# Patient Record
Sex: Female | Born: 1960
Health system: Southern US, Community
[De-identification: ages and names within clinical notes are randomized; demographics above are authoritative.]

## PROBLEM LIST (undated history)

## (undated) DIAGNOSIS — D649 Anemia, unspecified: Secondary | ICD-10-CM

## (undated) DIAGNOSIS — K829 Disease of gallbladder, unspecified: Secondary | ICD-10-CM

## (undated) DIAGNOSIS — E1165 Type 2 diabetes mellitus with hyperglycemia: Principal | ICD-10-CM

## (undated) DIAGNOSIS — T7840XA Allergy, unspecified, initial encounter: Secondary | ICD-10-CM

## (undated) DIAGNOSIS — I10 Essential (primary) hypertension: Secondary | ICD-10-CM

## (undated) DIAGNOSIS — E8881 Metabolic syndrome: Secondary | ICD-10-CM

## (undated) DIAGNOSIS — Q393 Congenital stenosis and stricture of esophagus: Secondary | ICD-10-CM

## (undated) DIAGNOSIS — D869 Sarcoidosis, unspecified: Secondary | ICD-10-CM

## (undated) DIAGNOSIS — G4730 Sleep apnea, unspecified: Secondary | ICD-10-CM

## (undated) DIAGNOSIS — M1990 Unspecified osteoarthritis, unspecified site: Secondary | ICD-10-CM

## (undated) DIAGNOSIS — F419 Anxiety disorder, unspecified: Secondary | ICD-10-CM

## (undated) DIAGNOSIS — R001 Bradycardia, unspecified: Secondary | ICD-10-CM

## (undated) DIAGNOSIS — E079 Disorder of thyroid, unspecified: Secondary | ICD-10-CM

## (undated) DIAGNOSIS — E119 Type 2 diabetes mellitus without complications: Secondary | ICD-10-CM

## (undated) DIAGNOSIS — R011 Cardiac murmur, unspecified: Secondary | ICD-10-CM

## (undated) DIAGNOSIS — F32A Depression, unspecified: Secondary | ICD-10-CM

## (undated) DIAGNOSIS — R51 Headache: Secondary | ICD-10-CM

## (undated) DIAGNOSIS — F329 Major depressive disorder, single episode, unspecified: Secondary | ICD-10-CM

## (undated) DIAGNOSIS — K649 Unspecified hemorrhoids: Secondary | ICD-10-CM

## (undated) DIAGNOSIS — K602 Anal fissure, unspecified: Secondary | ICD-10-CM

## (undated) DIAGNOSIS — K219 Gastro-esophageal reflux disease without esophagitis: Secondary | ICD-10-CM

## (undated) DIAGNOSIS — G709 Myoneural disorder, unspecified: Secondary | ICD-10-CM

## (undated) HISTORY — PX: ABDOMINAL HYSTERECTOMY: SHX81

## (undated) HISTORY — DX: Anxiety disorder, unspecified: F41.9

## (undated) HISTORY — DX: Cardiac murmur, unspecified: R01.1

## (undated) HISTORY — DX: Congenital stenosis and stricture of esophagus: Q39.3

## (undated) HISTORY — DX: Disease of gallbladder, unspecified: K82.9

## (undated) HISTORY — DX: Disorder of thyroid, unspecified: E07.9

## (undated) HISTORY — PX: UPPER GASTROINTESTINAL ENDOSCOPY: SHX188

## (undated) HISTORY — PX: CHOLECYSTECTOMY: SHX55

## (undated) HISTORY — DX: Bradycardia, unspecified: R00.1

## (undated) HISTORY — DX: Depression, unspecified: F32.A

## (undated) HISTORY — DX: Metabolic syndrome: E88.810

## (undated) HISTORY — DX: Anal fissure, unspecified: K60.2

## (undated) HISTORY — DX: Gastro-esophageal reflux disease without esophagitis: K21.9

## (undated) HISTORY — DX: Unspecified osteoarthritis, unspecified site: M19.90

## (undated) HISTORY — PX: OOPHORECTOMY: SHX86

## (undated) HISTORY — DX: Myoneural disorder, unspecified: G70.9

## (undated) HISTORY — DX: Sleep apnea, unspecified: G47.30

## (undated) HISTORY — DX: Allergy, unspecified, initial encounter: T78.40XA

## (undated) HISTORY — DX: Type 2 diabetes mellitus with hyperglycemia: E11.65

## (undated) HISTORY — DX: Type 2 diabetes mellitus without complications: E11.9

## (undated) HISTORY — DX: Headache: R51

## (undated) HISTORY — DX: Metabolic syndrome: E88.81

## (undated) HISTORY — DX: Sarcoidosis, unspecified: D86.9

## (undated) HISTORY — DX: Anemia, unspecified: D64.9

## (undated) HISTORY — DX: Major depressive disorder, single episode, unspecified: F32.9

## (undated) HISTORY — DX: Unspecified hemorrhoids: K64.9

## (undated) HISTORY — DX: Essential (primary) hypertension: I10

## (undated) HISTORY — PX: ESOPHAGEAL DILATION: SHX303

---

## 1998-04-17 ENCOUNTER — Emergency Department (HOSPITAL_COMMUNITY): Admission: EM | Admit: 1998-04-17 | Discharge: 1998-04-17 | Payer: Self-pay

## 1998-10-31 ENCOUNTER — Ambulatory Visit (HOSPITAL_COMMUNITY): Admission: RE | Admit: 1998-10-31 | Discharge: 1998-10-31 | Payer: Self-pay | Admitting: *Deleted

## 1998-11-07 ENCOUNTER — Ambulatory Visit (HOSPITAL_COMMUNITY): Admission: RE | Admit: 1998-11-07 | Discharge: 1998-11-07 | Payer: Self-pay | Admitting: *Deleted

## 1998-12-10 ENCOUNTER — Encounter: Payer: Self-pay | Admitting: *Deleted

## 1998-12-10 ENCOUNTER — Ambulatory Visit (HOSPITAL_COMMUNITY): Admission: RE | Admit: 1998-12-10 | Discharge: 1998-12-10 | Payer: Self-pay | Admitting: *Deleted

## 1998-12-24 ENCOUNTER — Other Ambulatory Visit: Admission: RE | Admit: 1998-12-24 | Discharge: 1998-12-24 | Payer: Self-pay | Admitting: Gynecology

## 1999-12-28 ENCOUNTER — Encounter (INDEPENDENT_AMBULATORY_CARE_PROVIDER_SITE_OTHER): Payer: Self-pay | Admitting: Specialist

## 1999-12-28 ENCOUNTER — Other Ambulatory Visit: Admission: RE | Admit: 1999-12-28 | Discharge: 1999-12-28 | Payer: Self-pay | Admitting: Gynecology

## 2000-06-21 ENCOUNTER — Emergency Department (HOSPITAL_COMMUNITY): Admission: EM | Admit: 2000-06-21 | Discharge: 2000-06-21 | Payer: Self-pay

## 2001-11-09 ENCOUNTER — Encounter: Payer: Self-pay | Admitting: Gynecology

## 2001-11-13 ENCOUNTER — Other Ambulatory Visit: Admission: RE | Admit: 2001-11-13 | Discharge: 2001-11-13 | Payer: Self-pay | Admitting: Gynecology

## 2001-11-16 ENCOUNTER — Inpatient Hospital Stay (HOSPITAL_COMMUNITY): Admission: RE | Admit: 2001-11-16 | Discharge: 2001-11-17 | Payer: Self-pay | Admitting: Gynecology

## 2001-11-16 ENCOUNTER — Encounter (INDEPENDENT_AMBULATORY_CARE_PROVIDER_SITE_OTHER): Payer: Self-pay | Admitting: Specialist

## 2003-04-24 ENCOUNTER — Emergency Department (HOSPITAL_COMMUNITY): Admission: EM | Admit: 2003-04-24 | Discharge: 2003-04-25 | Payer: Self-pay | Admitting: Emergency Medicine

## 2003-04-24 ENCOUNTER — Encounter: Payer: Self-pay | Admitting: Emergency Medicine

## 2003-04-24 ENCOUNTER — Emergency Department (HOSPITAL_COMMUNITY): Admission: EM | Admit: 2003-04-24 | Discharge: 2003-04-24 | Payer: Self-pay | Admitting: Emergency Medicine

## 2003-04-26 ENCOUNTER — Ambulatory Visit (HOSPITAL_COMMUNITY): Admission: RE | Admit: 2003-04-26 | Discharge: 2003-04-26 | Payer: Self-pay | Admitting: Urology

## 2004-08-31 DIAGNOSIS — E11319 Type 2 diabetes mellitus with unspecified diabetic retinopathy without macular edema: Secondary | ICD-10-CM | POA: Insufficient documentation

## 2005-04-20 ENCOUNTER — Ambulatory Visit: Payer: Self-pay | Admitting: Internal Medicine

## 2005-05-31 ENCOUNTER — Encounter: Admission: RE | Admit: 2005-05-31 | Discharge: 2005-08-29 | Payer: Self-pay | Admitting: Internal Medicine

## 2005-07-07 ENCOUNTER — Ambulatory Visit: Payer: Self-pay | Admitting: Internal Medicine

## 2005-07-19 ENCOUNTER — Ambulatory Visit: Payer: Self-pay | Admitting: Internal Medicine

## 2005-08-30 ENCOUNTER — Encounter: Admission: RE | Admit: 2005-08-30 | Discharge: 2005-11-28 | Payer: Self-pay | Admitting: Internal Medicine

## 2005-09-16 ENCOUNTER — Ambulatory Visit: Payer: Self-pay | Admitting: Internal Medicine

## 2005-12-27 ENCOUNTER — Encounter: Admission: RE | Admit: 2005-12-27 | Discharge: 2005-12-27 | Payer: Self-pay | Admitting: Internal Medicine

## 2006-06-26 ENCOUNTER — Emergency Department (HOSPITAL_COMMUNITY): Admission: EM | Admit: 2006-06-26 | Discharge: 2006-06-26 | Payer: Self-pay | Admitting: Emergency Medicine

## 2006-07-01 ENCOUNTER — Ambulatory Visit: Payer: Self-pay | Admitting: Internal Medicine

## 2006-08-15 ENCOUNTER — Ambulatory Visit (HOSPITAL_BASED_OUTPATIENT_CLINIC_OR_DEPARTMENT_OTHER): Admission: RE | Admit: 2006-08-15 | Discharge: 2006-08-15 | Payer: Self-pay | Admitting: Psychiatry

## 2006-08-21 ENCOUNTER — Ambulatory Visit: Payer: Self-pay | Admitting: Internal Medicine

## 2006-09-13 HISTORY — PX: COLONOSCOPY: SHX174

## 2006-10-18 ENCOUNTER — Ambulatory Visit: Payer: Self-pay | Admitting: Internal Medicine

## 2006-10-21 ENCOUNTER — Ambulatory Visit: Payer: Self-pay | Admitting: Gastroenterology

## 2006-10-26 ENCOUNTER — Ambulatory Visit: Payer: Self-pay | Admitting: Gastroenterology

## 2006-11-28 ENCOUNTER — Ambulatory Visit: Payer: Self-pay | Admitting: Gastroenterology

## 2007-02-09 DIAGNOSIS — I10 Essential (primary) hypertension: Secondary | ICD-10-CM | POA: Insufficient documentation

## 2007-02-09 DIAGNOSIS — Z9079 Acquired absence of other genital organ(s): Secondary | ICD-10-CM | POA: Insufficient documentation

## 2007-02-21 ENCOUNTER — Observation Stay (HOSPITAL_COMMUNITY): Admission: EM | Admit: 2007-02-21 | Discharge: 2007-02-22 | Payer: Self-pay | Admitting: Emergency Medicine

## 2007-02-21 ENCOUNTER — Encounter: Payer: Self-pay | Admitting: Internal Medicine

## 2007-02-22 ENCOUNTER — Ambulatory Visit: Payer: Self-pay | Admitting: Internal Medicine

## 2007-02-22 ENCOUNTER — Other Ambulatory Visit: Payer: Self-pay | Admitting: Internal Medicine

## 2007-02-22 ENCOUNTER — Encounter: Payer: Self-pay | Admitting: Internal Medicine

## 2007-03-22 ENCOUNTER — Ambulatory Visit: Payer: Self-pay | Admitting: Gastroenterology

## 2007-03-23 ENCOUNTER — Telehealth (INDEPENDENT_AMBULATORY_CARE_PROVIDER_SITE_OTHER): Payer: Self-pay | Admitting: *Deleted

## 2007-04-11 ENCOUNTER — Ambulatory Visit: Payer: Self-pay | Admitting: Internal Medicine

## 2007-05-25 ENCOUNTER — Ambulatory Visit: Payer: Self-pay | Admitting: Internal Medicine

## 2007-05-28 LAB — CONVERTED CEMR LAB
AST: 17 units/L (ref 0–37)
Basophils Absolute: 0 10*3/uL (ref 0.0–0.1)
Bilirubin, Direct: 0.1 mg/dL (ref 0.0–0.3)
CO2: 25 meq/L (ref 19–32)
Chloride: 105 meq/L (ref 96–112)
Cholesterol: 155 mg/dL (ref 0–200)
Creatinine, Ser: 1 mg/dL (ref 0.4–1.2)
Creatinine,U: 239.8 mg/dL
Eosinophils Relative: 2.7 % (ref 0.0–5.0)
Glucose, Bld: 105 mg/dL — ABNORMAL HIGH (ref 70–99)
HCT: 36.1 % (ref 36.0–46.0)
Hemoglobin: 12.5 g/dL (ref 12.0–15.0)
LDL Cholesterol: 97 mg/dL (ref 0–99)
MCHC: 34.7 g/dL (ref 30.0–36.0)
Microalb, Ur: 0.7 mg/dL (ref 0.0–1.9)
Monocytes Absolute: 0.7 10*3/uL (ref 0.2–0.7)
Neutrophils Relative %: 62.6 % (ref 43.0–77.0)
Potassium: 3 meq/L — ABNORMAL LOW (ref 3.5–5.1)
RBC: 4.18 M/uL (ref 3.87–5.11)
RDW: 12.6 % (ref 11.5–14.6)
Sodium: 138 meq/L (ref 135–145)
TSH: 1.42 microintl units/mL (ref 0.35–5.50)
Total Bilirubin: 0.6 mg/dL (ref 0.3–1.2)
Total CHOL/HDL Ratio: 4.2
Total Protein: 7.7 g/dL (ref 6.0–8.3)
WBC: 10.2 10*3/uL (ref 4.5–10.5)

## 2007-05-30 ENCOUNTER — Ambulatory Visit: Payer: Self-pay | Admitting: Internal Medicine

## 2007-05-30 DIAGNOSIS — M2550 Pain in unspecified joint: Secondary | ICD-10-CM | POA: Insufficient documentation

## 2007-06-30 ENCOUNTER — Telehealth (INDEPENDENT_AMBULATORY_CARE_PROVIDER_SITE_OTHER): Payer: Self-pay | Admitting: *Deleted

## 2007-07-03 ENCOUNTER — Telehealth (INDEPENDENT_AMBULATORY_CARE_PROVIDER_SITE_OTHER): Payer: Self-pay | Admitting: *Deleted

## 2007-07-12 ENCOUNTER — Telehealth (INDEPENDENT_AMBULATORY_CARE_PROVIDER_SITE_OTHER): Payer: Self-pay | Admitting: *Deleted

## 2007-09-01 ENCOUNTER — Ambulatory Visit: Payer: Self-pay | Admitting: Internal Medicine

## 2007-09-01 DIAGNOSIS — IMO0001 Reserved for inherently not codable concepts without codable children: Secondary | ICD-10-CM

## 2007-09-01 DIAGNOSIS — R5381 Other malaise: Secondary | ICD-10-CM | POA: Insufficient documentation

## 2007-09-01 DIAGNOSIS — R5383 Other fatigue: Secondary | ICD-10-CM

## 2007-09-01 HISTORY — DX: Reserved for inherently not codable concepts without codable children: IMO0001

## 2007-09-09 LAB — CONVERTED CEMR LAB
Basophils Relative: 0.9 % (ref 0.0–1.0)
Eosinophils Absolute: 0.3 10*3/uL (ref 0.0–0.6)
Hemoglobin: 13 g/dL (ref 12.0–15.0)
Hgb A1c MFr Bld: 5.8 % (ref 4.6–6.0)
Lymphocytes Relative: 31.9 % (ref 12.0–46.0)
MCHC: 34.6 g/dL (ref 30.0–36.0)
MCV: 87.6 fL (ref 78.0–100.0)
Monocytes Absolute: 0.5 10*3/uL (ref 0.2–0.7)
Monocytes Relative: 5.3 % (ref 3.0–11.0)
Neutro Abs: 5.4 10*3/uL (ref 1.4–7.7)
Platelets: 392 10*3/uL (ref 150–400)

## 2007-09-11 ENCOUNTER — Encounter (INDEPENDENT_AMBULATORY_CARE_PROVIDER_SITE_OTHER): Payer: Self-pay | Admitting: *Deleted

## 2007-09-18 ENCOUNTER — Ambulatory Visit: Payer: Self-pay | Admitting: Vascular Surgery

## 2007-09-18 ENCOUNTER — Ambulatory Visit: Admission: RE | Admit: 2007-09-18 | Discharge: 2007-09-18 | Payer: Self-pay | Admitting: Family Medicine

## 2007-10-09 ENCOUNTER — Ambulatory Visit: Payer: Self-pay | Admitting: Internal Medicine

## 2008-01-13 DIAGNOSIS — Z8679 Personal history of other diseases of the circulatory system: Secondary | ICD-10-CM | POA: Insufficient documentation

## 2008-01-13 DIAGNOSIS — Z87442 Personal history of urinary calculi: Secondary | ICD-10-CM | POA: Insufficient documentation

## 2008-01-13 DIAGNOSIS — K602 Anal fissure, unspecified: Secondary | ICD-10-CM | POA: Insufficient documentation

## 2008-01-13 DIAGNOSIS — K219 Gastro-esophageal reflux disease without esophagitis: Secondary | ICD-10-CM | POA: Insufficient documentation

## 2008-01-13 DIAGNOSIS — G43909 Migraine, unspecified, not intractable, without status migrainosus: Secondary | ICD-10-CM | POA: Insufficient documentation

## 2008-04-09 ENCOUNTER — Emergency Department (HOSPITAL_COMMUNITY): Admission: EM | Admit: 2008-04-09 | Discharge: 2008-04-09 | Payer: Self-pay | Admitting: Emergency Medicine

## 2008-04-09 ENCOUNTER — Encounter: Payer: Self-pay | Admitting: Internal Medicine

## 2008-04-10 ENCOUNTER — Ambulatory Visit: Payer: Self-pay | Admitting: Internal Medicine

## 2008-04-10 DIAGNOSIS — H543 Unqualified visual loss, both eyes: Secondary | ICD-10-CM | POA: Insufficient documentation

## 2008-04-10 DIAGNOSIS — R519 Headache, unspecified: Secondary | ICD-10-CM | POA: Insufficient documentation

## 2008-04-10 DIAGNOSIS — IMO0001 Reserved for inherently not codable concepts without codable children: Secondary | ICD-10-CM | POA: Insufficient documentation

## 2008-04-10 DIAGNOSIS — R51 Headache: Secondary | ICD-10-CM | POA: Insufficient documentation

## 2008-04-10 DIAGNOSIS — R748 Abnormal levels of other serum enzymes: Secondary | ICD-10-CM | POA: Insufficient documentation

## 2008-04-10 DIAGNOSIS — R42 Dizziness and giddiness: Secondary | ICD-10-CM | POA: Insufficient documentation

## 2008-04-10 DIAGNOSIS — R4789 Other speech disturbances: Secondary | ICD-10-CM | POA: Insufficient documentation

## 2008-04-11 ENCOUNTER — Encounter (INDEPENDENT_AMBULATORY_CARE_PROVIDER_SITE_OTHER): Payer: Self-pay | Admitting: *Deleted

## 2008-04-12 ENCOUNTER — Encounter (INDEPENDENT_AMBULATORY_CARE_PROVIDER_SITE_OTHER): Payer: Self-pay | Admitting: *Deleted

## 2008-04-12 ENCOUNTER — Telehealth (INDEPENDENT_AMBULATORY_CARE_PROVIDER_SITE_OTHER): Payer: Self-pay | Admitting: *Deleted

## 2008-04-12 ENCOUNTER — Encounter: Payer: Self-pay | Admitting: Internal Medicine

## 2008-04-12 LAB — CONVERTED CEMR LAB
Creatinine, Ser: 1 mg/dL (ref 0.4–1.2)
Rhuematoid fact SerPl-aCnc: <20 intl units/mL — ABNORMAL LOW (ref 0.0–20.0)
Sed Rate: 35 mm/hr — ABNORMAL HIGH (ref 0–22)

## 2008-04-14 ENCOUNTER — Encounter: Admission: RE | Admit: 2008-04-14 | Discharge: 2008-04-14 | Payer: Self-pay | Admitting: Internal Medicine

## 2008-04-14 ENCOUNTER — Encounter: Payer: Self-pay | Admitting: Internal Medicine

## 2008-04-16 ENCOUNTER — Encounter: Payer: Self-pay | Admitting: Internal Medicine

## 2008-04-19 ENCOUNTER — Telehealth (INDEPENDENT_AMBULATORY_CARE_PROVIDER_SITE_OTHER): Payer: Self-pay | Admitting: *Deleted

## 2008-04-29 ENCOUNTER — Telehealth (INDEPENDENT_AMBULATORY_CARE_PROVIDER_SITE_OTHER): Payer: Self-pay | Admitting: *Deleted

## 2008-05-16 ENCOUNTER — Encounter: Payer: Self-pay | Admitting: Internal Medicine

## 2008-06-04 ENCOUNTER — Encounter: Payer: Self-pay | Admitting: Internal Medicine

## 2008-06-14 ENCOUNTER — Encounter: Payer: Self-pay | Admitting: Internal Medicine

## 2008-07-08 ENCOUNTER — Encounter: Payer: Self-pay | Admitting: Internal Medicine

## 2008-09-17 ENCOUNTER — Encounter: Payer: Self-pay | Admitting: Internal Medicine

## 2009-02-06 ENCOUNTER — Ambulatory Visit: Payer: Self-pay | Admitting: Internal Medicine

## 2009-02-11 ENCOUNTER — Ambulatory Visit: Payer: Self-pay | Admitting: Internal Medicine

## 2009-02-12 LAB — CONVERTED CEMR LAB
Basophils Absolute: 0.2 10*3/uL — ABNORMAL HIGH (ref 0.0–0.1)
Eosinophils Relative: 4.6 % (ref 0.0–5.0)
Hemoglobin: 13.2 g/dL (ref 12.0–15.0)
Hgb A1c MFr Bld: 6.7 % — ABNORMAL HIGH (ref 4.6–6.5)
Lymphocytes Relative: 35.9 % (ref 12.0–46.0)
Microalb, Ur: 0.7 mg/dL (ref 0.0–1.9)
Monocytes Relative: 5.6 % (ref 3.0–12.0)
Platelets: 342 10*3/uL (ref 150.0–400.0)
RDW: 13 % (ref 11.5–14.6)
Total CK: 161 units/L (ref 7–177)
WBC: 9.6 10*3/uL (ref 4.5–10.5)

## 2009-02-13 ENCOUNTER — Encounter (INDEPENDENT_AMBULATORY_CARE_PROVIDER_SITE_OTHER): Payer: Self-pay | Admitting: *Deleted

## 2009-03-27 ENCOUNTER — Ambulatory Visit: Payer: Self-pay | Admitting: Internal Medicine

## 2009-04-04 ENCOUNTER — Encounter (INDEPENDENT_AMBULATORY_CARE_PROVIDER_SITE_OTHER): Payer: Self-pay | Admitting: *Deleted

## 2009-04-17 ENCOUNTER — Telehealth (INDEPENDENT_AMBULATORY_CARE_PROVIDER_SITE_OTHER): Payer: Self-pay | Admitting: *Deleted

## 2009-04-23 ENCOUNTER — Encounter: Payer: Self-pay | Admitting: Internal Medicine

## 2009-05-16 ENCOUNTER — Ambulatory Visit: Payer: Self-pay | Admitting: Internal Medicine

## 2009-06-23 ENCOUNTER — Ambulatory Visit: Payer: Self-pay | Admitting: Internal Medicine

## 2009-07-02 ENCOUNTER — Encounter: Payer: Self-pay | Admitting: Internal Medicine

## 2009-09-26 ENCOUNTER — Telehealth (INDEPENDENT_AMBULATORY_CARE_PROVIDER_SITE_OTHER): Payer: Self-pay | Admitting: *Deleted

## 2009-11-27 ENCOUNTER — Telehealth: Payer: Self-pay | Admitting: Family Medicine

## 2010-03-10 ENCOUNTER — Telehealth (INDEPENDENT_AMBULATORY_CARE_PROVIDER_SITE_OTHER): Payer: Self-pay | Admitting: *Deleted

## 2010-03-11 ENCOUNTER — Encounter (INDEPENDENT_AMBULATORY_CARE_PROVIDER_SITE_OTHER): Payer: Self-pay | Admitting: *Deleted

## 2010-03-20 ENCOUNTER — Ambulatory Visit: Payer: Self-pay | Admitting: Internal Medicine

## 2010-03-20 ENCOUNTER — Encounter: Payer: Self-pay | Admitting: Family Medicine

## 2010-03-20 DIAGNOSIS — R1319 Other dysphagia: Secondary | ICD-10-CM | POA: Insufficient documentation

## 2010-03-27 ENCOUNTER — Ambulatory Visit: Payer: Self-pay | Admitting: Internal Medicine

## 2010-03-30 LAB — CONVERTED CEMR LAB
ALT: 33 units/L (ref 0–35)
BUN: 17 mg/dL (ref 6–23)
Bilirubin, Direct: 0.1 mg/dL (ref 0.0–0.3)
Calcium: 8.6 mg/dL (ref 8.4–10.5)
Cholesterol: 151 mg/dL (ref 0–200)
Creatinine, Ser: 0.9 mg/dL (ref 0.4–1.2)
Eosinophils Relative: 2.9 % (ref 0.0–5.0)
GFR calc non Af Amer: 83.54 mL/min (ref >60)
HDL: 56.4 mg/dL (ref >39.00)
Hgb A1c MFr Bld: 7.7 % — ABNORMAL HIGH (ref 4.6–6.5)
LDL Cholesterol: 80 mg/dL (ref 0–99)
MCV: 87.6 fL (ref 78.0–100.0)
Microalb Creat Ratio: 2.3 mg/g (ref 0.0–30.0)
Monocytes Absolute: 0.5 10*3/uL (ref 0.1–1.0)
Monocytes Relative: 5.2 % (ref 3.0–12.0)
Neutrophils Relative %: 59.8 % (ref 43.0–77.0)
Platelets: 341 10*3/uL (ref 150.0–400.0)
Total Bilirubin: 0.4 mg/dL (ref 0.3–1.2)
Triglycerides: 74 mg/dL (ref 0.0–149.0)
VLDL: 14.8 mg/dL (ref 0.0–40.0)
WBC: 9.9 10*3/uL (ref 4.5–10.5)

## 2010-04-08 ENCOUNTER — Telehealth (INDEPENDENT_AMBULATORY_CARE_PROVIDER_SITE_OTHER): Payer: Self-pay | Admitting: *Deleted

## 2010-04-13 ENCOUNTER — Telehealth (INDEPENDENT_AMBULATORY_CARE_PROVIDER_SITE_OTHER): Payer: Self-pay | Admitting: *Deleted

## 2010-04-15 ENCOUNTER — Telehealth: Payer: Self-pay | Admitting: Internal Medicine

## 2010-04-16 ENCOUNTER — Encounter: Payer: Self-pay | Admitting: Internal Medicine

## 2010-05-07 ENCOUNTER — Encounter: Payer: Self-pay | Admitting: Internal Medicine

## 2010-08-05 ENCOUNTER — Telehealth (INDEPENDENT_AMBULATORY_CARE_PROVIDER_SITE_OTHER): Payer: Self-pay | Admitting: *Deleted

## 2010-10-05 ENCOUNTER — Telehealth (INDEPENDENT_AMBULATORY_CARE_PROVIDER_SITE_OTHER): Payer: Self-pay | Admitting: *Deleted

## 2010-10-13 NOTE — Progress Notes (Signed)
Summary: Refill Request  Phone Note Refill Request Message from:  Pharmacy on [facility] Fax #: [phone]  Refills Requested: Medication #1:  GABAPENTIN 100 MG CAPS 1 q 8 hrs as needed for pain   Dosage confirmed as above?Dosage Confirmed   Supply Requested: 1 month   Last Refilled: 02/06/2009  Medication #2:  CARTIA XT 240 MG CP24 1 capsule daily   Dosage confirmed as above?Dosage Confirmed   Brand Name Necessary? No   Supply Requested: 1 month   Last Refilled: 09/25/2009 Next Appointment Scheduled: none Initial call taken by: Harold Barban,  November 27, 2009 9:09 AM  Follow-up for Phone Call        Saw [facility] on 10/11 for knee pian. Army Fossa CMA  November 27, 2009 9:13 AM   Additional Follow-up for Phone Call Additional follow up Details #1::        ok to refill both x1  2 refills Additional Follow-up by: Loreen Freud DO,  November 27, 2009 9:33 AM    Prescriptions: GABAPENTIN 100 MG CAPS (GABAPENTIN) 1 q 8 hrs as needed for pain  #30 x 0   Entered by:   Army Fossa CMA   Authorized by:   Loreen Freud DO   Signed by:   Army Fossa CMA on 11/27/2009   Method used:   Electronically to        [facility]* (retail)       [address]       [address] Shipping/mailing       [place], [place]  [address]       Ph: [phone]       Fax: [phone]   RxID:   [id number] CARTIA XT 240 MG CP24 (DILTIAZEM HCL COATED BEADS) 1 capsule daily  #30 x 0   Entered by:   Army Fossa CMA   Authorized by:   Loreen Freud DO   Signed by:   Army Fossa CMA on 11/27/2009   Method used:   Electronically to        [facility]* (retail)       [address]       [address] Shipping/mailing       [place], [place]  [address]       Ph: [phone]       Fax: [phone]   RxID:   [id number]

## 2010-10-13 NOTE — Progress Notes (Signed)
Summary: refill   Phone Note Refill Request   Refills Requested: Medication #1:  METFORMIN HCL 500 MG  TB24 2 by mouth qhs   Last Refilled: 06/2007 [facility]  Initial call taken by: Army Fossa CMA,  September 26, 2009 9:42 AM    Prescriptions: METFORMIN HCL 500 MG  TB24 (METFORMIN HCL) 2 by mouth qhs  #180 x 1   Entered by:   Shonna Chock   Authorized by:   Marga Melnick MD   Signed by:   Shonna Chock on 09/26/2009   Method used:   Electronically to        [facility]* (retail)       [address]       [address] [place] Shipping/mailing       [place], [place]  [address]       Ph: [phone]       Fax: [phone]   RxID:   [id number]

## 2010-10-13 NOTE — Progress Notes (Signed)
Summary: refill  Phone Note Refill Request Message from:  Fax from Pharmacy on March 10, 2010 4:11 PM  Refills Requested: Medication #1:  DILTIAZEM 24hr er 240 mg ca take 1 capsule by mouth once daily  Medication #2:  LISINOPRIL 20 MG  TABS 1qd [place] [facility] - fax [phone]  Initial call taken by: Okey Regal [place],  March 10, 2010 4:13 PM    Prescriptions: LISINOPRIL 20 MG  TABS (LISINOPRIL) 1qd  #90 Tablet x 0   Entered by:   Jeremy Johann CMA   Authorized by:   Loreen Freud DO   Signed by:   Jeremy Johann CMA on 03/11/2010   Method used:   Faxed to ...       [facility]* (retail)       [address]       [address] Shipping/mailing       [place], [place]  [address]       Ph: [phone]       Fax: [phone]   RxID:   [id number] CARTIA XT 240 MG CP24 (DILTIAZEM HCL COATED BEADS) 1 capsule daily  #30 x 2   Entered by:   Jeremy Johann CMA   Authorized by:   Loreen Freud DO   Signed by:   Jeremy Johann CMA on 03/11/2010   Method used:   Faxed to ...       [facility]* (retail)       [address]       [address] Shipping/mailing       [place], [place]  [address]       Ph: [phone]       Fax: [phone]   RxID:   [id number]

## 2010-10-13 NOTE — Progress Notes (Signed)
Summary: Refill Request  Phone Note Refill Request Call back at [phone] Message from:  Pharmacy on August 05, 2010 8:23 AM  Refills Requested: Medication #1:  CARTIA XT 240 MG CP24 1 capsule daily   Dosage confirmed as above?Dosage Confirmed   Brand Name Necessary? No   Supply Requested: 1 month   Last Refilled: 03/11/2010 [facility]  Next Appointment Scheduled: none Initial call taken by: Harold Barban,  August 05, 2010 8:24 AM    Prescriptions: CARTIA XT 240 MG CP24 (DILTIAZEM HCL COATED BEADS) 1 capsule daily  #30 x 5   Entered by:   Shonna Chock CMA   Authorized by:   Marga Melnick MD   Signed by:   Shonna Chock CMA on 08/05/2010   Method used:   Electronically to        [facility]* (retail)       [address]       [address] [place] Shipping/mailing       [place], [place]  [address]       Ph: [phone]       Fax: [phone]   RxID:   [id number]

## 2010-10-13 NOTE — Progress Notes (Signed)
Summary: Side Effect  Phone Note Call from Patient Call back at Home Phone [phone]   Caller: Patient Summary of Call: Patient called and was questioning her new rx for Metformin she recieved in the mail. She says that the metformin causes rectal tears and she is concerened with taking that many pills. Please advise.  Initial call taken by: Harold Barban,  April 08, 2010 9:15 AM  Follow-up for Phone Call        change to Januvia 100 mg once daily #30 , Rx2. remove metformin  Follow-up by: Marga Melnick MD,  April 08, 2010 2:35 PM  Additional Follow-up for Phone Call Additional follow up Details #1::        Left message on voicemail to call back to office. Lucious Groves CMA  April 08, 2010 3:12 PM   left message on voicemail to call back to office. Lucious Groves CMA  April 09, 2010 3:30 PM    New Allergies: ! METFORMIN HCL (METFORMIN HCL) Additional Follow-up for Phone Call Additional follow up Details #2::    Spoke with patient, patient ok'd all information./[facility] CMA  April 09, 2010 4:42 PM   New/Updated Medications: JANUVIA 100 MG TABS (SITAGLIPTIN PHOSPHATE) 1 by mouth once daily New Allergies: ! METFORMIN HCL (METFORMIN HCL)Prescriptions: JANUVIA 100 MG TABS (SITAGLIPTIN PHOSPHATE) 1 by mouth once daily  #30 x 2   Entered by:   Shonna Chock CMA   Authorized by:   Marga Melnick MD   Signed by:   Shonna Chock CMA on 04/09/2010   Method used:   Electronically to        [facility]* (retail)       [address]       [address] [place] Shipping/mailing       [place], [place]  [address]       Ph: [phone]       Fax: [phone]   RxID:   [id number]

## 2010-10-13 NOTE — Letter (Signed)
Summary: Sheila Ferguson  [facility]   Imported By: Lanelle Bal 05/19/2010 11:06:10  _____________________________________________________________________  External Attachment:    Type:   Image     Comment:   External Document

## 2010-10-13 NOTE — Letter (Signed)
Summary: Primary Care Appointment Letter  [facility]  [address] [place], [place] [address]   Phone: [phone]  Fax: [phone]    03/11/2010 MRN: [id number]  [facility] [place] [address] [place], [place]  [address]  Dear Ms. Binford,   Your Primary Care Physician Loreen Freud DO has indicated that:    __X_____it is time to schedule an appointment.    _______you missed your appointment on______ and need to call and          reschedule.    _______you need to have lab work done.    _______you need to schedule an appointment discuss lab or test results.    _______you need to call to reschedule your appointment that is                       scheduled on _________.     Please call our office as soon as possible. Our phone number is [phone]. Please press option 1. Our office is open 8a-12noon and 1p-5p, Monday through Friday.     Thank you,    [facility]

## 2010-10-13 NOTE — Progress Notes (Signed)
Summary: Blood Sugar/Januvia  Phone Note Call from Patient Call back at Work Phone [phone]   Summary of Call: Patient left message on triage that she is calling about her medication and did not say anything further.  Initial call taken by: Lucious Groves CMA,  April 15, 2010 12:06 PM  Follow-up for Phone Call        Number patient gave goes straight to ER dept vm, (x2), will try again later. Lucious Groves CMA  April 15, 2010 12:21 PM   Patient has kept track of her sugar and feels that she does better with Metformin. Patient sugar is very high in the Am (265) and patient did confirm that she just started the 1/2 pill of Januvia. Patient is wondering if she should take another 1/2 pill in the PM please advise. Lucious Groves CMA  April 15, 2010 4:28 PM   Additional Follow-up for Phone Call Additional follow up Details #1::        Patient notified that per MD, it is ok to take Januvia  1/2 by mouth two times a day and will re-check A1c in 10 weeks.   Patient will fax me CBG log in the AM from work.  Additional Follow-up by: Lucious Groves CMA,  April 15, 2010 4:56 PM

## 2010-10-13 NOTE — Progress Notes (Signed)
Summary: Side Effects  Phone Note Call from Patient Call back at Home Phone [phone]   Caller: Patient Summary of Call: Patient called and LM on triage VM stating she needed a cal back about her new rx. Will call patient back.  Patient started the Januvia and has been waking up with migranes each morning. Once she gets out of bed it gets better, but presure is still around all day. Last night she got dizzy and lightheaded. She said her sugar has been great in the mornings. She wants to know how long she needs to keep trying the medicine before she should stop because of the side effects, will her body adjust? Initial call taken by: Harold Barban,  April 13, 2010 11:25 AM  Follow-up for Phone Call        Cut in in 1/2 & monitor sugars Follow-up by: Marga Melnick MD,  April 13, 2010 1:16 PM  Additional Follow-up for Phone Call Additional follow up Details #1::        Called 508 number x 2, call would not go through. I called work number and left message for patient to return call Additional Follow-up by: Shonna Chock CMA,  April 13, 2010 1:35 PM    Additional Follow-up for Phone Call Additional follow up Details #2::    Spoke with patient, patient aware to 1/2 med and to call back if symptoms continue. Patient noted that she is certain that the Januvia is causing Headaches b/c she [facility] and she has not had headaches in a long time. Patient aware to call if needed Follow-up by: Shonna Chock CMA,  April 13, 2010 2:56 PM  New/Updated Medications: JANUVIA 100 MG TABS (SITAGLIPTIN PHOSPHATE) 1/2 by mouth once daily

## 2010-10-13 NOTE — Letter (Signed)
Summary: Glucose Log from Patient  Glucose Log from Patient   Imported By: Lanelle Bal 04/23/2010 11:36:21  _____________________________________________________________________  External Attachment:    Type:   Image     Comment:   External Document

## 2010-10-13 NOTE — Assessment & Plan Note (Signed)
Summary: cpx,fasting//fd   Vital Signs:  Patient profile:   50 year old female Height:      63.75 inches Weight:      294.2 pounds BMI:     51.08 Temp:     98.5 degrees F oral Pulse rate:   88 / minute Resp:     16 per minute BP sitting:   118 / 72  (left arm) Cuff size:   large  Vitals Entered By: Shonna Chock (March 20, 2010 11:56 AM)  CC: CPX with fasting labs and refill Glimepiride, General Medical Evaluation, Type 2 diabetes mellitus follow-up Comments REVIEWED MED LIST, PATIENT AGREED DOSE AND INSTRUCTION CORRECT    CC:  CPX with fasting labs and refill Glimepiride, General Medical Evaluation, and Type 2 diabetes mellitus follow-up.  History of Present Illness: [name] is here for a physical; she is asymptomatic. Hypertension Follow-Up      This is a 50 year old woman who presents for Hypertension follow-up.  The patient reports urinary frequency and fatigue, but denies lightheadedness,significant  headaches, and edema.  The patient denies the following associated symptoms: chest pain, chest pressure, exercise intolerance, dyspnea, palpitations, syncope, leg edema, and pedal edema.  Compliance with medications (by patient report) has been near 100%.  Adjunctive measures currently used by the patient include salt restriction.   Type 2 Diabetes Mellitus Follow-Up      The patient is also here for Type 2 diabetes mellitus follow-up.  The patient reports polyuria, blurred vision, weight gain of 20#, and tingling w/o numbness of feet, but denies polydipsia and hypoglycemia.  The patient denies the following symptoms: neuropathic pain, vomiting, orthostatic symptoms, poor wound healing, intermittent claudication, vision loss, and foot ulcer.  Since the last visit the patient reports monitoring blood glucose.  The patient has been measuring capillary blood glucose before breakfast, after breakfast, and  lunch.  FBS 180-200.Highest 2 post meal = < 170.Since the last visit, the patient  reports having had no eye care and foot care by a podiatrist.    Preventive Screening-Counseling & Management  Alcohol-Tobacco     Smoking Status: never  Caffeine-Diet-Exercise     Does Patient Exercise: no  Allergies: 1)  ! Zithromax 2)  * Hctz  Past History:  Past Medical History: Diabetes mellitus, type II Hypertension Sleep Apnea on CPAP; congenital esoph stricture(no treatment until 1998) disorder,dysmetabolic syndrome [facility], [facility] Sarcoid , Ophth & Derm , PMH of  Past Surgical History: Oophorectomy-right for Endometriosis Cholecystectomy Hysterectomy for fibroids Esophagus  dilation X 2  for congenital stricture  Family History: Father: Sarcoidosis, leukemia Mother: HTN, DM,MI,cns aneurysm Siblings:sisters: HTN, DM, sleep apnea  Social History: no diet Occupation:Coder Never Smoked Alcohol use-no Regular exercise-no  Review of Systems       The patient complains of decreased hearing.  The patient denies anorexia, fever, hoarseness, prolonged cough, hemoptysis, abdominal pain, melena, hematochezia, severe indigestion/heartburn, hematuria, incontinence, suspicious skin lesions, depression, abnormal bleeding, enlarged lymph nodes, and angioedema.         Difficulty swallowing  frequently  Physical Exam  General:  in no acute distress; alert,appropriate and cooperative throughout examination;overweight-appearing.   Head:  Normocephalic and atraumatic without obvious abnormalities.  Eyes:  No corneal or conjunctival inflammation noted.Perrla. Funduscopic exam benign, without hemorrhages, exudates or papilledema.  Ears:  External ear exam shows no significant lesions or deformities.  Otoscopic examination reveals clear canals, tympanic membranes are intact bilaterally without bulging, retraction, inflammation or discharge. Hearing is grossly  normal bilaterally. Nose:  External nasal examination shows no deformity or inflammation. Nasal  mucosa are pink and moist without lesions or exudates. Mouth:  Oral mucosa and oropharynx without lesions or exudates.  Teeth in good repair. Neck:  No deformities, masses, or tenderness noted. Lungs:  Normal respiratory effort, chest expands symmetrically. Lungs are clear to auscultation, no crackles or wheezes. Heart:  Normal rate and regular rhythm. S1 and S2 normal without gallop, murmur, click, rub .S4 Abdomen:  Bowel sounds positive,abdomen soft and non-tender without masses, organomegaly or hernias noted. Striae Msk:  No deformity or scoliosis noted of thoracic or lumbar spine.   Pulses:  R and L carotid,radial,dorsalis pedis and posterior tibial pulses are full and equal bilaterally Extremities:  No clubbing, cyanosis, edema, or deformity noted. Crepitus of knees Neurologic:  alert & oriented X3 and DTRs symmetrical but 0-1/2+  Skin:  Intact without suspicious lesions or rashes Cervical Nodes:  No lymphadenopathy noted Axillary Nodes:  No palpable lymphadenopathy Psych:  memory intact for recent and remote, normally interactive, and good eye contact.     Impression & Recommendations:  Problem # 1:  PREVENTIVE HEALTH CARE (ICD-V70.0)  Orders: EKG w/ Interpretation (93000) Venipuncture (14431) TLB-Lipid Panel (80061-LIPID) TLB-BMP (Basic Metabolic Panel-BMET) (80048-METABOL) TLB-CBC Platelet - w/Differential (85025-CBCD) TLB-Hepatic/Liver Function Pnl (80076-HEPATIC) TLB-TSH (Thyroid Stimulating Hormone) (84443-TSH) TLB-A1C / Hgb A1C (Glycohemoglobin) (83036-A1C) TLB-Microalbumin/Creat Ratio, Urine (82043-MALB)  Problem # 2:  OTHER DYSPHAGIA (VQM-086.76)  PMH of congenital esophageal stricture  Orders: Venipuncture (19509) TLB-CBC Platelet - w/Differential (85025-CBCD)  Problem # 3:  DIABETES MELLITUS, TYPE II, UNCONTROLLED (ICD-250.02)  Her updated medication list for this problem includes:    Metformin Hcl 500 Mg Tb24 (Metformin hcl) .Marland Kitchen... 2 by mouth qhs     Glimepiride 4 Mg Tabs (Glimepiride) .Marland Kitchen... 1/2 once daily    Lisinopril 20 Mg Tabs (Lisinopril) .Marland Kitchen... 1qd    Bayer Aspirin 325 Mg Tabs (Aspirin) .Marland Kitchen... 1 by mouth once daily  Orders: Venipuncture (32671) TLB-A1C / Hgb A1C (Glycohemoglobin) (83036-A1C) TLB-Microalbumin/Creat Ratio, Urine (82043-MALB)  Problem # 4:  HYPERTENSION (ICD-401.9)  Her updated medication list for this problem includes:    Cartia Xt 240 Mg Cp24 (Diltiazem hcl coated beads) .Marland Kitchen... 1 capsule daily    Lisinopril 20 Mg Tabs (Lisinopril) .Marland Kitchen... 1qd  Orders: EKG w/ Interpretation (93000) Venipuncture (24580)  Complete Medication List: 1)  Cartia Xt 240 Mg Cp24 (Diltiazem hcl coated beads) .Marland Kitchen.. 1 capsule daily 2)  Metformin Hcl 500 Mg Tb24 (Metformin hcl) .... 2 by mouth qhs 3)  Glimepiride 4 Mg Tabs (Glimepiride) .... 1/2 once daily 4)  Topamax 100 Mg Tabs (Topiramate) .Marland Kitchen.. 1 by mouth qhs 5)  All Day Allergy 10 Mg Tabs (Cetirizine hcl) .Marland Kitchen.. 1 by mouth once daily 6)  Lisinopril 20 Mg Tabs (Lisinopril) .Marland Kitchen.. 1qd 7)  Bayer Aspirin 325 Mg Tabs (Aspirin) .Marland Kitchen.. 1 by mouth once daily 8)  Gabapentin 100 Mg Caps (Gabapentin) .Marland Kitchen.. 1 q 8 hrs as needed for pain 9)  Ibuprofen 200 Mg Caps (Ibuprofen) .Marland Kitchen.. 1 by mouth as needed 10)  Lortab 7.5-500 Mg Tabs (Hydrocodone-acetaminophen) .Marland Kitchen.. 1 by mouth every 4-6 hours as needed 11)  Vitamin D3 1000 Unit Tabs (Cholecalciferol) .... 2 by mouth once daily 12)  Vitamin B-12 5000 Mcg Subl (Cyanocobalamin) .Marland Kitchen.. 1 by mouth once daily 13)  Vit B12 Sublingual Spray  .... As needed 14)  Alive Womens 50+  .Marland Kitchen.. 1 by mouth once daily  Patient Instructions: 1)  Consume < 30 grams of sugar /  day from High Fructose Corn Syrup sources . Complete stool cards. Please consider repeating the Endoscopy due to the dysphagia as discussed.Avoid foods high in acid (tomatoes, citrus juices, spicy foods). Avoid eating within two hours of lying down or before exercising. Do not over eat; try smaller more frequent  meals. Elevate head of bed twelve inches when sleeping. Prescriptions: GLIMEPIRIDE 4 MG  TABS (GLIMEPIRIDE) 1/2 once daily  #90 Each x 0   Entered and Authorized by:   Marga Melnick MD   Signed by:   Marga Melnick MD on 03/20/2010   Method used:   Faxed to ...       [facility]* (retail)       [address]       [address] Shipping/mailing       [place], [place]  [address]       Ph: [phone]       Fax: [phone]   RxID:   [id number]

## 2010-10-15 NOTE — Progress Notes (Signed)
Summary: RX  Phone Note Refill Request Call back at [phone] Message from:  Patient  Refills Requested: Medication #1:  JANUVIA 100 MG TABS 1/2 by mouth once daily **LABS DUE**   Dosage confirmed as above?Dosage Confirmed   Supply Requested: 1 month INSTEAD OF 100 MG NEED 50 MG TWICE A DAY--[facility]  Initial call taken by: Freddy Jaksch,  October 05, 2010 12:55 PM    New/Updated Medications: JANUVIA 50 MG TABS (SITAGLIPTIN PHOSPHATE) 1/2 by mouth two times a day Prescriptions: JANUVIA 50 MG TABS (SITAGLIPTIN PHOSPHATE) 1/2 by mouth two times a day  #15 x 0   Entered by:   Shonna Chock CMA   Authorized by:   Marga Melnick MD   Signed by:   Shonna Chock CMA on 10/05/2010   Method used:   Electronically to        [facility]* (retail)       [address]       [address] Shipping/mailing       [place], [place]  [address]       Ph: [phone]       Fax: [phone]   RxID:   [id number]   Appended Document:  Rx  Pt called back and says that the rx was sent over wrong. Pt originally was on 100mg  1/2 tablet 2 times a day. She requested 50 mg tablet 2 times a day. The rx that was sent over was 50 mg 1/2 tablet 2 times a day which is not correct. Please resend the correct rx 50mg  tablet 2 times a day to [facility] and notify patient once it is done.Marland KitchenFreddy Jaksch  October 06, 2010 12:54 PM        New/Updated Medications: JANUVIA 50 MG TABS (SITAGLIPTIN PHOSPHATE) 1 by mouth two times a day **LABS DUE** Prescriptions: JANUVIA 50 MG TABS (SITAGLIPTIN PHOSPHATE) 1 by mouth two times a day **LABS DUE**  #60 x 0   Entered by:   Shonna Chock CMA   Authorized by:   Marga Melnick MD   Signed by:   Shonna Chock CMA on 10/06/2010   Method used:   Electronically to        [facility]* (retail)       [address]       [address] [place] Shipping/mailing       [place], [place]  [address]       Ph: [phone]       Fax:  [phone]   RxID:   [id number]

## 2010-11-20 ENCOUNTER — Encounter (INDEPENDENT_AMBULATORY_CARE_PROVIDER_SITE_OTHER): Payer: Self-pay | Admitting: *Deleted

## 2010-11-20 ENCOUNTER — Other Ambulatory Visit: Payer: Self-pay | Admitting: Internal Medicine

## 2010-11-20 ENCOUNTER — Other Ambulatory Visit (INDEPENDENT_AMBULATORY_CARE_PROVIDER_SITE_OTHER): Payer: PRIVATE HEALTH INSURANCE

## 2010-11-20 DIAGNOSIS — T887XXA Unspecified adverse effect of drug or medicament, initial encounter: Secondary | ICD-10-CM

## 2010-11-20 DIAGNOSIS — E1165 Type 2 diabetes mellitus with hyperglycemia: Secondary | ICD-10-CM

## 2010-11-20 DIAGNOSIS — D649 Anemia, unspecified: Secondary | ICD-10-CM

## 2010-11-20 DIAGNOSIS — IMO0001 Reserved for inherently not codable concepts without codable children: Secondary | ICD-10-CM

## 2010-11-20 LAB — CBC WITH DIFFERENTIAL/PLATELET
Eosinophils Relative: 3.3 % (ref 0.0–5.0)
HCT: 39.3 % (ref 36.0–46.0)
Lymphocytes Relative: 33.9 % (ref 12.0–46.0)
Lymphs Abs: 3.3 10*3/uL (ref 0.7–4.0)
Monocytes Relative: 7.3 % (ref 3.0–12.0)
Neutrophils Relative %: 55 % (ref 43.0–77.0)
Platelets: 347 10*3/uL (ref 150.0–400.0)
WBC: 9.8 10*3/uL (ref 4.5–10.5)

## 2010-11-20 LAB — IBC PANEL
Iron: 60 ug/dL (ref 42–145)
Saturation Ratios: 20.4 % (ref 20.0–50.0)
Transferrin: 209.9 mg/dL — ABNORMAL LOW (ref 212.0–360.0)

## 2010-11-20 LAB — B12 AND FOLATE PANEL
Folate: 8.2 ng/mL (ref >5.9)
Vitamin B-12: 370 pg/mL (ref 211–911)

## 2010-11-23 ENCOUNTER — Encounter: Payer: Self-pay | Admitting: Internal Medicine

## 2010-11-23 ENCOUNTER — Other Ambulatory Visit: Payer: Self-pay | Admitting: Internal Medicine

## 2010-11-23 ENCOUNTER — Ambulatory Visit (INDEPENDENT_AMBULATORY_CARE_PROVIDER_SITE_OTHER): Payer: PRIVATE HEALTH INSURANCE | Admitting: Internal Medicine

## 2010-11-23 DIAGNOSIS — R002 Palpitations: Secondary | ICD-10-CM

## 2010-11-23 DIAGNOSIS — IMO0001 Reserved for inherently not codable concepts without codable children: Secondary | ICD-10-CM

## 2010-11-23 DIAGNOSIS — I10 Essential (primary) hypertension: Secondary | ICD-10-CM

## 2010-11-23 LAB — MAGNESIUM: Magnesium: 1.8 mg/dL (ref 1.5–2.5)

## 2010-12-01 ENCOUNTER — Telehealth: Payer: Self-pay | Admitting: *Deleted

## 2010-12-01 NOTE — Telephone Encounter (Signed)
Spoke w/ pt given recommendations on how to increase potassium intake to see if this will help w/ leg cramps informed that if no improvement will call for appt pt ok'd information.

## 2010-12-01 NOTE — Assessment & Plan Note (Signed)
Summary: F/U on Labs/CM   Vital Signs:  Patient profile:   50 year old female Weight:      297.8 pounds BMI:     51.71 Pulse rate:   94 / minute Resp:     16 per minute BP sitting:   134 / 82  (left arm) Cuff size:   large  Vitals Entered By: Shonna Chock CMA (November 23, 2010 9:31 AM) CC: Follow-up visit: discuss labs (copy given), Type 2 diabetes mellitus follow-up, Palpitations   CC:  Follow-up visit: discuss labs (copy given), Type 2 diabetes mellitus follow-up, and Palpitations.  History of Present Illness:    Labs reviewed & risks  discussed; A1c is up from 7.7% to 9.1% ( risk up from 54 to 82 %). She   reports polyuria, polydipsia, blurred vision, and weight gain of 15#, but denies numbness of extremities.  The patient denies the following symptoms: neuropathic pain, chest pain, vomiting, orthostatic symptoms, poor wound healing, intermittent claudication, vision loss, and foot ulcer.  Since the last visit the patient reports fair  dietary compliance, compliance with medications, not exercising regularly, and monitoring blood glucose.  The patient has been measuring capillary blood glucose before breakfast (280 average), 2 hrs after breakfast  and after lunch , < 200.  Since the last visit, the patient reports having had no eye care.  Metformin  @ dose > 500 mg two times a day caused "rectal tears". Januvia 100 mg caused migraines ; 50 mg two times a day was tolerated.      The patient also presents with Palpitations for 1 month.  The patient denies dizziness, presyncope, shortness of breath, and throat tightness.  The palpitations are described as a sensation of the heart skipping beats and rapid heart fluttering.  The palpitations are sudden in onset, intermittent, and occur daily, mainly @ night.  The palpitations are  not worse with exercise; there are no definite triggers.    Current Medications (verified): 1)  Cartia Xt 240 Mg Cp24 (Diltiazem Hcl Coated Beads) .Marland Kitchen.. 1 Capsule  Daily 2)  Januvia 50 Mg Tabs (Sitagliptin Phosphate) .Marland Kitchen.. 1 By Mouth Two Times A Day **labs Due** 3)  Glimepiride 4 Mg  Tabs (Glimepiride) .... 1/2 Once Daily 4)  All Day Allergy 10 Mg Tabs (Cetirizine Hcl) .Marland Kitchen.. 1 By Mouth Once Daily 5)  Lisinopril 20 Mg  Tabs (Lisinopril) .Marland Kitchen.. 1qd 6)  Bayer Aspirin 325 Mg Tabs (Aspirin) .Marland Kitchen.. 1 By Mouth Once Daily 7)  Ibuprofen 200 Mg Caps (Ibuprofen) .Marland Kitchen.. 1 By Mouth As Needed  Allergies: 1)  ! Zithromax 2)  ! Metformin Hcl (Metformin Hcl) 3)  * Hctz  Physical Exam  General:  in no acute distress; alert,appropriate and cooperative throughout examination Neck:  No deformities, masses, or tenderness noted. Lungs:  Normal respiratory effort, chest expands symmetrically. Lungs are clear to auscultation, no crackles or wheezes. Heart:  Normal rate and regular rhythm. S1 and S2 normal without gallop, murmur, click, rub .S4 Pulses:  R and L carotid,radial,dorsalis pedis and posterior tibial pulses are full and equal bilaterally Extremities:  No clubbing, cyanosis, edema.Good nail health Neurologic:  alert & oriented X3, sensation intact to light touch over feet , and DTRs symmetrical  but 0-1/2+ @ knees Skin:  Intact without suspicious lesions or rashes Psych:  memory intact for recent and remote, normally interactive, good eye contact, and not anxious appearing.     Impression & Recommendations:  Problem # 1:  DIABETES MELLITUS, TYPE II, UNCONTROLLED (  ICD-250.02)  The following medications were removed from the medication list:    Januvia 50 Mg Tabs (Sitagliptin phosphate) .Marland Kitchen... 1 by mouth two times a day **labs due** Her updated medication list for this problem includes:    Glimepiride 4 Mg Tabs (Glimepiride) .Marland Kitchen... 1/2 once daily    Lisinopril 20 Mg Tabs (Lisinopril) .Marland Kitchen... 1qd    Bayer Aspirin 325 Mg Tabs (Aspirin) .Marland Kitchen... 1 by mouth once daily    Janumet 50-500 Mg Tabs (Sitagliptin-metformin hcl) .Marland Kitchen... 1 two times a day with 2 largest  meals  Orders: Nutrition Referral (Nutrition)  Problem # 2:  PALPITATIONS (ICD-785.1)  Orders: EKG w/ Interpretation (93000) Venipuncture (32440) TLB-TSH (Thyroid Stimulating Hormone) (84443-TSH) TLB-Potassium (K+) (84132-K) TLB-Calcium (82310-CA) TLB-Magnesium (Mg) (83735-MG) Specimen Handling (10272)  Problem # 3:  HYPERTENSION (ICD-401.9) controlled Her updated medication list for this problem includes:    Cartia Xt 240 Mg Cp24 (Diltiazem hcl coated beads) .Marland Kitchen... 1 capsule daily    Lisinopril 20 Mg Tabs (Lisinopril) .Marland Kitchen... 1qd  Complete Medication List: 1)  Cartia Xt 240 Mg Cp24 (Diltiazem hcl coated beads) .Marland Kitchen.. 1 capsule daily 2)  Glimepiride 4 Mg Tabs (Glimepiride) .... 1/2 once daily 3)  All Day Allergy 10 Mg Tabs (Cetirizine hcl) .Marland Kitchen.. 1 by mouth once daily 4)  Lisinopril 20 Mg Tabs (Lisinopril) .Marland Kitchen.. 1qd 5)  Bayer Aspirin 325 Mg Tabs (Aspirin) .Marland Kitchen.. 1 by mouth once daily 6)  Ibuprofen 200 Mg Caps (Ibuprofen) .Marland Kitchen.. 1 by mouth as needed 7)  Janumet 50-500 Mg Tabs (Sitagliptin-metformin hcl) .Marland Kitchen.. 1 two times a day with 2 largest meals  Patient Instructions: 1)  Avoid High Fructose Corn Syrup sugar as discussed. 2)  Please schedule a follow-up appointment in 2 months. 3)  HbgA1C prior to visit, ICD-9:250.02 4)  Urine Microalbumin prior to visit, ICD-9:250.02 Prescriptions: JANUMET 50-500 MG TABS (SITAGLIPTIN-METFORMIN HCL) 1 two times a day WITH 2 largest meals  #60 x 5   Entered and Authorized by:   Marga Melnick MD   Signed by:   Marga Melnick MD on 11/23/2010   Method used:   Print then Give to Patient   RxID:   [id number]    Orders Added: 1)  Est. Patient Level IV [38756] 2)  EKG w/ Interpretation [93000] 3)  Venipuncture [36415] 4)  TLB-TSH (Thyroid Stimulating Hormone) [84443-TSH] 5)  TLB-Potassium (K+) [84132-K] 6)  TLB-Calcium [82310-CA] 7)  TLB-Magnesium (Mg) [83735-MG] 8)  Nutrition Referral [Nutrition] 9)  Specimen Handling [99000]

## 2010-12-02 ENCOUNTER — Ambulatory Visit (INDEPENDENT_AMBULATORY_CARE_PROVIDER_SITE_OTHER): Payer: PRIVATE HEALTH INSURANCE | Admitting: Family Medicine

## 2010-12-02 ENCOUNTER — Encounter: Payer: Self-pay | Admitting: Family Medicine

## 2010-12-02 ENCOUNTER — Encounter: Payer: Self-pay | Admitting: Internal Medicine

## 2010-12-02 ENCOUNTER — Telehealth: Payer: Self-pay | Admitting: *Deleted

## 2010-12-02 VITALS — BP 140/88 | Temp 98.7°F | Ht 64.25 in | Wt 298.5 lb

## 2010-12-02 DIAGNOSIS — R252 Cramp and spasm: Secondary | ICD-10-CM | POA: Insufficient documentation

## 2010-12-02 LAB — BASIC METABOLIC PANEL
CO2: 24 mEq/L (ref 19–32)
Chloride: 104 mEq/L (ref 96–112)
Sodium: 135 mEq/L (ref 135–145)

## 2010-12-02 NOTE — Progress Notes (Signed)
  Subjective:    Patient ID: Sheila Ferguson, female    DOB: 07-05-1961, 50 y.o.   MRN: [id number]  HPI Leg cramps- pt reports sxs are better today than yesterday.  sxs first started yesterday.  Only thing new or different is Jaumet (started 3/12).  sxs are bilateral, R>L.  Woke up yesterday w/ 'charley horse cramping' from hips to toes on both legs.  Drinks 3 cups of water daily.  Ate yogurt yesterday but usual diet has limited K+.  Has had diarrhea since starting meds, vomited x1 yesterday.  Reports legs are 'sore to touch'.  Hx of elevated CK- not on a statin.   Review of Systems For ROS see HPI     Objective:   Physical Exam  Constitutional: She appears well-developed and well-nourished. No distress.  Cardiovascular: Intact distal pulses.   Musculoskeletal:       Non-pitting edema bilaterally + TTP over muscles of LEs bilaterally No TTP over joints No erythema or warmth          Assessment & Plan:

## 2010-12-02 NOTE — Telephone Encounter (Signed)
Pt was notified via phone of lab results.

## 2010-12-02 NOTE — Assessment & Plan Note (Signed)
Pt's sxs are most likely due to combo of poor water intake, low K+ diet, and recent diarrhea caused by Metformin.  Check BMP.  Also check CK given hx of elevated level.  Reviewed importance of increased fluid intake, adequate K+.  Reviewed supportive care and red flags that should prompt return.  Pt expressed understanding and is in agreement w/ plan.

## 2010-12-02 NOTE — Patient Instructions (Signed)
Schedule a follow up with Dr Alwyn Ren in 1-2 weeks to recheck your leg pain and blood pressure Increase your water intake- goal is 6-8 glasses/day Increase the potassium in your diet- refer to the handout We'll notify you of your lab results Call with any questions or concerns Hang in there!

## 2010-12-14 ENCOUNTER — Encounter: Payer: PRIVATE HEALTH INSURANCE | Attending: Internal Medicine

## 2010-12-14 DIAGNOSIS — Z713 Dietary counseling and surveillance: Secondary | ICD-10-CM | POA: Insufficient documentation

## 2010-12-14 DIAGNOSIS — E119 Type 2 diabetes mellitus without complications: Secondary | ICD-10-CM | POA: Insufficient documentation

## 2011-01-05 ENCOUNTER — Other Ambulatory Visit: Payer: Self-pay | Admitting: Internal Medicine

## 2011-01-05 ENCOUNTER — Other Ambulatory Visit: Payer: Self-pay | Admitting: Family Medicine

## 2011-01-05 NOTE — Telephone Encounter (Signed)
Per last ov the pt was to follow up with [facility]. Refill sent.

## 2011-01-15 ENCOUNTER — Other Ambulatory Visit (INDEPENDENT_AMBULATORY_CARE_PROVIDER_SITE_OTHER): Payer: PRIVATE HEALTH INSURANCE

## 2011-01-15 DIAGNOSIS — IMO0001 Reserved for inherently not codable concepts without codable children: Secondary | ICD-10-CM

## 2011-01-15 LAB — HEMOGLOBIN A1C: Hgb A1c MFr Bld: 9.1 % — ABNORMAL HIGH (ref 4.6–6.5)

## 2011-01-15 LAB — MICROALBUMIN / CREATININE URINE RATIO
Microalb Creat Ratio: 2.1 mg/g (ref 0.0–30.0)
Microalb, Ur: 2.9 mg/dL — ABNORMAL HIGH (ref 0.0–1.9)

## 2011-01-22 ENCOUNTER — Encounter: Payer: Self-pay | Admitting: Internal Medicine

## 2011-01-22 ENCOUNTER — Ambulatory Visit (INDEPENDENT_AMBULATORY_CARE_PROVIDER_SITE_OTHER): Payer: PRIVATE HEALTH INSURANCE | Admitting: Internal Medicine

## 2011-01-22 DIAGNOSIS — IMO0001 Reserved for inherently not codable concepts without codable children: Secondary | ICD-10-CM

## 2011-01-22 MED ORDER — LIRAGLUTIDE 18 MG/3ML ~~LOC~~ SOLN: 1.2000 mg | Freq: Every day | SUBCUTANEOUS | Status: DC

## 2011-01-22 NOTE — Progress Notes (Signed)
  Subjective:    Patient ID: Sheila Ferguson, female    DOB: [birth year], 50 y.o.   MRN: [id number]  HPI Diabetes Monitor    Fasting blood sugar range:128-300 Fasting blood sugar average:216 Highest glucose 2 hours postprandially: 285 Hypoglycemia:no Polyuria/polydipsia/polyphagia:increased thirst & hunger Postural symptoms (lightheadedness):no Chest pain/palpitations/claudication:"little palpitations" Skin lesions/ulcers:no Numbness, tingling, burning extremities:no Weight change:up 5# Vision change ( blurring, diplopia, vision loss):no Exercise:no Nutrition/diet:decreased fried foods Medication compliance:yes Adverse medicine effects:no Eye exam:< 12 months Footcare:12 months ago A1 C./urine microalbumin: The A1c has not changed in the last 2 months; it is 9.1%. This would correspond to an average sugar of 215 and long-term cardiovascular risk of > 80%. Approximately a year ago her A1c varied  from 6.4-7.7. Three  years ago her A1c was 5.8. The periods of  good control corresponded to her taking optimal doses of metformin, but she's had rectal  tears when she takes full doses of  Metformin. She attended Nutrition Classes for a total of 9 hrs last month.       Review of Systems     Objective:   Physical Exam Gen.: Morbidly obese. Alert, appropriate and cooperative throughout exam. Neck: No deformities, masses, or tenderness noted.  Thyroid normal. Lungs: Normal respiratory effort; chest expands symmetrically. Lungs are clear to auscultation without rales, wheezes, or increased work of breathing. Heart: Normal rate and rhythm. Normal S1 and S2. No gallop, click, or rub.  No murmur. Abdomen: Bowel sounds normal; abdomen soft and nontender. No masses, organomegaly or hernias noted. Protuberant . No clubbing, cyanosis, edema, or deformity noted. Joints normal. Nail health  good. Vascular: Carotid, radial artery, dorsalis pedis and dorsalis posterior tibial pulses are full and  equal. No bruits present. Neurologic: Alert and oriented x3. Deep tendon reflexes symmetrical and  0-1/2+.Skin  without suspicious lesions or rashes. Lymph: No cervical, axillary, or inguinal lymphadenopathy present. Psych: Mood and affect are normal. Normally interactive                                                                                         Assessment & Plan:  #1 uncontrolled diabetes with dramatically increased risk of cardiovascular event. This was discussed frankly with her  #2 morbid obesity  #3 rectal tears with full dose metformin  Plan: #1 discontinue   glimiperide  #2 Victoza 0.6 mg subcutaneous daily for one week and then 1.2 mg subcutaneous thereafter  #3 repeat A1c, BUN, creatinine, hepatic panel and fasting lipids in 10 weeks.  #4 Consider bariatric surgery if there is  suboptimal response to these interventions.

## 2011-01-22 NOTE — Patient Instructions (Signed)
Start Victoza injections  0.6 mg subcutaneously daily for one week. Thereafter increase it to 1.2 mg subcutaneous daily. Follow low-carb diet as discussed such as theNew Sugar Busters. Check fasting labs in 10 weeks: A1c, BUN, creatinine, hepatic panel, lipids. (250.02, 995.20)

## 2011-01-26 NOTE — Assessment & Plan Note (Signed)
[facility]                         GASTROENTEROLOGY OFFICE NOTE   NAME:Sheila Ferguson, Sheila Ferguson                  MRN:          [id number]  DATE:03/22/2007                            DOB:          1960-12-13    [name] returns for followup of dysphagia.  She was hospitalized for  an episode of chest pain that radiated down her left arm.  She underwent  cardiac catheterization which was unremarkable.  During the admission it  was determined that she had been having dysphagia, and her symptoms of  chest pain developed shortly after swallowing a pill.  She relates a  history of dysphagia and a prior esophageal dilation by Dr. Cristy Folks 7-  8 years ago.  She was placed on pantoprazole in the hospital and remains  on that since returning home.  She has had no further episodes of chest  pain and notes her reflux symptoms have improved.  She still is having  some belching and regurgitation.  Her dysphagia to pills has persisted.  She occasionally notes dysphagia to liquids as well.  She is having no  more problems with her anal fissure.  She relates that she is having  significant abdominal pain and diarrhea related to Metformin, so she has  not been taking the medication.  She states when she restarts it, she  may be able to take a pill or 2 and then the diarrhea and abdominal pain  return.   CURRENT MEDICATIONS:  Listed on the chart, updated and reviewed.   MEDICATION ALLERGIES:  CODEINE and CEPHALOSPORINS.   PHYSICAL EXAMINATION:  Overweight, no acute distress.  Weight 264.4 pounds.  Blood pressure is 110/70, pulse 92 and regular.  HEENT EXAM:  Anicteric sclerae.  Oropharynx clear.  CHEST:  Clear to auscultation bilaterally without chest wall tenderness.  CARDIAC:  Regular rate and rhythm without murmurs appreciated.  ABDOMEN:  Soft and nontender with normoactive bowel sounds.   ASSESSMENT AND PLAN:  1. Gastroesophageal reflux disease with dysphagia  to pills and      occasionally liquids.  Rule out esophagitis, strictures, and, less      likely, neoplasms.  Continue pantoprazole 40 mg p.o. q.a.m. along      with standard antireflux measures.  Risks, benefits, and      alternatives to upper endoscopy, to possible biopsy and possible      dilation discussed with the patient.  She consents to proceed.      This will be scheduled electively.  2. Diarrhea and abdominal pain secondary to metformin.  I advised her      to contact Dr. Alwyn Ren for an alternative medication to manage her      diabetes.  3. Resolved anal fissure.     Venita Lick. Russella Dar, MD, [facility]  Electronically Signed   MTS/MedQ  DD: 03/24/2007  DT: 03/24/2007  Job #: [id number]   cc:   Titus Dubin. Alwyn Ren, MD,FACP,FCCP

## 2011-01-26 NOTE — Cardiovascular Report (Signed)
NAME:  SELLERSAlexanderia, Gorby           ACCOUNT NO.:  [id number]   MEDICAL RECORD NO.:  [id number]          PATIENT TYPE:  INP   LOCATION:  5532                         FACILITY:  [facility]   PHYSICIAN:  Everardo Beals. Juanda Chance, MD, FACCDATE OF BIRTH:  1961/07/25   DATE OF PROCEDURE:  02/22/2007  DATE OF DISCHARGE:  02/22/2007                            CARDIAC CATHETERIZATION   HISTORY:  Ms. Bruster is 50 years old and has had no prior history of  heart disease.  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  Last night, while she was at  work, she developed chest pain with some radiation to her left arm, and  she was seen there, I believe by our cardiology fellow, and admitted,  and was seen by Dr. Gala Romney earlier today.  She was scheduled for  evaluation with coronary angiography.   PROCEDURE:  The procedure was performed via the femoral artery using an  arterial sheath and 6-French preformed coronary catheters.  A front wall  arterial puncture was performed, and Omnipaque contrast was used.  The  right femoral artery was closed with Angio-Seal at the end of the  procedure.  The patient tolerated the procedure well and left the  laboratory in satisfactory condition.   RESULTS:  The left main coronary artery was free of significant disease.   The left anterior descending artery gave rise to a diagonal branch and  two septal perforators.  The LAD was quite tortuous, but the vessel was  smooth and free of significant disease.   The circumflex arteries gave rise to a ramus branch and a large  posterolateral branch.  This vessel was also quite tortuous, but was  free of significant disease.   The right coronary artery was a moderate-sized vessel and gave rise to a  large right ventricular branch, a posterior descending branch, and  posterolateral branch.  This vessel was also quite tortuous, but was  free of disease.   The left ventriculogram  performed in the RAO projection showed good wall  motion with no areas of hypokinesis.  The estimated ejection fraction  was 60%.   CONCLUSION:  Normal coronary angiography and left ventricular wall  motion.   RECOMMENDATIONS:  Reassurance.  In view of these findings and in further  discussing the patient's symptoms, she is having significant dysphagia  and has a history of previous esophageal dilatation by Dr. Tad Moore.  She  also has  some symptoms of reflux.  I suspect her symptoms are related to reflux  and esophageal stricture.  Will plan to discharge for later today and  treat her with a proton pump inhibitor.  Will arrange a follow-up GI  consultation as an outpatient.      Bruce Elvera Lennox Juanda Chance, MD, [facility]  Electronically Signed     BRB/MEDQ  D:  02/22/2007  T:  02/23/2007  Job:  [id number]   cc:   Titus Dubin. Alwyn Ren, MD,FACP,FCCP  Bevelyn Buckles. Bensimhon, MD  Cardipulmonary Laboratory

## 2011-01-26 NOTE — H&P (Signed)
NAME:  Sheila Ferguson, Sheila Ferguson NO.:  [id number]   MEDICAL RECORD NO.:  [id number]          PATIENT TYPE:  EMS   LOCATION:  ED                           FACILITY[facility]  [facility]   PHYSICIAN:  Hollice Espy, M.D.DATE OF BIRTH:  07-Mar-1961   DATE OF ADMISSION:  02/21/2007  DATE OF DISCHARGE:                              HISTORY & PHYSICAL   ATTENDING PHYSICIAN:  Valerie A. Felicity Coyer, M.D.   PRIMARY CARE PHYSICIAN:  Titus Dubin. Alwyn Ren, MD,FACP,FCCP of [facility].   CONSULTATIONS:  [facility].   CHIEF COMPLAINT:  Chest pain.   HISTORY OF PRESENT ILLNESS:  The patient is a 50 year old African-  American female with past medical history of well controlled  hypertension, plus morbid obesity, poorly controlled diabetes mellitus,  and hyperlipidemia, who has been relatively well and then starting this  evening at work without exertion, she started having sudden onset chest  tightness right above her left breast radiating up to the left side of  her neck and over to her left shoulder and going down her left arm  causing some left arm numbness.  The whole episode lasted for about 15  minutes and then resolved on its own, but her left arm continued to  remain numb for some time.  She had no associated shortness of breath  and while she sat down when this happened, she stated that she did not  feel lightheaded.  She had no nausea or vomiting.  She has never had any  previous episodes like this.  When she came to the emergency room, she  had labs checked.  Cardiac enzymes were delayed because the patient was  a difficult stick but appeared negative.  EKG showed a normal sinus  rhythm.  Currently, the patient is doing well, she denies any headaches,  vision changes, dysphagia, chest pain, palpitations, shortness of  breath, wheezing, coughing.  No abdominal pain, no hematuria, dysuria,  constipation, diarrhea, focal extremity numbness, weakness, or pain.  However, review of systems are otherwise negative.   PAST MEDICAL HISTORY:  Includes diabetes, obesity, hypertension, and  hyperlipidemia.  Also sleep apnea.   MEDICATIONS:  1. Cardia XT 240 mg p.o. daily.  2. Zestoretic 20/12.5 p.o. daily.  3. Metformin 500 mg p.o. b.i.d. which she says she does not take      consistently.  4. Desipramine 25 mg p.o. daily.  5. Topamax 100 mg p.o. q.h.s.  6. CPAP with a setting of 10 nightly.   ALLERGIES:  CEPHALOSPORINS.   SOCIAL HISTORY:  She denies any tobacco, alcohol, or drug use.   FAMILY HISTORY:  Noted for multiple family members with MI's, some at  young age in their 73s and 63s.   PHYSICAL EXAMINATION:  VITAL SIGNS:  On admission temperature 97.8,  heart rate 94, blood pressure 139/69, respirations 20, O2 saturation  100% on room air.  GENERAL:  The patient is alert and oriented x3 in no acute distress.  HEENT:  Normocephalic and atraumatic.  Mucous membranes are moist.  She  has no carotid bruits.  HEART:  Very soft.  Regular rate and rhythm S1  and S2.  LUNGS:  Decreased breath sounds throughout secondary to body habitus.  ABDOMEN:  Soft, obese, and nontender.  Positive bowel sounds.  EXTREMITIES:  No cyanosis, clubbing, or edema.   LABORATORY DATA:  Sodium 137, potassium 3, chloride 103, bicarb 25, BUN  9, creatinine 0.9, glucose 132.  Her LFT's are notable for a white count  of 10.9, H&H 13.1 and 38, MCV 87, platelet count 457 and no shift.  First set of cardiac markers; CPK 72.2, MB 1.4, troponin I less than  0.05.  D-dimer is normal at 0.29.  Coags are unremarkable.  Second set  of cardiac markers is similarly negative.   ASSESSMENT:  1. Chest pain.  The patient gives a very convincing story that in the      setting of her obesity, poorly controlled diabetes, and strong      family history, she certainly needs to come in for further      evaluation.  We will check two more sets of cardiac enzymes and      tomorrow we will  discuss with [facility] about getting an      inpatient stress test.  The patient is able to walk on a treadmill.  2. Diabetes mellitus.  Continue Metformin plus sliding scale.  3. Hypertension.  We will hold her Cardizem secondary to rate control.  4. Sleep apnea, continue CPAP.      Hollice Espy, M.D.  Electronically Signed     SKK/MEDQ  D:  02/21/2007  T:  02/21/2007  Job:  [id number]   cc:   Vikki Ports A. Felicity Coyer, MD  [address] [place], [place] [address]   Titus Dubin. Alwyn Ren, MD,FACP,FCCP  [phone] [place]  [place] [address]   [facility]

## 2011-01-26 NOTE — H&P (Signed)
NAME:  Sheila Ferguson           ACCOUNT NO.:  [id number]   MEDICAL RECORD NO.:  [id number]          PATIENT TYPE:  INP   LOCATION:  5532                         FACILITY:  [facility]   PHYSICIAN:  Bevelyn Buckles. Bensimhon, MDDATE OF BIRTH:  [birth year]   DATE OF ADMISSION:  02/22/2007  DATE OF DISCHARGE:                              HISTORY & PHYSICAL   CARDIOLOGIST:  New, being seen by Dr. Gala Romney.   PRIMARY CARE:  Dr. Marga Melnick.   GI:  Dr. Claudette Head.  Sheila Ferguson is a very pleasant 50 year old African American female with  no known coronary artery disease history who presents to [facility]  however complaining of chest discomfort.  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  Sheila Ferguson was at work on the day of admission and at about 4:45  in the evening states Sheila Ferguson had not really felt good all day, just could  not really put her finger on it though.  Just prior to packing up for  the day Sheila Ferguson was sitting at her desk, had a sudden onset of tightness and  heaviness and pressure across the front of her chest.  Sheila Ferguson states this  ran down the inside of her left arm and felt like a numb ache. The  heaviness in her chest continued.  Sheila Ferguson became concerned.  Sheila Ferguson had a  coworker check her blood pressure.  Blood pressure was 115/80.  Sheila Ferguson then  spoke with the one of the physician assistants there at [facility].  Sheila Ferguson was still having chest pain that lasted approximately 15 minutes and  resolved, but the left arm continued to ache.  Sheila Ferguson states it just felt  funny.  Sheila Ferguson went into the emergency room there at [facility] for  further evaluation.  Sheila Ferguson denied any associated discomfort with it  including nausea, vomiting, dizziness or diaphoresis.  Discomfort did  not change with activity or position or breathing.  In the emergency  room Sheila Ferguson received Zofran, morphine, nitroglycerin, aspirin and potassium  as her potassium  was 3.0.   Sheila Ferguson states the left arm discomfort was  relieved with the above medications and then Sheila Ferguson had a resulting  headache of course.  Sheila Ferguson was transferred to [facility] for further  evaluation.  We were asked by Dr. Rito Ehrlich to evaluate the patient for  possible stress test, however in reviewing her risk factors, I decided  to proceed with a formal consultation.  Sheila Ferguson currently is pain free. Ms.  Figg also has noted some mild dysphasia over the last 2 days.  Sheila Ferguson  does have a history of esophageal strictures and underwent dilatation  twice but has been approximately 16 to 18 years ago.  Also is a  diabetic, states Sheila Ferguson is not very compliant with her metformin or diet,  and is not very compliant with checking her CBGs at home either.   ALLERGIES:  CEPHALOSPORINS  and CODEINE makes her very confused.   MEDICATIONS AT HOME:  1. Cardia 240.  2. Lisinopril 20/12.5  3. Metformin Sheila Ferguson should  be taking  500 b.i.d. but Sheila Ferguson takes this      p.r.n.  4. Topamax of 100 and desipramine.  5. Sheila Ferguson also uses C-PAP at home.   At [facility] Sheila Ferguson is getting C-PAP  q.h.s., Topamax, metformin HCTZ  12.5, lisinopril 20 and Lovenox.   PAST MEDICAL HISTORY:  Includes morbid obesity, hypertension for years,  poorly controlled diabetes.  The patient states that Sheila Ferguson has had it for  years, hyperlipidemia, obstructive sleep apnea with C-PAP use.  The  patient states Sheila Ferguson is not always compliant with the C-PAP, history of  migraine, history of esophageal stricture status post dilatation twice,  inactive sarcoid,  status post hysterectomy, status post kidney stones,  status post cholecystectomy, history of irritable bowel syndrome,  history of anal fissure.   SOCIAL HISTORY:  Sheila Ferguson lives in [place] alone in an apartment.  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  Sheila Ferguson tries to follow a ADA  diet but is not very compliant.   FAMILY HISTORY:   Mother deceased age 74.  Sheila Ferguson had a history of diabetes  and hypertension, however deceased secondary to brain injury.  Father  deceased in his 6s with a history of sarcoidosis and leukemia.  Siblings alive with diabetes, hypertension and asthma.   REVIEW OF SYSTEMS:  Positive for chest pain, dyspnea on exertion,  nocturia, numbness in feet at all times, arthralgias in hips and knees,  nausea, occasional diarrhea and dysphasia.  All other systems negative  per patient.   PHYSICAL EXAM:  Temperature 97.6, pulse 70, respirations 18, blood  pressure 104/61,  pulse oximetry 100% on room air.  Weight 118 kg which  is equivalent to about approximately 260 pounds, no acute distress.  Sheila Ferguson  is mildly groggy this morning.  HEENT:  Normocephalic, atraumatic.  Pupils equal, round, reactive to  light.  Sclerae is clear.  Nares without discharge, mucous membrane is  moist.  Sheila Ferguson has her own teeth.  NECK:  Supple without lymphadenopathy,  no bruits.  No JVD.  CARDIOVASCULAR:  Exam reveals a heart with regular rhythm.  S1-S2.  LUNGS: Clear to auscultation bilaterally.  SKIN is warm and dry.  ABDOMEN:  Soft, nontender, obese, positive bowel sounds.  LOWER EXTREMITIES: Without clubbing, cyanosis.  Sheila Ferguson has a trace of  nonpitting edema in her left lower extremity.  NEUROLOGICALLY:  Alert and oriented x3.   DATA:  CHEST X-RAY:  Showing no acute findings.  EKG sinus rhythm  without acute ST or T-wave changes at a rate of 71.  CBC H&H of 13.1,  38.7, WBCs 10.9, platelets 457.  Sodium 137, potassium 3.0, chloride  103, CO2 25, BUN and creatinine 9 and 0.9 with glucose of 132.  Repeat  BMET is pending at this time.  Cardiac enzymes.  Sheila Ferguson has had two sets of  point of cares with her negative and first set of cardiac enzymes are  negative.  PT/INR 12.7 and 0.9,  D-dimer 0.29   IMPRESSION:  1. Chest pain.  2. Diabetes poorly controlled. 3. Hypertension.  4. Morbid obesity.  5. Medical noncompliance with  diet, medications, diabetic management      and exercise.  6. History of esophageal stricture status post dilatation in the past.  7. Obstructive sleep apnea with partial compliance with C-PAP.   Risk and benefits of stress Myoview and cardiac catheterization have  been discussed with the patient.  Dr. Gala Romney in to examine and assess  patient.  The patient states Sheila Ferguson would like to proceed with cardiac  catheterization for a more definitive diagnosis. We will plan on  proceeding with cardiac catheterization today, complete cardiac enzymes,  check a TSH, fasting lipids and hold metformin.      Dorian Pod, ACNP      Bevelyn Buckles. Bensimhon, MD  Electronically Signed    MB/MEDQ  D:  02/22/2007  T:  02/22/2007  Job:  [id number]   cc:   Titus Dubin. Alwyn Ren, MD,FACP,FCCP  Venita Lick. Russella Dar, MD, [facility]

## 2011-01-26 NOTE — Discharge Summary (Signed)
NAME:  [name], [name]           ACCOUNT NO.:  [id number]   MEDICAL RECORD NO.:  [id number]          PATIENT TYPE:  INP   LOCATION:  0101                         [facility]ACILITY:  [facility]   PHYSICIAN:  Everardo Beals. Juanda Chance, MD, FACCDATE OF BIRTH:  03-12-1961   DATE OF ADMISSION:  02/21/2007  DATE OF DISCHARGE:  02/22/2007                         DISCHARGE SUMMARY - REFERRING   DISCHARGE DIAGNOSIS:  1. Chest discomfort of uncertain etiology, probably gastrointestinal      related.  2. Hyperlipidemia with a history of diabetes and poor sugar control,      as evidenced by elevated hemoglobin on A1C.  3. Obesity.  4. Gastroesophageal reflux disease with dysphagia symptoms.  History      as noted below.   PROCEDURES PERFORMED:  Cardiac catheterization on February 22, 2007 by Dr.  Charlies Constable.   SUMMARY OF HISTORY:  Sheila Ferguson is a 50 year old African-American  female who developed sudden onset of chest tightness above her left  breast, radiating to the left side of her neck and over to her left  shoulder and down her arm, associated with left arm numbness.  It lasted  for approximately 15 minutes and then resolved on its own, but her arm  remained numb.  She had no associated shortness of breath,  lightheadedness, nausea or vomiting, or prior episodes.  She presented  to the emergency room for further evaluation.   PAST MEDICAL HISTORY:  Obesity, diabetes, hypertension, hyperlipidemia,  and sleep apnea.   LABORATORY DATA:  Admission H&H was 13.1 and 38.7, normal indices,  platelets 457.  WBCs 10.9.  PT 12.7, D-dimer 0.29.  Sodium 137,  potassium 3, BUN 9, creatinine 0.92, glucose 132.  Prior to discharge,  sodium was 134, potassium 3.3, BUN 9, creatinine 0.85.  Hemoglobin A1C  was elevated at 7.3.  Serial CK-MBs, relative indexes, and troponins  were within normal limits.  Fasting lipids showed a total cholesterol of  147, triglycerides 66, HDL 32, LDL 102.  TSH was within normal limits.   Chest x-ray does not show any acute abnormalities.   EKG showed a normal sinus rhythm, normal axis, nonspecific ST-T wave  changes.   HOSPITAL COURSE:  Patient was admitted by Dr. Diamantina Monks service for  further evaluation.  Overnight, she did not have any further chest  discomfort.  Dr. Juanda Chance saw the patient and performed cardiac  catheterization on the 11th.  Catheterization did not show any evidence  of coronary artery disease.  EF was 60%.  Dr. Juanda Chance felt that her  symptoms may be due to reflux and her dysphagia symptoms.  She does have  a history of esophageal stricture and has been dilated in the past.  It  was felt that she could be discharged home post bedrest and ambulation  with an early GI followup within the next week.  Catheterization size  was then intact post ambulation; thus, she was discharged home late on  the 11th.   DISPOSITION:  Patient is discharged home.  She is asked to maintain a  low sodium, heart-healthy ADA diet.  Wound care and activities are per  supplemental sheet.  She  was asked to call Dr. Ardell Isaacs office in the  morning to arrange an appointment within one week.  In the morning, I  will also contact GI for them to be expecting her call.  She received a  new prescription for Protonix 40 mg daily.  She was asked to continue  her other medications, which include Cartia XT 240 daily, Zesteretic  20/12.5 daily, resume Metformin 500 mg b.i.d. on Friday, February 24, 2007  (apparently patient does not take this on a regular basis), desipramine  25 mg daily, Topamax 100 mg nightly, and CPAP, as previously.  She was  asked to keep a blood pressure and sugar diary, bring all medications  and diary to all appointments.   DISCHARGE TIME:  Twenty-five minutes.      Joellyn Rued, PA-C      Bruce R. Juanda Chance, MD, [facility]  Electronically Signed    EW/MEDQ  D:  02/22/2007  T:  02/23/2007  Job:  [id number]   cc:   Titus Dubin. Alwyn Ren, MD,FACP,FCCP  Venita Lick. Russella Dar,  MD, [facility]

## 2011-01-29 ENCOUNTER — Telehealth: Payer: Self-pay | Admitting: Internal Medicine

## 2011-01-29 MED ORDER — INSULIN PEN NEEDLE 32G X 6 MM MISC: Status: DC

## 2011-01-29 NOTE — Telephone Encounter (Signed)
Patient got 3 month supply of victoza but she needs 3 month supply of needles - [facility] - [address]

## 2011-01-29 NOTE — Assessment & Plan Note (Signed)
[facility]                         GASTROENTEROLOGY OFFICE NOTE   NAME:Bedingfield, JERNIE SCHUTT                  MRN:          [id number]  DATE:11/28/2006                            DOB:          Apr 22, 1961    HISTORY:  Ms. Alcivar returns with improvement in her rectal pain and no  recent episodes of rectal bleeding.  Unfortunately, she has used the  diltiazem cream on a p.r.n. basis only for flares of her symptoms and  she has not been using it regularly due to a misunderstanding.  She  notes that almost any bowel movement exacerbates her symptoms and  particularly loose stools or hard stools.  She has had significant  improvement overall as her pain and bleeding are no longer constant  problems.   MEDICATIONS:  Current medications listed on the chart - updated and  reviewed.   MEDICATION ALLERGIES:  CODEINE INTOLERANCE LEADING TO CONFUSION.   EXAMINATION:  No acute distress.  Weight 263 pounds, blood pressure is  112/70, pulse is 80 and regular.  She is not reexamined.   ASSESSMENT AND PLAN:  Anal fissure.  Improved but not resolved.  Continue Diltiazem 2% cream q.i.d. for 10 weeks.  Return office visit in  3 months.  She is to maintain either a daily fiber supplement or a stool  softener or both and maintain adequate hydration.     Venita Lick. Russella Dar, MD, [facility]  Electronically Signed    MTS/MedQ  DD: 11/28/2006  DT: 11/28/2006  Job #: [id number]   cc:   Titus Dubin. Alwyn Ren, MD,FACP,FCCP

## 2011-01-29 NOTE — Op Note (Signed)
NAME:  Sheila Ferguson, Sheila Ferguson                ACCOUNT NO.:  [id number]   MEDICAL RECORD NO.:  [id number]                   PATIENT TYPE:  OBV   LOCATION:  0105                                 FACILITY:  [facility]   PHYSICIAN:  Valetta Fuller, M.D.               DATE OF BIRTH:  January 06, 1961   DATE OF PROCEDURE:  04/26/2003  DATE OF DISCHARGE:  04/26/2003                                 OPERATIVE REPORT   PREOPERATIVE DIAGNOSIS:  Left proximal ureteral calculus.   POSTOPERATIVE DIAGNOSIS:  Left proximal ureteral calculus.   PROCEDURE:  1. Cystoscopy.  2. Left retrograde pyelogram.  3. Left ureteroscopy with holmium laser lithotripsy.  4. Left double-J ureteral stent placement.   SURGEON:  Valetta Fuller, M.D.   ASSISTANT:  Susanne Borders, MD   ANESTHESIA:  General endotracheal.   DRAINS:  A 6 x 24 cm double-J ureteral stent.   INDICATION FOR PROCEDURE:  Sheila Ferguson is a 50 year old, African-American  female, who was recently evaluated for left flank pain.  She was found to  have an obstructing proximal left ureteral stone.  She has been unable to  pass a stone and is having significant pain and wishes to have the stone  removed.  She has consented to cystoscopy with left ureteroscopic stone  extraction after understanding the risks and the benefits.   DESCRIPTION OF PROCEDURE:  The patient is brought to the operating room and  then correctly identified by her identification bracelet.  She is given  preoperative antibiotics and general endotracheal anesthesia.  She is placed  in dorsal lithotomy position, prepped and draped in typical sterile fashion.  The 22 French cystoscope with 12-degree lens was used to enter the patient's  bladder.  The patient's urethra was within normal limits.  The patient's  bladder demonstrated no obvious mucosal abnormalities.  Bilateral ureteral  orifices were seen with the right effluxing clear urine and no apparent  efflux from the right.  A  whistle-tip catheter was used to cannulate the  left ureter, and approximately 8 mL of contrast was injected.  There was a  filling defect at the proximal left ureter consistent with a known stone.  There was a dilation above this area.  That ureteral catheter was removed,  and a 5 French end-hole ureteral stent was used to cannulate the ureteral  orifice, and Glidewire was passed through the catheter beside the stone.  The ureteral catheter was removed, and the cystoscope was removed.  Next,  the ureter was dilated with a 14 French ureteral dilator using the inside of  an access sheath with a tapered end.  Next, the 6 French rigid ureteroscope  was used to pass beside the wire up the ureter to the level of the stone.  The stone was then lasered with the 365 micron laser fiber into  approximately four fragments.  Laser was removed, and the fragments were  grasped with nitinol basket and removed in their entirety.  The ureteroscope  was passed back up the ureter, and there was much edema at the area of the  stone, but no more stone fragments were visualized.  The ureteroscope was  removed, and the cystoscope was backloaded over the Glidewire.  A 6 x 24  French double-J ureteral stent was passed over the wire under direct vision.  A good curl was seen in the left renal pelvis under fluoroscopy.  The distal  curl was visualized directly with the cystoscope.  The patient's bladder was  then drained, and the patient was allowed to awaken from her anesthesia.  There were no complications during the case.  The patient was taken to the  postanesthesia care unit in stable condition.  Please note that Valetta Fuller, M.D. was the primary surgeon and participated in all aspects of the  case.     Susanne Borders, MD                           Valetta Fuller, M.D.    DR/MEDQ  D:  04/26/2003  T:  04/27/2003  Job:  [id number]

## 2011-01-29 NOTE — Assessment & Plan Note (Signed)
[facility]                         GASTROENTEROLOGY OFFICE NOTE   NAME:Sheila Ferguson, Sheila Ferguson                  MRN:          [id number]  DATE:10/21/2006                            DOB:          [birth year]    REASON FOR REFERRAL:  Rectal bleeding, rectal pain, and variable bowel  habits.   HISTORY OF PRESENT ILLNESS:  Ms. Karg is a 50 year old African-  American female referred through the courtesy of Dr. Marga Melnick.  She relates long-term difficulties with alternating constipation and  diarrhea associated with lower abdominal cramping.  She was evaluated by  Dr. Cristy Folks in the 1990s and underwent upper endoscopy with dilation  and colonoscopy.  Her irritable bowel-type symptoms have been present  for many years and have not substantially changed.  About 2 months ago  she developed the onset of severe anal and rectal pain brought on by  bowel movements.  Her symptoms tend to improve over the next hour or two  after bowel movement.  She has had a loss of appetite and about a 21-  pound weight loss over the past 3 months which she attributes to  starting Topamax.  She has no dysphagia, odynophagia, change in stool  caliber, or melena.  She has noted frequent bright red blood per rectum  with bowel movements.   PAST MEDICAL HISTORY:  1. Hypertension.  2. Diabetes mellitus.  3. Esophageal stricture.  4. Irritable bowel syndrome.  5. Heart murmur.  6. Kidney stones.  7. Sleep apnea.  8. Status post cholecystectomy 1994.  9. Status post hysterectomy 2004.   CURRENT MEDICATIONS:  Listed in the chart, updated and reviewed.   MEDICATION ALLERGIES:  CODEINE leading to confusion.   SOCIAL HISTORY AND REVIEW OF SYSTEMS:  Per the handwritten form.   PHYSICAL EXAMINATION:  GENERAL:  Obese African-American female in no  acute distress.  VITAL SIGNS:  Height 5 feet 5 inches, weight 265.8 pounds, blood  pressure is 120/80, pulse 80 and regular.  HEENT:  Anicteric sclerae, oropharynx clear.  CHEST:  Clear to auscultation bilaterally.  CARDIAC:  Regular rate and rhythm without murmurs appreciated.  ABDOMEN:  Soft, nontender, nondistended, normal active bowel sounds.  No  palpable organomegaly, masses or hernias.  RECTAL:  The patient declined digital examination as she states it would  lead to severe pain.  Inspection of the external anal canal and  perirectal area reveals no abnormalities.  NEUROLOGIC:  Alert and oriented x3.  Grossly nonfocal.   ASSESSMENT AND PLAN:  Rectal bleeding and rectal pain with bowel  movements for 2 months.  Rule out fissures, hemorrhoids, neoplasms,  inflammatory bowel disease, and other disorders.  She is advised to take  Tylenol t.i.d. prn for pain and continue sitz baths.  She does appear to  have longstanding irritable bowel syndrome but we need to exclude IBD.  Will perform a digital rectal examination under sedation at the time of  colonoscopy.  Risks, benefits and alternatives to colonoscopy with  possible biopsy, possible polypectomy, and possible destruction of  internal hemorrhoids discussed with the patient and she consents to  proceed.  This will  be scheduled electively.     Venita Lick. Russella Dar, MD, [facility]  Electronically Signed    MTS/MedQ  DD: 10/21/2006  DT: 10/21/2006  Job #: [id number]   cc:   Titus Dubin. Alwyn Ren, MD,FACP,FCCP

## 2011-01-29 NOTE — Procedures (Signed)
NAME:  Sheila Ferguson, Sheila Ferguson           ACCOUNT NO.:  [id number]   MEDICAL RECORD NO.:  [id number]          PATIENT TYPE:  OUT   LOCATION:  SLEEP CENTER                 FACILITY[facility]  [facility]   PHYSICIAN:  Clinton D. Maple Hudson, MD, FCCP, FACPDATE OF BIRTH:  07-Nov-1960   DATE OF STUDY:  08/15/2006                            NOCTURNAL POLYSOMNOGRAM   REFERRING PHYSICIAN:  Clabe Seal. Meryl Crutch, M.D.   INDICATIONS FOR STUDY:  Hypersomnia with sleep apnea.  Epworth  sleepiness score 13/24.  BMI 46.  Weight 279 pounds.   HOME MEDICATIONS:  Topamax, metformin, lisinopril HCT, Cartia XT.   A diagnostic NPSG protocol was requested.   SLEEP ARCHITECTURE:  Total sleep time 314 minutes with a sleep  efficiency of 85%. Stage I was 3%, stage II 76%, stages III and IV 7%.  REM 14% of total sleep time. Sleep latency 9 minutes. REM latency 95  minutes. Awake after sleep onset 41 minutes. Arousal index 9.6.  No  bedtime medication was taken.   RESPIRATORY DATA:  NPSG protocol.  Apnea/hypopnea index (AHI, RDI): 9.7  obstructive events per hour indicating mild obstructive sleep  apnea/hypopnea syndrome. There were 43 obstructive apneas, and 8  hypopneas. Events were not positional. REM AHI 48.5 per hour.   OXYGEN DATA:  Moderate snoring with oxygen desaturation to a nadir of  82%. Mean oxygen saturation through the study was 96% on room air.   CARDIAC DATA:  Normal sinus rhythm.   MOVEMENT/PARASOMNIA:  No significant limb jerk or unusual behavior  noted.   IMPRESSION/RECOMMENDATIONS:  Mild obstructive sleep apnea/hypopnea  syndrome, AHI 9.7 per hour.  Events were not positional but more  frequent during REM.  REM suppression therapy, such as with  antidepressants, may be of some clinical benefit.  There was moderate  snoring with oxygen desaturation to a nadir of 80%.      Clinton D. Maple Hudson, MD, [facility], FACP  Diplomate, Biomedical engineer of Sleep Medicine  Electronically Signed     CDY/MEDQ  D:  08/21/2006  10:32:31  T:  08/21/2006 17:42:08  Job:  [id number]   cc:   Clabe Seal. Meryl Crutch, M.D.  Fax: [phone]

## 2011-01-29 NOTE — Assessment & Plan Note (Signed)
[facility]                        [place] OFFICE NOTE   NAME:Squitieri, KELVIN BURPEE                  MRN:          [id number]  DATE:10/18/2006                            DOB:          June 26, 1961    Sheila Ferguson was seen October 18, 2006 complaining of alternating  constipation and diarrhea over a 68-month period.  She had not sought  treatment as she thought it would go away.  Additionally, she noted  pain in the rectal area with subsequent production of bleeding the size  of a nickel.  She has had intermittent cramping pain as well.   She has a history of esophageal dilatation twice by Dr. Dortha Kern in  1989 . She may have had positive fecal occult blood in the past as well  because she had a colonoscopy.  She had a cholecystectomy in 1996.  She  has also had renal calculi.  She has had a total abdominal hysterectomy  and right oophorectomy for fibroids.  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   She does not smoke or drink.   [facility] and is on Topamax.  She feels that the weight loss she has experienced has been related to  the Topamax.   There is no family history of GI disease; she states that she was  diagnosed has having irritable bowel by Dr Tad Moore.   She has no foreign travel and drinks bottled water.  She has no exposure  to sick pets.   PHYSICAL EXAMINATION:  Weight was down 13 pounds to 271.  Blood pressure  was 120/74.  ABDOMEN:  Protuberant, but nontender.  SKIN:  Warm and dry.  She had no organomegaly or lymphadenopathy.  She had informed my nurse that she refused rectal exam.   She was placed on Sitz baths and Proctofoam-HC daily 3 times a day.  Levsin 0.125 mg sublingually every 4-6 hours as recommended.  GI  consultation will be pursued through [facility].     Titus Dubin. Alwyn Ren, MD,FACP,FCCP  Electronically Signed    [facility]  DD: 10/19/2006  DT: 10/19/2006  Job #: [id number]

## 2011-01-29 NOTE — Op Note (Signed)
[facility]  Patient:    Sheila Ferguson, Sheila Ferguson Visit Number: [id number] MRN: [id number]          Service Type: GYN Location: 4W [id number] 01 Attending Physician:  Teodora Medici Cabitt Dictated by:   Leatha Gilding. Mezer, M.D. Proc. Date: 11/16/01 Admit Date:  11/16/2001   CC:         Harl Bowie, M.D.  Sharyn Dross., M.D.   Operative Report  PREOPERATIVE DIAGNOSIS:  Fibroid uterus and pelvic pain.  POSTOPERATIVE DIAGNOSES:  Fibroid uterus and pelvic pain plus adhesions.  OPERATION PERFORMED:  Supracervical hysterectomy and right salpingo-oophorectomy.  SURGEON:  Leatha Gilding. Mezer, M.D.  ASSISTANT:  Harl Bowie, M.D.  ANESTHESIA:  General endotracheal.  PREPARATION:  Betadine.  DESCRIPTION OF PROCEDURE:   After the successful induction of general endotracheal anesthesia, a tape sling was then placed to elevated the panniculus to expose the area for a Pfannenstiel incision.  The patient was then prepped and draped in the routine fashion.  A large Pfannenstiel incision was made through the skin and subcutaneous tissue.  The fascia and peritoneum were opened without difficulty.  A brief exploration of her upper abdomen was benign.  Exploration of the pelvis revealed the uterus to be approximately 16 weeks in size with multiple leiomyomata, making exposure of the lateral aspect of the uterus quite challenging.  The round ligaments were isolated, suture ligated with #1 chromic, and divided with cautery.  The uteroovarian ligaments were then clamped, cut, and suture ligated one time on the left side and approximately four clamp, cut, and ties were required on the right side secondary to exposure and adhesions.  The anterior leaf of the broad ligament was then opened and the bladder taken down.  The uterine artery and vein on the left side were more easily exposed, clamped, cut, and suture ligated with #1 chromic.  It was not possible to obtain  visualization of the right side without dealing with a very large posterior myoma.  A partial myomectomy was performed, enough to be able to lift the uterus fully out of the pelvis.  At this time, the uterine artery and vein were clamped, cut, and suture ligated with #1 chromic.  The cardinal ligaments on both sides taken, clamped, cut, and suture ligated with #1 chromic.  The uterus was then amputated.  After the uterus had been elevated, it was noted that the right tube and ovary were significantly adherent to the posterior aspect of the uterus.  The adhesions were freed to perform the rest of the hysterectomy.  It was noted that the dissection had continued further on the right side.  One additional bite of the cardinal ligament was taken on the left side clamped, cut, and suture ligated with #1 chromic.  More of the lower uterus top of the cervix was then excised by cautery.  The endocervical canal was then gently cauterized.  The cervical stump was then cauterized, angle stitches of #1 chromic placed, and figure-of-eight #1 chromic sutures placed across the cervical stump to embrocate it.  Hemostasis was noted to be intact.  The bladder was out of harms way.  The right tube and ovary still contained significant adhesions. These were taken down.  It appeared that the tube and ovary were both embarrassed by the adhesions and probably decreased vascular support, and the decision was made to perform a right salpingo-oophorectomy.  The ureter was identified, the infundibulopelvic ligament clamped, cut, and doubly free tied with #1 chromic.  The left tube and ovary appeared to be quite normal.  The pelvis was washed with copious amounts of warm lactated Ringers solution, hemostasis meticulously obtained, and at the completion of the procedure, an effort was made to place the large bowel in the cul-de-sac.  The omentum was brought down.  The abdomen was closed in layers using a running 2-0  Vicryl on the peritoneum, running 0 Vicryl at the midline bilaterally on the fascia. The subcutaneous tissue was closed with 3-0 Vicryl, and the skin was closed with staples.  The estimated blood loss was approximately 350 cc.  The sponge, needle, and instrument counts were correct x 2.  The patient tolerated the procedure well and was taken to the recovery room in satisfactory condition. Dictated by:   Leatha Gilding. Mezer, M.D. Attending Physician:  Rolinda Roan DD:  11/16/01 TD:  11/16/01 Job: [id number]

## 2011-02-12 ENCOUNTER — Other Ambulatory Visit (INDEPENDENT_AMBULATORY_CARE_PROVIDER_SITE_OTHER): Payer: PRIVATE HEALTH INSURANCE

## 2011-02-12 DIAGNOSIS — IMO0001 Reserved for inherently not codable concepts without codable children: Secondary | ICD-10-CM

## 2011-02-12 DIAGNOSIS — T887XXA Unspecified adverse effect of drug or medicament, initial encounter: Secondary | ICD-10-CM

## 2011-02-12 LAB — HEPATIC FUNCTION PANEL
ALT: 24 U/L (ref 0–35)
AST: 21 U/L (ref 0–37)
Alkaline Phosphatase: 102 U/L (ref 39–117)
Bilirubin, Direct: 0.1 mg/dL (ref 0.0–0.3)
Total Protein: 7.6 g/dL (ref 6.0–8.3)

## 2011-02-12 LAB — LIPID PANEL: Total CHOL/HDL Ratio: 3

## 2011-03-22 ENCOUNTER — Encounter: Payer: Self-pay | Admitting: Family Medicine

## 2011-03-22 ENCOUNTER — Ambulatory Visit (INDEPENDENT_AMBULATORY_CARE_PROVIDER_SITE_OTHER): Payer: PRIVATE HEALTH INSURANCE | Admitting: Family Medicine

## 2011-03-22 ENCOUNTER — Ambulatory Visit (INDEPENDENT_AMBULATORY_CARE_PROVIDER_SITE_OTHER)
Admission: RE | Admit: 2011-03-22 | Discharge: 2011-03-22 | Disposition: A | Discharge status: Home / Self Care | Payer: PRIVATE HEALTH INSURANCE | Source: Ambulatory Visit | Attending: Family Medicine | Admitting: Family Medicine

## 2011-03-22 VITALS — BP 140/100 | Temp 99.3°F | Wt 292.2 lb

## 2011-03-22 DIAGNOSIS — J069 Acute upper respiratory infection, unspecified: Secondary | ICD-10-CM | POA: Insufficient documentation

## 2011-03-22 MED ORDER — AMOXICILLIN 500 MG PO CAPS: 500.0000 mg | ORAL_CAPSULE | Freq: Two times a day (BID) | ORAL | Status: AC

## 2011-03-22 MED ORDER — BENZONATATE 200 MG PO CAPS: 200.0000 mg | ORAL_CAPSULE | Freq: Three times a day (TID) | ORAL | Status: AC | PRN

## 2011-03-22 NOTE — Assessment & Plan Note (Signed)
Pt w/out obvious bacterial infxn on PE but given 7 day duration w/out improvement will start amox.  Cough meds prn.  Reviewed supportive care and red flags that should prompt return.  Pt expressed understanding and is in agreement w/ plan.

## 2011-03-22 NOTE — Patient Instructions (Signed)
Take the amoxicillin as directed for your upper respiratory infection Use the cough pills as needed Add Mucinex to thin your chest congestion Apply vasoline to the inside of your nose to prevent bleeding and dryness Drink plenty of fluids REST! Call with any questions or concerns Hang in there!!!

## 2011-03-22 NOTE — Progress Notes (Signed)
  Subjective:    Patient ID: Sheila Ferguson, female    DOB: [birth year], 50 y.o.   MRN: [id number]  HPI URI- sxs started 1 week ago.  Reports fevers, chills, sore throat, cough- productive of greenish, blood tinge sputum.  Fever is subjective.  No known sick contacts.  Has taken Nyquil w/out relief, Vicks, 'diabetes cough med'.   Review of Systems For ROS see HPI     Objective:   Physical Exam  Constitutional: She appears well-developed and well-nourished. No distress.  HENT:  Head: Normocephalic and atraumatic.  Nose: Right sinus exhibits no maxillary sinus tenderness and no frontal sinus tenderness. Left sinus exhibits no maxillary sinus tenderness and no frontal sinus tenderness.       TMs normal bilaterally Mild nasal congestion, R nostril w/ visible scab anteriorly Throat w/out erythema, edema, or exudate  Eyes: Conjunctivae and EOM are normal. Pupils are equal, round, and reactive to light.  Neck: Normal range of motion. Neck supple.  Cardiovascular: Normal rate, regular rhythm, normal heart sounds and intact distal pulses.   No murmur heard. Pulmonary/Chest: Effort normal and breath sounds normal. No respiratory distress. She has no wheezes.       + hacking cough  Lymphadenopathy:    She has no cervical adenopathy.          Assessment & Plan:

## 2011-04-05 ENCOUNTER — Encounter: Payer: Self-pay | Admitting: Internal Medicine

## 2011-04-05 ENCOUNTER — Ambulatory Visit (INDEPENDENT_AMBULATORY_CARE_PROVIDER_SITE_OTHER): Payer: PRIVATE HEALTH INSURANCE | Admitting: Internal Medicine

## 2011-04-05 DIAGNOSIS — IMO0001 Reserved for inherently not codable concepts without codable children: Secondary | ICD-10-CM

## 2011-04-05 DIAGNOSIS — I10 Essential (primary) hypertension: Secondary | ICD-10-CM

## 2011-04-05 NOTE — Progress Notes (Signed)
  Subjective:    Patient ID: Sheila Ferguson, female    DOB: 1961/02/28, 50 y.o.   MRN: [id number]  HPI  #1 HYPERTENSION: Disease Monitoring: Blood pressure range-not checked  Chest pain, palpitations- no       Dyspnea- no Medications: Compliance- yes  Lightheadedness,Syncope- no    Edema- chronic  #2 DIABETES: Disease Monitoring: Blood Sugar ranges-FBS 205-250, previously 325-350 ( before Victoza)  Polyuria/phagia/dipsia/phagia- no       Visual problems- no Medications: Compliance- yes  Hypoglycemic symptoms- no   #3 Lipid Status: Disease Monitoring: See symptoms for Hypertension  Medications: Compliance- not on statin  Abd pain, bowel changes- loose - watery stool since Victoza started   Muscle aches- no  ROS See HPI above   PMH Smoking Status noted :never    Lab results were reviewed; renal and liver  function are normal. Lipids are at goal(HDL 53.6 and LDL 81)  A1c is 8.5%;previously 9.1% ( 215, risk 82%). A1c of 8.5% would be an average sugar 197 and increased risk of 70%.      Review of Systems     Objective:   Physical Exam Gen.:  well-nourished in appearance. Alert, appropriate and cooperative throughout exam. Eyes: No corneal or conjunctival inflammation noted.  Neck: No deformities, masses, or tenderness noted.  Thyroid normal. Lungs: Normal respiratory effort; chest expands symmetrically. Lungs are clear to auscultation without rales, wheezes, or increased work of breathing. Heart: Normal rate and rhythm. Normal S1 and S2. No gallop, click, or rub. S4 , no murmur.                                                                               Musculoskeletal/extremities:  No clubbing, cyanosis,  or deformity noted. Trace edema. Nail health  Good (painted). Vascular: Carotid, radial artery, dorsalis pedis and  posterior tibial pulses are full and equal. No bruits present. Neurologic: Alert and oriented x3. Deep tendon reflexes symmetrical and  normal. Decreased light touch R foot.         Skin: Intact without suspicious lesions or rashes. Dry skin over feet Lymph: No cervical, axillary lymphadenopathy present. Psych: Mood and affect are normal. Normally interactive                                                                                         Assessment & Plan:  #1 uncontrolled diabetes; there has been some improvement. She states that she is focused on improving diabetic control.  #2 hypertension borderline control  #3 decrease sensation right foot, probable diabetic neuropathy  #4 lipids at goal  Plan: Referred to [facility]  diabetes management program.

## 2011-04-05 NOTE — Patient Instructions (Signed)
Diabetes Monitor     The A1c test is used primarily to monitor the glucose control of diabetics over time. The goal of those with diabetes is to keep their blood glucose levels as close to normal as possible. This helps to minimize the complications caused by chronically elevated glucose levels, such as progressive damage to body organs like the kidneys, eyes, cardiovascular system, and nerves. The A1c test gives a picture of the average amount of glucose in the blood over the last few months. It can help a patient and his doctor know if the measures they are taking to control the patient's diabetes are successful or need to be adjusted.     NORMAL VALUES  Non diabetic adults: 5 %-6.1%  Good diabetic control: 6.2-6.4 %  Fair diabetic control: 6.5-7%  Poor diabetic control: greater than 7 % ( except with additional factors such as  advanced age; significant coronary or neurologic disease,etc). Check the A1c every 6 months if it is < 6.5%; every 4 months if  6.5% or higher. Goals for home glucose monitoring are : fasting  or morning glucose goal of  90-150. Two hours after any meal , goal = < 180, preferably < 150.   Eat a low-fat diet with lots of fruits and vegetables, up to 7-9 servings per day. Avoid obesity; your goal is waist measurement < 35 inches.Consume less than 30 grams of sugar per day from foods & drinks with High Fructose Corn Sugar as #1,2,3 or # 4 on label. Follow the low carb nutrition program in [place] as closely as possible to prevent Diabetes progression & complications. White carbohydrates (potatoes, rice, bread, and pasta) have a high spike of sugar and a high load of sugar. For example a  baked potato has a cup of sugar and a  french fry  2 teaspoons of sugar. Yams, wild  rice, whole grained bread &  wheat pasta have been much lower spike and load of  sugar. Portions should be the size of a deck of cards or your palm.   Sheila Ferguson [facility] to enter  the diabetes management program conducted at the hospital.

## 2011-04-28 ENCOUNTER — Telehealth: Payer: Self-pay

## 2011-04-28 NOTE — Telephone Encounter (Signed)
yes

## 2011-04-28 NOTE — Telephone Encounter (Signed)
Patient is currently at 1.2 Victozia and ? If its ok to increase to 1.8 (DM educator feels this would better help with Blood sugar control)

## 2011-04-28 NOTE — Telephone Encounter (Signed)
Spoke with patient, patient will start new dose when she re-orders medication

## 2011-06-11 ENCOUNTER — Other Ambulatory Visit: Payer: Self-pay | Admitting: Internal Medicine

## 2011-06-11 LAB — GLUCOSE, CAPILLARY: Glucose-Capillary: 158 — ABNORMAL HIGH

## 2011-06-11 LAB — LIPASE, BLOOD: Lipase: 34

## 2011-06-11 LAB — URINALYSIS, ROUTINE W REFLEX MICROSCOPIC
Bilirubin Urine: NEGATIVE
Ketones, ur: NEGATIVE
Nitrite: NEGATIVE
Protein, ur: NEGATIVE
Urobilinogen, UA: 0.2
pH: 6

## 2011-06-11 LAB — DIFFERENTIAL
Basophils Absolute: 0
Basophils Relative: 0
Eosinophils Absolute: 0.3
Eosinophils Relative: 3

## 2011-06-11 LAB — CBC
MCHC: 34
Platelets: 385
RDW: 13.6

## 2011-06-11 LAB — COMPREHENSIVE METABOLIC PANEL
ALT: 28
Albumin: 3.6
Calcium: 9
GFR calc Af Amer: >60
Glucose, Bld: 155 — ABNORMAL HIGH
Sodium: 137
Total Protein: 7.6

## 2011-06-11 LAB — POCT CARDIAC MARKERS: Troponin i, poc: <0.05

## 2011-06-11 LAB — TROPONIN I: Troponin I: 0.01

## 2011-06-11 LAB — CK TOTAL AND CKMB (NOT AT ARMC): Relative Index: 0.3

## 2011-07-01 LAB — POCT CARDIAC MARKERS
Myoglobin, poc: 72.2
Operator id: 4661

## 2011-07-01 LAB — BASIC METABOLIC PANEL
BUN: 9
BUN: 9
Chloride: 106
Creatinine, Ser: 0.92
Creatinine, Ser: 0.85
GFR calc non Af Amer: >60
GFR calc non Af Amer: >60
Glucose, Bld: 143 — ABNORMAL HIGH
Potassium: 3 — ABNORMAL LOW

## 2011-07-01 LAB — LIPID PANEL
LDL Cholesterol: 102 — ABNORMAL HIGH
Total CHOL/HDL Ratio: 4.6
Triglycerides: 66
VLDL: 13

## 2011-07-01 LAB — CBC
HCT: 38.7
Platelets: 457 — ABNORMAL HIGH
WBC: 10.9 — ABNORMAL HIGH

## 2011-07-01 LAB — CK TOTAL AND CKMB (NOT AT ARMC)
CK, MB: 1.3
Total CK: 118

## 2011-07-01 LAB — PROTIME-INR
INR: 0.9
Prothrombin Time: 12.7

## 2011-07-01 LAB — TROPONIN I: Troponin I: 0.03

## 2011-07-01 LAB — DIFFERENTIAL
Lymphocytes Relative: 39
Lymphs Abs: 4.2 — ABNORMAL HIGH
Neutrophils Relative %: 53

## 2011-07-01 LAB — CARDIAC PANEL(CRET KIN+CKTOT+MB+TROPI)
Troponin I: 0.02
Troponin I: 0.02

## 2011-07-01 LAB — D-DIMER, QUANTITATIVE: D-Dimer, Quant: 0.29

## 2011-07-12 ENCOUNTER — Other Ambulatory Visit: Payer: Self-pay | Admitting: Internal Medicine

## 2011-07-12 MED ORDER — DILTIAZEM HCL ER COATED BEADS 240 MG PO CP24: 240.0000 mg | ORAL_CAPSULE | Freq: Every day | ORAL | Status: DC

## 2011-07-12 NOTE — Telephone Encounter (Signed)
RX sent

## 2011-08-10 ENCOUNTER — Other Ambulatory Visit: Payer: Self-pay | Admitting: Internal Medicine

## 2011-08-12 MED ORDER — SITAGLIPTIN PHOS-METFORMIN HCL 50-500 MG PO TABS: 1.0000 | ORAL_TABLET | Freq: Two times a day (BID) | ORAL | Status: DC

## 2011-08-12 NOTE — Telephone Encounter (Signed)
RX sent, patient needs to schedule an A1c 250.00

## 2011-08-24 ENCOUNTER — Other Ambulatory Visit (INDEPENDENT_AMBULATORY_CARE_PROVIDER_SITE_OTHER): Payer: PRIVATE HEALTH INSURANCE

## 2011-08-24 ENCOUNTER — Encounter (INDEPENDENT_AMBULATORY_CARE_PROVIDER_SITE_OTHER): Payer: PRIVATE HEALTH INSURANCE | Admitting: Internal Medicine

## 2011-08-24 DIAGNOSIS — Z0000 Encounter for general adult medical examination without abnormal findings: Secondary | ICD-10-CM

## 2011-08-24 DIAGNOSIS — E119 Type 2 diabetes mellitus without complications: Secondary | ICD-10-CM

## 2011-08-24 NOTE — Progress Notes (Signed)
12  

## 2011-08-25 LAB — HEMOGLOBIN A1C: Hgb A1c MFr Bld: 8.5 % — ABNORMAL HIGH (ref 4.6–6.5)

## 2011-09-10 ENCOUNTER — Other Ambulatory Visit: Payer: Self-pay | Admitting: Internal Medicine

## 2011-09-15 ENCOUNTER — Ambulatory Visit (INDEPENDENT_AMBULATORY_CARE_PROVIDER_SITE_OTHER): Payer: PRIVATE HEALTH INSURANCE | Admitting: Internal Medicine

## 2011-09-15 VITALS — BP 120/100 | HR 108 | Temp 98.3°F | Resp 15 | Ht 64.0 in | Wt 284.2 lb

## 2011-09-15 DIAGNOSIS — IMO0001 Reserved for inherently not codable concepts without codable children: Secondary | ICD-10-CM

## 2011-09-15 DIAGNOSIS — R000 Tachycardia, unspecified: Secondary | ICD-10-CM

## 2011-09-15 LAB — CBC WITH DIFFERENTIAL/PLATELET
Basophils Absolute: 0 10*3/uL (ref 0.0–0.1)
Eosinophils Absolute: 0.1 10*3/uL (ref 0.0–0.7)
Eosinophils Relative: 1.4 % (ref 0.0–5.0)
Lymphs Abs: 3 10*3/uL (ref 0.7–4.0)
MCHC: 33.8 g/dL (ref 30.0–36.0)
MCV: 87.3 fl (ref 78.0–100.0)
Monocytes Absolute: 0.5 10*3/uL (ref 0.1–1.0)
Neutrophils Relative %: 65.9 % (ref 43.0–77.0)
Platelets: 400 10*3/uL (ref 150.0–400.0)
RDW: 14.3 % (ref 11.5–14.6)
WBC: 10.8 10*3/uL — ABNORMAL HIGH (ref 4.5–10.5)

## 2011-09-15 LAB — CREATININE, SERUM: Creatinine, Ser: 0.8 mg/dL (ref 0.4–1.2)

## 2011-09-15 LAB — CALCIUM: Calcium: 9 mg/dL (ref 8.4–10.5)

## 2011-09-15 LAB — MAGNESIUM: Magnesium: 1.8 mg/dL (ref 1.5–2.5)

## 2011-09-15 MED ORDER — SITAGLIPTIN PHOS-METFORMIN HCL 50-500 MG PO TABS: 1.0000 | ORAL_TABLET | Freq: Two times a day (BID) | ORAL | Status: DC

## 2011-09-15 NOTE — Patient Instructions (Signed)
Preventive Health Care: Exercise  30-45  minutes a day, 3-4 days a week. Walking is especially valuable in preventing Osteoporosis.  Eat a low-fat diet with lots of fruits and vegetables, up to 7-9 servings per day. Consume less than 30 grams of sugar per day from foods & drinks with High Fructose Corn Syrup (HFCS) sugar as #1,2,3 or # 4 on label.Whole Foods, [employer] & [employer] do not carry products with HFCS. Follow a  low carb nutrition program such as [place] or [place]  to prevent Diabetes progression . White carbohydrates (potatoes, rice, bread, and pasta) have a high spike of sugar and a high load of sugar. For example a  baked potato has a cup of sugar and a  french fry  2 teaspoons of sugar. Yams, wild  rice, whole grained bread &  wheat pasta have been much lower spike and load of  sugar. Portions should be the size of a deck of cards or your palm.   Please schedule A1c & urine microalbumin in 4 months after nutritional & exercise interventions as discussed.

## 2011-09-15 NOTE — Progress Notes (Signed)
Subjective:    Patient ID: Sheila Ferguson, female    DOB: [birth year], 51 y.o.   MRN: [id number]  HPI Diabetes status assessment : Fasting or morning glucose average : 225  Highest glucose 2 hours after any meal :not checked . Hypoglycemia : no  . ROS: Excess thirst /  hunger  / urination:  no.  Lightheadedness with standing: no . Chest pain:  no . Palpitations: no . Pain in  calves with walking : no . Non healing skin  ulcers or sores : no.  Numbness , tingling or burning in feet :no .                                                                    Significant change in  Weight : 18 # loss since lost job  .  Vision changes:  no  .  Exercise : no.   Nutrition/diet : no.                                                                                Medication compliance:  yes. Adverse  Medication effects:  no . Eye exam : 2 weeks ago; lenses strenghtened; no retinopathy.  Foot care:  no . A1c/ urine microalbumin monitor:  8.5 % .    Review of Systems dry skin   She had 3 teeth resected 5 days ago. She denies any fever or chills. She is having some discomfort related to this & she is on Vicodin     Objective:   Physical Exam Gen.: well-nourished, appropriate and alert, weight excess Eyes: No lid/conjunctival changes, extraocular motion intact Mouth: dental hygiene good Neck: Normal range of motion, thyroid normal Respiratory: No increased work of breathing or abnormal breath sounds Cardiac :Tachycardia( P initially 122 ; 108 on recheck) regular rhythm, no extra heart sounds, gallop murmur Abdomen: No organomegaly ,masses, bruits or aortic enlargement; striae Lymph: No lymphadenopathy of the neck or axilla Skin: No rashes, lesions, ulcers or ischemic changes. Skin dry Muscle skeletal: no nail changes; joints normal Vasc:All pulses intact, no bruits present Neuro: Decreased deep tendon reflexes, alert & oriented, sensation over feet decreased in R foot Psych: judgment  and insight, mood and affect normal         Assessment & Plan:    #1 diabetes; some optimal control. She has a past history of having rectal issues with metformin but is tolerating Janumet. She is on the highest dose of the Victosa. She states that her nutrition is suboptimal. If the A1c cannot be brought to the 7% range with nutrition and exercise; referral for insulin therapy would be indicated.  #2 tachycardia, possibly related to pain from her oral surgery. Her last TSH was 0.94 in March of 2012. Her EKG revealed a sinus tachycardia with a rate of 138. She had no ischemic changes. Her EKG 11/23/10 also exhibited a tachycardia with a rate of 124. Plan: See orders and recommendations.  If the labs are nondiagnostic; I will recommend cardiology evaluation of the persistent tachycardia.

## 2011-11-26 ENCOUNTER — Ambulatory Visit (INDEPENDENT_AMBULATORY_CARE_PROVIDER_SITE_OTHER): Payer: Self-pay | Admitting: Internal Medicine

## 2011-11-26 ENCOUNTER — Encounter: Payer: Self-pay | Admitting: Dietician

## 2011-11-26 ENCOUNTER — Encounter: Payer: Self-pay | Admitting: Internal Medicine

## 2011-11-26 ENCOUNTER — Ambulatory Visit (HOSPITAL_COMMUNITY)
Admission: RE | Admit: 2011-11-26 | Discharge: 2011-11-26 | Disposition: A | Discharge status: Home / Self Care | Payer: Self-pay | Source: Ambulatory Visit | Attending: Internal Medicine | Admitting: Internal Medicine

## 2011-11-26 VITALS — BP 142/92 | HR 110 | Temp 97.4°F | Wt 283.2 lb

## 2011-11-26 DIAGNOSIS — R000 Tachycardia, unspecified: Secondary | ICD-10-CM | POA: Insufficient documentation

## 2011-11-26 DIAGNOSIS — I10 Essential (primary) hypertension: Secondary | ICD-10-CM

## 2011-11-26 DIAGNOSIS — I498 Other specified cardiac arrhythmias: Secondary | ICD-10-CM

## 2011-11-26 DIAGNOSIS — IMO0001 Reserved for inherently not codable concepts without codable children: Secondary | ICD-10-CM

## 2011-11-26 DIAGNOSIS — Z79899 Other long term (current) drug therapy: Secondary | ICD-10-CM

## 2011-11-26 LAB — POCT GLYCOSYLATED HEMOGLOBIN (HGB A1C): Hemoglobin A1C: 10.4

## 2011-11-26 LAB — GLUCOSE, CAPILLARY: Glucose-Capillary: 371 mg/dL — ABNORMAL HIGH (ref 70–99)

## 2011-11-26 MED ORDER — METFORMIN HCL 500 MG PO TABS: 500.0000 mg | ORAL_TABLET | Freq: Two times a day (BID) | ORAL | Status: DC

## 2011-11-26 MED ORDER — INSULIN NPH (HUMAN) (ISOPHANE) 100 UNIT/ML ~~LOC~~ SUSP: SUBCUTANEOUS | Status: DC

## 2011-11-26 MED ORDER — CITALOPRAM HYDROBROMIDE 20 MG PO TABS: 20.0000 mg | ORAL_TABLET | Freq: Every day | ORAL | Status: DC

## 2011-11-26 MED ORDER — RELION LANCETS MISC: Status: DC

## 2011-11-26 MED ORDER — INSULIN PEN NEEDLE 31G X 6 MM MISC: Status: DC

## 2011-11-26 NOTE — Progress Notes (Signed)
History of present illness: Sheila Ferguson is a 51 year old woman with past medical history of type 2 diabetes, migraine headache, hypertension, anal fissure, GERD, sinus tachycardia presents today as a new patient to our office. She has been coughing with yellow-green color sputum (no blood in sputum), subjective fever (37.9), head congestion, runny nose, post nasal drip.  She reports being around sick child on Monday. Denies headache, diarrhea. Vomiting, SOB, chest pain. She has been taking Nyquil & Tessalon Pearl x 1 week.    DM: HbA1c is 10.4 today and previously 8.5 in January.  She has been compliant with her medications except for Victoza because she does not have any insurance.  She took Metformin in the past but had anal fissure.    Tachycardia: just recently, started after she lost her job.  She can hear heart palpitation occasionally with chest pain and SOB.  Mother had a heart attack when she was in 44's.    She had questionable stroke about 4 years ago and is on aspirin.  I reviewed MRI/MRA of brain in 2009- essentially unremarkable.  Denies any smoking, alcohol, illicit drugs  Social History: Single, no children  FH: Mother has DM, CAD 33's.  Sisters-DM SH: Total hysterectomy 11/2001, Cholecystectomy 03/1996, Kidney stone removal via lithotripsy in 2004  Allergies: Azithromycin: patient does not really know Cefotan: vomiting Metformin: diarrhea    Review of system: As per history of present illness  Physical examination: General: alert, well-developed, and cooperative to examination.   Lungs: normal respiratory effort, no accessory muscle use, normal breath sounds, no crackles, and no wheezes. Heart: tachycardic, regular rhythm, no murmur, no gallop, and no rub.  Abdomen: soft, non-tender, normal bowel sounds, no distention, no guarding, no rebound tenderness, no hepatomegaly, and no splenomegaly.  Msk: no joint swelling, no joint warmth, and no redness over joints.  Pulses: 2+  DP/PT pulses bilaterally. Extremities: No cyanosis, clubbing, +1 nonpitting edema Neurologic: alert & oriented X3, cranial nerves II-XII intact, strength normal in all extremities and gait normal. Decreased sensation on right foot compare to left.

## 2011-11-26 NOTE — Assessment & Plan Note (Addendum)
Pulse was 113 on repeat.  She endorses anxiety/panic attack bc she recently lost her job.  She also has a history of depression in which she took natural herbs which helped in the past.  She denies any chest pain at this time. -Will get EKG-sinus tachycardia with rate 109 -TSH in Jan 2013 was wnl -If persist, will need 24 hr holter monitor/2D echocardiogram and may need to refer to cardiology -Will try Celexa 20mg  one tablet daily to see if it helps with her anxiety/depression which may help with her heart rate.  Side effects including increase in depression, sadness, or suicidal ideation were discussed with patient and she voiced her understanding to stop the med should she experience these symptoms and to notify our office. -Will re-assess in 1 month

## 2011-11-26 NOTE — Patient Instructions (Addendum)
Continue taking your Janumet until you finish and then start metformin 500mg  one tablet daily and if you can tolerate it, then you can increase it to 2 tablets daily Continue current medications Will speak to [facility]- diabetic educator about insulin Please DIET & EXERCISE  Start taking Novolin N 20 units at bedtime   If your fasting blood sugar in the morning (before you eat) is not between above 150, you can increase 5 units every 5 days ( to a total of 30 units daily until you see me next month).  If it is below 70, I want you to decrease the Lantus by 5 units as well.  Our goal is to keep your blood sugar between 70-150 when you first wake up in the morning fasting.   When your blood sugar is less than 70 or you start feeling shaky, tremor, or sweating, you can drink 4-8 ounces of apple or orange juice, eat crackers, or take a glucose tablet which you can buy over the counter Remember to check your blood sugar when you get up in the morning and also 2-3 times per day Start Celexa 20mg  one tablet daily  Follow up with Dr. Anselm Jungling in 1 months

## 2011-11-26 NOTE — Assessment & Plan Note (Addendum)
Poorly controlled. Hemoglobin A1c today is 10.4. Patient has been taking Janumet and stopped Victoza because she no longer has insurance. Patient is reluctant about starting insulin because fear of needles. -She  will continue to take Janumet until she runs out and then try to restart Metformin even though it did give her diarrhea in the past.  I explained to patient that Metformin is also in Janumet and she has been tolerating it well.  Will start slowly with just 500mg  once daily and then she can titrate up to BID.   -Will have her talk to [facility] today about insulin teaching because I think she will need insulin to lower her HbA1c. -Continue diet and exercise- we had a long discussion about being compliant with diet and exercise which will help with her DM control -If she decides not to go with insulin, I will add Glipizide to her regimen -Continue Lisinopril 20mg  daily -Urine micro alb/Cr was 2.1 in 01/2011- will not repeat today -Will need to follow up with ophthalmology yearly -Foot exam-wnl today with mild decrease in sensation on right foot  Addendum: she agrees to start insulin after talking to Lupita Leash and received insulin teaching technique.  Will start Novolin N 20 units at bedtime and I instructed patient to titrate up as follow:  "If your fasting blood sugar in the morning (before you eat) is not between above 150, you can increase 5 units every 5 days ( to a total of 30 units daily until you see me next month).  If it is below 70, I want you to decrease the Lantus by 5 units as well.  Our goal is to keep your blood sugar between 70-150 when you first wake up in the morning fasting.   When your blood sugar is less than 70 or you start feeling shaky, tremor, or sweating, you can drink 4-8 ounces of apple or orange juice, eat crackers, or take a glucose tablet which you can buy over the counter Remember to check your blood sugar when you get up in the morning and also 2-3 times per  day"  Will adjust insulin next office visit in 1 month.  I may switch her to flexpen- Lantus/Levemir next time once she gets approved for orange card and MAP

## 2011-11-26 NOTE — Progress Notes (Signed)
Patient ID: Sheila Ferguson, female   DOB: March 07, 1961, 51 y.o.   MRN: [id number] Insulin Training done with patient using vial and syringe. Patient repeated back demonstration without difficulty.  Gave herself and injection of sterile saline. Written steps provided along with sample of 10 30cc, 31 gauge, 8mm syringes in an insulin starter kit which also contains glucose tablets.  Trained patient on signs, symptoms, and appropriate treatment  of hypoglycemia.

## 2011-11-26 NOTE — Assessment & Plan Note (Addendum)
Not well -controlled today which is likely 2/2 to her cold medications she has been taking in the past 1 week.  I reviewed her previous BPs and they were well-controlled. Repeat BP was 142/92. Pulse 110 -Will not change her BP regimen today, if it continues to be elevated next office visit, I will add another agent such as hctz.

## 2011-12-28 ENCOUNTER — Encounter: Payer: Self-pay | Admitting: Internal Medicine

## 2011-12-28 ENCOUNTER — Ambulatory Visit (INDEPENDENT_AMBULATORY_CARE_PROVIDER_SITE_OTHER): Payer: Self-pay | Admitting: Internal Medicine

## 2011-12-28 VITALS — BP 168/91 | HR 92 | Temp 98.2°F | Wt 287.3 lb

## 2011-12-28 DIAGNOSIS — F341 Dysthymic disorder: Secondary | ICD-10-CM

## 2011-12-28 DIAGNOSIS — I10 Essential (primary) hypertension: Secondary | ICD-10-CM

## 2011-12-28 DIAGNOSIS — F329 Major depressive disorder, single episode, unspecified: Secondary | ICD-10-CM

## 2011-12-28 DIAGNOSIS — J309 Allergic rhinitis, unspecified: Secondary | ICD-10-CM | POA: Insufficient documentation

## 2011-12-28 DIAGNOSIS — F32A Depression, unspecified: Secondary | ICD-10-CM | POA: Insufficient documentation

## 2011-12-28 DIAGNOSIS — F419 Anxiety disorder, unspecified: Secondary | ICD-10-CM

## 2011-12-28 DIAGNOSIS — IMO0001 Reserved for inherently not codable concepts without codable children: Secondary | ICD-10-CM

## 2011-12-28 LAB — BASIC METABOLIC PANEL WITH GFR
BUN: 10 mg/dL (ref 6–23)
Calcium: 9.3 mg/dL (ref 8.4–10.5)
Creat: 0.9 mg/dL (ref 0.50–1.10)
GFR, Est African American: 86 mL/min
GFR, Est Non African American: 75 mL/min
Glucose, Bld: 240 mg/dL — ABNORMAL HIGH (ref 70–99)
Potassium: 3.9 mEq/L (ref 3.5–5.3)

## 2011-12-28 MED ORDER — LISINOPRIL 40 MG PO TABS: 40.0000 mg | ORAL_TABLET | Freq: Every day | ORAL | Status: DC

## 2011-12-28 MED ORDER — DILTIAZEM HCL ER COATED BEADS 240 MG PO CP24: 240.0000 mg | ORAL_CAPSULE | Freq: Every day | ORAL | Status: DC

## 2011-12-28 MED ORDER — LORATADINE 10 MG PO TABS: 10.0000 mg | ORAL_TABLET | Freq: Every day | ORAL | Status: DC | PRN

## 2011-12-28 MED ORDER — INSULIN NPH (HUMAN) (ISOPHANE) 100 UNIT/ML ~~LOC~~ SUSP: SUBCUTANEOUS | Status: DC

## 2011-12-28 NOTE — Assessment & Plan Note (Addendum)
Not well controlled. Repeat BP today was 168/91. -Will increase Lisinopril to 40mg  qd before adding another agent  -Continue Diltiazem 240mg  qd -Continue asa -If still elevated, will add Maxzide (given her hx of muscle cramping likely due to hypokalemia with diuretics in the past) -Will get BMP today to make sure her electrolytes are ok -Follow up in 1 wk for repeat BMP and then 2 months with me

## 2011-12-28 NOTE — Patient Instructions (Signed)
Increase Novolin to 30 units at bedtime Increase Lisinopril to 40mg  one tablet daily Start taking Claritin 10mg  one tablet daily Return in 1 week for lab work and BP check  Get labwork today and I will call you with any abnormal results Follow up with Dr Anselm Jungling in 2 months

## 2011-12-28 NOTE — Assessment & Plan Note (Signed)
Zyrtec makes her feel drowsy. -Will try Claritin/Allegra -She will not able to afford Flonase -If continue to worsen, may consider Kenalog injection

## 2011-12-28 NOTE — Assessment & Plan Note (Addendum)
Well controlled on Celexa. She is very pleased with this new medication, able to sleep well at night.  Denies any increased in depression, or SI/HI. -Will continue Celexa 20mg  daily

## 2011-12-28 NOTE — Assessment & Plan Note (Signed)
Improving, her CBG today was 290.  I reviewed her meter today, ranging from 150-290's with fasting blood sugar in 200's.   -Will increase Novolin 30 units qhs -Continue Metformin 500mg  bid -Check blood sugar at least 2-3 times daily -Increase ACEi to Lisinopril 40mg  for better BP control -Follow up in 2 months for repeat HbA1c

## 2011-12-28 NOTE — Progress Notes (Signed)
HPI: She is doing well, feeling much better since she started the insulin and celexa.  She reports that her mood is great and is now able to sleep throughout the night which she has not been able to for many years. Anxiety is also improved. She checks her BS about 2 times daily but have not checked it frequently as she should in the past week C/o of itchy eyes, sneezing, nasal drainage, with dry cough.  She denies any SOB or chest pain.  She tried Zyrtec but it makes her sleepy. She has been taking Novolin 25 units daily and Metformin 500mg  po bid.  She does have diarrhea after taking Metformin but it is tolerable and no anal fissure. She had Papsmear at GYN office last December Had colonoscopy for anal fissure less than 10 yrs ago Also had mammogram last year Does not really want pneumovax at this time because lack of insurance. She is not approved for orange card    ROS: as per HPI  PE: Physical examination:  General: alert, well-developed, and cooperative to examination.  Lungs: normal respiratory effort, no accessory muscle use, normal breath sounds, no crackles, and no wheezes. Heart: tachycardic, regular rhythm, no murmur, no gallop, and no rub.  Abdomen: soft, non-tender, normal bowel sounds, no distention, no guarding, no rebound tenderness, no hepatomegaly, and no splenomegaly.  Msk: no joint swelling, no joint warmth, and no redness over joints.  Pulses: 2+ DP/PT pulses bilaterally. Extremities: No cyanosis, clubbing, +1 nonpitting edema Neurologic: alert & oriented X3, cranial nerves II-XII intact, strength normal in all extremities and gait normal. Decreased sensation on right foot compare to left.

## 2012-01-04 ENCOUNTER — Other Ambulatory Visit (INDEPENDENT_AMBULATORY_CARE_PROVIDER_SITE_OTHER): Payer: Self-pay

## 2012-01-04 DIAGNOSIS — I10 Essential (primary) hypertension: Secondary | ICD-10-CM

## 2012-01-04 LAB — BASIC METABOLIC PANEL
BUN: 10 mg/dL (ref 6–23)
Calcium: 9 mg/dL (ref 8.4–10.5)
Creat: 0.83 mg/dL (ref 0.50–1.10)

## 2012-01-25 NOTE — Progress Notes (Signed)
  Subjective:    Patient ID: Sheila Ferguson, female    DOB: July 12, 1961, 51 y.o.   MRN: [id number]  HPI    Review of Systems     Objective:   Physical Exam        Assessment & Plan:  See Staff notation concerning appt This encounter was created in error - please disregard.

## 2012-02-29 ENCOUNTER — Encounter: Payer: Self-pay | Admitting: Internal Medicine

## 2012-03-01 ENCOUNTER — Encounter: Payer: Self-pay | Admitting: Internal Medicine

## 2012-04-03 ENCOUNTER — Telehealth: Payer: Self-pay | Admitting: *Deleted

## 2012-04-03 NOTE — Telephone Encounter (Signed)
Pt went to [facility] to refill Cardizem CD - over $100.00. Has no income. Leaving town - needs Rx. Pt wanted another Rx she could afford. Stanton Kidney Ica Daye RN 04/03/12 3:10PM

## 2012-04-04 NOTE — Telephone Encounter (Signed)
Do they cover for the regular Cardizem at all?  What other substitution will her insurance cover?

## 2012-04-06 NOTE — Telephone Encounter (Signed)
Called pt and states she is in [place] for the weekend - will be back  04/10/12 - will call clinic. Has no [employer] - earns too much unemployment. Has to pay out of pocket for meds. Not sure if she would be able to go through the MAP program. Stanton Kidney Makiyla Linch RN 04/06/12 11:05AM

## 2012-05-30 ENCOUNTER — Ambulatory Visit (INDEPENDENT_AMBULATORY_CARE_PROVIDER_SITE_OTHER): Payer: Self-pay | Admitting: Internal Medicine

## 2012-05-30 ENCOUNTER — Ambulatory Visit (INDEPENDENT_AMBULATORY_CARE_PROVIDER_SITE_OTHER): Payer: Self-pay | Admitting: Dietician

## 2012-05-30 ENCOUNTER — Encounter: Payer: Self-pay | Admitting: Internal Medicine

## 2012-05-30 VITALS — BP 165/98 | HR 89 | Temp 98.2°F | Wt 289.0 lb

## 2012-05-30 DIAGNOSIS — Z23 Encounter for immunization: Secondary | ICD-10-CM

## 2012-05-30 DIAGNOSIS — IMO0001 Reserved for inherently not codable concepts without codable children: Secondary | ICD-10-CM

## 2012-05-30 DIAGNOSIS — Z79899 Other long term (current) drug therapy: Secondary | ICD-10-CM

## 2012-05-30 DIAGNOSIS — E119 Type 2 diabetes mellitus without complications: Secondary | ICD-10-CM

## 2012-05-30 DIAGNOSIS — F32A Depression, unspecified: Secondary | ICD-10-CM

## 2012-05-30 DIAGNOSIS — I10 Essential (primary) hypertension: Secondary | ICD-10-CM

## 2012-05-30 DIAGNOSIS — G43909 Migraine, unspecified, not intractable, without status migrainosus: Secondary | ICD-10-CM

## 2012-05-30 DIAGNOSIS — F411 Generalized anxiety disorder: Secondary | ICD-10-CM

## 2012-05-30 DIAGNOSIS — F419 Anxiety disorder, unspecified: Secondary | ICD-10-CM

## 2012-05-30 DIAGNOSIS — F341 Dysthymic disorder: Secondary | ICD-10-CM

## 2012-05-30 LAB — POCT GLYCOSYLATED HEMOGLOBIN (HGB A1C): Hemoglobin A1C: 13.2

## 2012-05-30 LAB — GLUCOSE, CAPILLARY: Glucose-Capillary: 421 mg/dL — ABNORMAL HIGH (ref 70–99)

## 2012-05-30 MED ORDER — INSULIN NPH (HUMAN) (ISOPHANE) 100 UNIT/ML ~~LOC~~ SUSP: SUBCUTANEOUS | Status: DC

## 2012-05-30 MED ORDER — PROPRANOLOL HCL 80 MG PO TABS: 80.0000 mg | ORAL_TABLET | Freq: Two times a day (BID) | ORAL | Status: DC

## 2012-05-30 MED ORDER — LORATADINE 10 MG PO TABS: 10.0000 mg | ORAL_TABLET | Freq: Every day | ORAL | Status: DC | PRN

## 2012-05-30 MED ORDER — GLIPIZIDE 10 MG PO TABS: 10.0000 mg | ORAL_TABLET | Freq: Every day | ORAL | Status: DC

## 2012-05-30 MED ORDER — ACETAMINOPHEN ER 650 MG PO TBCR: 650.0000 mg | EXTENDED_RELEASE_TABLET | Freq: Three times a day (TID) | ORAL | Status: DC | PRN

## 2012-05-30 MED ORDER — CITALOPRAM HYDROBROMIDE 40 MG PO TABS: 40.0000 mg | ORAL_TABLET | Freq: Every day | ORAL | Status: DC

## 2012-05-30 MED ORDER — INSULIN ASPART 100 UNIT/ML ~~LOC~~ SOLN
10.0000 [IU] | Freq: Once | SUBCUTANEOUS | Status: AC
  Administered 2012-05-30: 10 [IU] via SUBCUTANEOUS

## 2012-05-30 NOTE — Assessment & Plan Note (Addendum)
Poorly controlled. HbA1c today is 13.2 with CBG 421.  Feet exam WNL. No ulcers noted. Normal sensation. -Will get CMP today -Give 10 units of Novolog in office and repeat CBG was 308 -Increase Novolin N 40 units qhs -Continue Metformin 500mg  qd or bid -Start Glipizide 10mg  qd -Continue diet and exercise -See Lupita Leash Plyler for diabetic education today -Follow up with me in 2 weeks

## 2012-05-30 NOTE — Addendum Note (Signed)
Addended by: Angelina Ok F on: 05/30/2012 05:28 PM   Modules accepted: Orders

## 2012-05-30 NOTE — Patient Instructions (Addendum)
Just so you remember: Your blood sugar was 308 mg/dl after the 10 units of Novolog insulin today.  I called in syringes, meter and strips [facility].   We talked about using ball to break your sitting time, spreading your food intake out evenly over the day, support system- you are welcome to bring anyone to our visits with me.  ( ? Talk to minister) . BUT the one goal for the next 2 weeks is :  1-Medicine- taking it at the same time each day- set your alarms in phone and (split metformin into half and take 1-2 times a day)

## 2012-05-30 NOTE — Assessment & Plan Note (Signed)
Daily migraine headache in past 3 weeks due to increased stress level. -Will try Tylenol PRN -Start Propranolol for migraine prophylaxis, also for tachycardia, and BP control

## 2012-05-30 NOTE — Addendum Note (Signed)
Addended by: Maura Crandall on: 05/30/2012 04:20 PM   Modules accepted: Orders

## 2012-05-30 NOTE — Patient Instructions (Addendum)
Stop Diltiazem Resume Lisinopril 40mg  one tablet daily Start taking Propranolol 80mg  one tablet twice daily Increase Celexa to 40mg  one tablet daily for your depression You can take Tylenol 650mg  one tablet every 6 hours as needed for headache- do not take more than 4gram per day Start taking Glipizide 10mg  one tablet daily Continue checking your blood sugar 3 times daily and bring meter next visit Continue diet and exercise Make appointment to see Lupita Leash Plyler in 2 weeks Follow up with Dr Anselm Jungling in 2 weeks

## 2012-05-30 NOTE — Progress Notes (Signed)
History of present illness: Sheila Ferguson is a 51 year old woman with past medical history of type 2 diabetes, migraine headache, hypertension, anal fissure, GERD, sinus tachycardia presents today for routine follow up. She has been out of Diltiazem in the past 2 months. She has not been taking Lisinopril x 2 months as well because it has been making her have leg crampings.  She has left arm numbness only when she tries to sleep. +chest tightness with palpitation when she walks, SOB. She sleeps on 4 pillows at night, no PND or orthopnea. Mom had MI at age of 34's and died in her 26's. Mom also had breast cancer. She had her mammogram last year in October.  She had colonoscopy in 2005 due to anal fissure.     'DM: Has been taking Novolin 30 units at bedtime, Metformin 500mg  qd-bid.  Increased stress level.    Depression: feeling more than usual, Celexa is working well.    Headache: sharp pain through her head, not able to sleep. +photophobia and phonophobia. X 3 weeks in duration.  She has been taking ibuprofen for headache with mild relief.    ROS: as per HPI  PE: General: alert, well-developed, and cooperative to examination.  Lungs: normal respiratory effort, no accessory muscle use, normal breath sounds, no crackles, and no wheezes. Heart: normal rate, regular rhythm, no murmur, no gallop, and no rub.  Abdomen: soft, non-tender, normal bowel sounds, no distention, no guarding, no rebound tenderness Msk: no joint swelling, no joint warmth, and no redness over joints.  Pulses: 2+ DP/PT pulses bilaterally Extremities: No cyanosis, clubbing, edema Neurologic: nonfocal Psych: Oriented X3, memory intact for recent and remote, normally interactive, good eye contact, not anxious appearing, and not depressed appearing.

## 2012-05-30 NOTE — Assessment & Plan Note (Signed)
Not well controlled, will increase Celexa to 40mg  qd. Denies any SI/HI

## 2012-05-30 NOTE — Assessment & Plan Note (Addendum)
Poorly controlled. BP on arrival was 190/120.  Repeat BP was 165/98. Patient has been non-compliant with meds in the past several months. -Resume Lisinopril 40mg  qd  -Stop Diltiazem because patient cannot afford it -Start Propranolol for BP and tachycardia and migraine prophylaxis -Will need to add Maxzide next visit because I do not want to drop her BP too much at once. Or resume diltiazem 120mg  bid because this strength is on $4 list at [facility] -Will see her back in 2 weeks, repeat BMP in 2 weeks to make sure her electrolytes are wnl

## 2012-05-31 ENCOUNTER — Encounter: Payer: Self-pay | Admitting: Dietician

## 2012-05-31 LAB — COMPLETE METABOLIC PANEL WITH GFR
Albumin: 4.3 g/dL (ref 3.5–5.2)
Alkaline Phosphatase: 123 U/L — ABNORMAL HIGH (ref 39–117)
BUN: 12 mg/dL (ref 6–23)
CO2: 28 mEq/L (ref 19–32)
Calcium: 9.7 mg/dL (ref 8.4–10.5)
Chloride: 101 mEq/L (ref 96–112)
GFR, Est African American: 86 mL/min
GFR, Est Non African American: 75 mL/min
Glucose, Bld: 350 mg/dL — ABNORMAL HIGH (ref 70–99)
Potassium: 4.4 mEq/L (ref 3.5–5.3)
Sodium: 137 mEq/L (ref 135–145)
Total Protein: 7.3 g/dL (ref 6.0–8.3)

## 2012-05-31 LAB — LIPID PANEL
Cholesterol: 191 mg/dL (ref 0–200)
Total CHOL/HDL Ratio: 3.7 Ratio

## 2012-06-01 NOTE — Progress Notes (Signed)
Diabetes Self-Management Training (DSMT)  Initial Visit  06/01/2012 [name], identified by name and date of birth, is a 51 y.o. female with Type 2 Diabetes.   ASSESSMENT Patient concerns are Problem solving and Glycemic control.  There were no vitals taken for this visit. There is no height or weight on file to calculate BMI. Lab Results  Component Value Date   LDLCALC 111* 05/30/2012   Lab Results  Component Value Date   HGBA1C 13.2 05/30/2012   Labs reviewed.  Family history of diabetes: Yes Patients belief/attitude about diabetes: Diabetes can be controlled. Self foot exams daily: Yes Diabetes Complications: None Support systems: pastor, lives with family who she reports are not supportive in adopting healthy behaviors Special needs: None Prior DM Education: Yes   Medications See Medications list. - not taking metformin when she goes to school because it gives her very loose stools for ~ 30 minutes. Is taking insulin at night and most of her eating is during day with no medicine coverage. having trouble with expense of syringes. Not taking as prescribed, Is interested in learning more and Needs skills/knowledge review   Exercise Plan Doing ADLs and sedentary in school, studying often and in leasure time .   Self-Monitoring Monitor: strips for current meter [facility]- [facility], called in strips and meter at [employer] of testing: 2-3 times/day Breakfast: 200s- 400s most of day Hyperglycemia: Yes Daily Hypoglycemia: No   Meal Planning Some knowledge- followed structured meal plan for 3-4 months then found it too stressful and stopped and stopped going to [facility] because she experienced pressure and more stress when she went there. Encouraged patient to build support system for healthy behaviors she wishes to adopt. pastor at her church is supportive and encourages healthy eating and exercise. Patient  identified drinking less sugary drinks and moving more as goals, but wants to work on taking meds correctly first   Assessment comments: encouraged patient to work on one small change that she wants to do and can do at a time,     INDIVIDUAL DIABETES EDUCATION PLAN:  Nutrition management Physical activity and exercise Medication Monitoring Acute complication: Goal setting Psychosocial adjustment _______________________________________________________________________  Intervention TOPICS COVERED TODAY:  Nutrition management  Role of diet in the treatment of diabetes and the relationship between the three main macronutritents and blood glucose control. Meal timing in regards to the patients' current diabetes medication. Physical activity and exercise  Helped patient identify appropriate exercises in relation to his/her diabetes, diabetes complications and other health issue. Medication  Reviewed patients medication for diabetes, action, purpose, timing of dose and side effects. Goal setting  Helped patient develop diabetes management plan for improving her diabetes care.   PATIENTS GOALS/PLAN (copy and paste in patient instructions so patient receives a copy): 1.  Learning Objective:       State action of current insulin  2.  Behavioral Objective:         Medications: To improve blood glucose levels, I will take my medication as prescribed Sometimes 25%  Personalized Follow-Up Plan for Ongoing Self Management Support:  Doctor's Office, church, friends, family and CDE visits ______________________________________________________________________   Outcomes Expected outcomes: Demonstrated interest in learning.Expect positive changes in lifestyle. Self-care Barriers: Lack of material resources, Coping skills Education material provided: yes Patient to contact team via Phone if problems or questions. Time in: 1600     Time out: 1700 Future DSMT - 1- 2 wks   Sheila Ferguson,  Lupita Leash

## 2012-06-06 ENCOUNTER — Ambulatory Visit: Payer: Self-pay | Admitting: Dietician

## 2012-06-13 ENCOUNTER — Ambulatory Visit (INDEPENDENT_AMBULATORY_CARE_PROVIDER_SITE_OTHER): Payer: Self-pay | Admitting: Dietician

## 2012-06-13 ENCOUNTER — Ambulatory Visit (INDEPENDENT_AMBULATORY_CARE_PROVIDER_SITE_OTHER): Payer: Self-pay | Admitting: Internal Medicine

## 2012-06-13 ENCOUNTER — Encounter: Payer: Self-pay | Admitting: Internal Medicine

## 2012-06-13 ENCOUNTER — Encounter: Payer: Self-pay | Admitting: Dietician

## 2012-06-13 VITALS — BP 170/89 | HR 67 | Temp 98.4°F | Wt 296.0 lb

## 2012-06-13 DIAGNOSIS — IMO0001 Reserved for inherently not codable concepts without codable children: Secondary | ICD-10-CM

## 2012-06-13 DIAGNOSIS — I10 Essential (primary) hypertension: Secondary | ICD-10-CM

## 2012-06-13 MED ORDER — DILTIAZEM HCL ER 120 MG PO CP12: 120.0000 mg | ORAL_CAPSULE | Freq: Two times a day (BID) | ORAL | Status: DC

## 2012-06-13 MED ORDER — PRAVASTATIN SODIUM 20 MG PO TABS: 20.0000 mg | ORAL_TABLET | Freq: Every evening | ORAL | Status: DC

## 2012-06-13 MED ORDER — INSULIN NPH (HUMAN) (ISOPHANE) 100 UNIT/ML ~~LOC~~ SUSP: SUBCUTANEOUS | Status: DC

## 2012-06-13 NOTE — Assessment & Plan Note (Signed)
Poorly controlled. Repeat BP was 170/89.  I recently resumed her Lisinopril 2 weeks ago and started on Propranolol for BP, HR, and migraine control.   -Will resume Diltiazem 120mg  one tablet bid since it worked well for her in the past -Continue Lisinopril 40mg  -Continue Propranolol 80mg  bid -Check BMP today

## 2012-06-13 NOTE — Progress Notes (Signed)
History of present illness: [name] is a 51 year old woman with past medical history of type 2 diabetes, migraine headache, hypertension, anal fissure, GERD, sinus tachycardia presents today for routine follow up. She has been taking Novolin 40 units , metformin 500bid, and glipizide 10mg  qd.  Her blood sugars range from 96-235.  Mild symptoms of nausea/sweating at first but now her body is used to it. She has blurry vision since her blood sugar has been "low" in the 90's.  She states that her body is being used to blood sugar in the 400's.   Last seen opthalmology in 06/2011.  She is compliant with other medications.   ROS: as per HPI   PE:  General: alert, well-developed, and cooperative to examination.  Lungs: normal respiratory effort, no accessory muscle use, normal breath sounds, no crackles, and no wheezes. Heart: normal rate, regular rhythm, no murmur, no gallop, and no rub.  Abdomen: soft, non-tender, normal bowel sounds, no distention, no guarding, no rebound tenderness Msk: no joint swelling, no joint warmth, and no redness over joints.  Pulses: 2+ DP/PT pulses bilaterally Extremities: No cyanosis, clubbing, edema Neurologic: nonfocal Psych: Oriented X3, memory intact for recent and remote, normally interactive, good eye contact, not anxious appearing, and not depressed appearing.

## 2012-06-13 NOTE — Patient Instructions (Addendum)
Great work [employer]!  Your goal for the next month and thereafter is to always have food/ candy/ raisins/ nuts/ cereal like cheerios/ fruit/ canned peaches with you of some sort that can inc reae your blood sugar.   Follow up in 4 weeks   20 units N insulin in am and at bedtime Stop glipizide Metformin stays the same since you are doing such a great job taking it (250mg  in am and 500 mg at 2 Pm with lunch)

## 2012-06-13 NOTE — Patient Instructions (Addendum)
Change Novolin to 20 units before breakfast and 20 units before dinner Stop Glipizide Start Diltiazem 120mg  one tablet twice daily Please make sure you schedule for an eye exam with opthalmology Follow up in 4-6 weeks

## 2012-06-13 NOTE — Progress Notes (Signed)
Diabetes Self-Management Training (DSMT)  Initial Visit  06/13/2012 [name], identified by name and date of birth, is a 51 y.o. female with Type 2 Diabetes.   ASSESSMENT Patient concerns are Problem solving and Glycemic control.  There were no vitals taken for this visit. There is no height or weight on file to calculate BMI. Lab Results  Component Value Date   LDLCALC 111* 05/30/2012   Lab Results  Component Value Date   HGBA1C 13.2 05/30/2012   Labs reviewed.   Medications See Medications list. - taking 40 units at bedtime, 250 mg metformin in am, 500 with lunch and glipizide which is being stopped.  Taking as prescribed, Is interested in learning more and Needs skills/knowledge review   Exercise Plan Doing ADLs and sedentary in school, studying often and in leasure time .   Self-Monitoring Monitor: [employer] Frequency of testing: 2-3 times/day Breakfast: 100s Late evening 200-400 Hyperglycemia: Yes Daily Hypoglycemia: yes- this past Sunday am and middle of the night   Meal Planning Some knowledge-  Assessment comments: Upbeat attitude today. Verbalizes appreciation of support and education. Again encouraged patient to work on one small change that she wants to do and can do at a time,     INDIVIDUAL DIABETES EDUCATION PLAN:  Nutrition management Physical activity and exercise Medication Monitoring Acute complication: Goal setting Psychosocial adjustment _______________________________________________________________________  Intervention TOPICS COVERED TODAY:  Acutr complications: prevention and treatment of hypoglycemia PATIENTS GOALS/PLAN (copy and paste in patient instructions so patient receives a copy): 1.  Learning Objective:       State action of current insulin and importance of eating at least 3 times a day 2.  Behavioral Objective:         Medications: To improve blood glucose levels, I will take my medication as prescribed  Sometimes 100% Acute complications- I will always carry something with me to treat a low blood sugar- 0%.  Personalized Follow-Up Plan for Ongoing Self Management Support:  Doctor's Office, church, friends, family and CDE visits ______________________________________________________________________   Outcomes Expected outcomes: Demonstrated interest in learning.Expect positive changes in lifestyle. Self-care Barriers: Lack of material resources, Coping skills Education material provided: yes Patient to contact team via Phone if problems or questions. Time in: 1600     Time out: 1630 Future DSMT - 4 wks   Raybon Conard, Lupita Leash

## 2012-06-13 NOTE — Assessment & Plan Note (Addendum)
Poorly controlled. HbA1c was 13.2 in 05/2012. Feet exam WNL. No ulcers noted. Normal sensation. She spoke to [employer] last office visit.  Meter reviewed: CBGs range 97-309. Fasting blood sugar in 110's.   -Will split Novolin N to 20 units in AM and 20 units qhs so that she can have coverage during the day as well. -Continue Metformin 500mg  bid  -Stop Glipizide given her hypoglycemic symptoms (although her BS was in 90's during these episodes)  -Continue diet and exercise  -Schedule opthalmology exam

## 2012-06-14 ENCOUNTER — Encounter: Payer: Self-pay | Admitting: Dietician

## 2012-07-24 ENCOUNTER — Encounter (HOSPITAL_COMMUNITY): Payer: Self-pay | Admitting: *Deleted

## 2012-07-24 ENCOUNTER — Emergency Department (HOSPITAL_COMMUNITY)
Admission: EM | Admit: 2012-07-24 | Discharge: 2012-07-24 | Disposition: A | Discharge status: Home / Self Care | Payer: No Typology Code available for payment source | Attending: Emergency Medicine | Admitting: Emergency Medicine

## 2012-07-24 DIAGNOSIS — Q393 Congenital stenosis and stricture of esophagus: Secondary | ICD-10-CM | POA: Insufficient documentation

## 2012-07-24 DIAGNOSIS — Z79899 Other long term (current) drug therapy: Secondary | ICD-10-CM | POA: Insufficient documentation

## 2012-07-24 DIAGNOSIS — M549 Dorsalgia, unspecified: Secondary | ICD-10-CM | POA: Insufficient documentation

## 2012-07-24 DIAGNOSIS — G4730 Sleep apnea, unspecified: Secondary | ICD-10-CM | POA: Insufficient documentation

## 2012-07-24 DIAGNOSIS — Z862 Personal history of diseases of the blood and blood-forming organs and certain disorders involving the immune mechanism: Secondary | ICD-10-CM | POA: Insufficient documentation

## 2012-07-24 DIAGNOSIS — M542 Cervicalgia: Secondary | ICD-10-CM | POA: Insufficient documentation

## 2012-07-24 DIAGNOSIS — Q391 Atresia of esophagus with tracheo-esophageal fistula: Secondary | ICD-10-CM | POA: Insufficient documentation

## 2012-07-24 DIAGNOSIS — R51 Headache: Secondary | ICD-10-CM | POA: Insufficient documentation

## 2012-07-24 DIAGNOSIS — Z794 Long term (current) use of insulin: Secondary | ICD-10-CM | POA: Insufficient documentation

## 2012-07-24 DIAGNOSIS — T07XXXA Unspecified multiple injuries, initial encounter: Secondary | ICD-10-CM | POA: Insufficient documentation

## 2012-07-24 DIAGNOSIS — Y92410 Unspecified street and highway as the place of occurrence of the external cause: Secondary | ICD-10-CM | POA: Insufficient documentation

## 2012-07-24 DIAGNOSIS — I10 Essential (primary) hypertension: Secondary | ICD-10-CM | POA: Insufficient documentation

## 2012-07-24 DIAGNOSIS — Y9389 Activity, other specified: Secondary | ICD-10-CM | POA: Insufficient documentation

## 2012-07-24 DIAGNOSIS — Z8639 Personal history of other endocrine, nutritional and metabolic disease: Secondary | ICD-10-CM | POA: Insufficient documentation

## 2012-07-24 DIAGNOSIS — E119 Type 2 diabetes mellitus without complications: Secondary | ICD-10-CM | POA: Insufficient documentation

## 2012-07-24 MED ORDER — IBUPROFEN 800 MG PO TABS: 800.0000 mg | ORAL_TABLET | Freq: Three times a day (TID) | ORAL | Status: DC | PRN

## 2012-07-24 MED ORDER — DIAZEPAM 5 MG PO TABS: 5.0000 mg | ORAL_TABLET | Freq: Three times a day (TID) | ORAL | Status: DC | PRN

## 2012-07-24 MED ORDER — IBUPROFEN 800 MG PO TABS: 800.0000 mg | ORAL_TABLET | Freq: Once | ORAL | Status: DC
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
## 2012-07-24 NOTE — ED Notes (Signed)
Dischage instructions reviewed. Rx given x2. All questions answered.
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
## 2012-07-25 NOTE — ED Provider Notes (Signed)
Medical screening examination/treatment/procedure(s) were performed by non-physician practitioner and as supervising physician I was immediately available for consultation/collaboration.   Zygmunt Mcglinn L Kobe Jansma, MD 07/25/12 0415 

## 2012-11-20 ENCOUNTER — Other Ambulatory Visit: Payer: Self-pay | Admitting: Internal Medicine

## 2012-11-20 NOTE — Telephone Encounter (Signed)
Flaf sent to front desk pool for appt per Dr Meredith Pel.

## 2012-11-20 NOTE — Telephone Encounter (Signed)
Please schedule a follow-up appointment with patient's PCP. 

## 2012-11-24 ENCOUNTER — Other Ambulatory Visit: Payer: Self-pay | Admitting: *Deleted

## 2012-11-24 MED ORDER — DILTIAZEM HCL 120 MG PO TABS: 120.0000 mg | ORAL_TABLET | Freq: Two times a day (BID) | ORAL | Status: DC

## 2012-12-25 ENCOUNTER — Encounter: Payer: Self-pay | Admitting: Internal Medicine

## 2012-12-25 ENCOUNTER — Ambulatory Visit (INDEPENDENT_AMBULATORY_CARE_PROVIDER_SITE_OTHER): Payer: Self-pay | Admitting: Internal Medicine

## 2012-12-25 VITALS — BP 140/90 | HR 67 | Temp 97.5°F | Wt 305.6 lb

## 2012-12-25 DIAGNOSIS — IMO0001 Reserved for inherently not codable concepts without codable children: Secondary | ICD-10-CM

## 2012-12-25 DIAGNOSIS — I10 Essential (primary) hypertension: Secondary | ICD-10-CM

## 2012-12-25 DIAGNOSIS — Z79899 Other long term (current) drug therapy: Secondary | ICD-10-CM

## 2012-12-25 DIAGNOSIS — E785 Hyperlipidemia, unspecified: Secondary | ICD-10-CM

## 2012-12-25 DIAGNOSIS — M654 Radial styloid tenosynovitis [de Quervain]: Secondary | ICD-10-CM

## 2012-12-25 DIAGNOSIS — M65312 Trigger thumb, left thumb: Secondary | ICD-10-CM | POA: Insufficient documentation

## 2012-12-25 LAB — POCT GLYCOSYLATED HEMOGLOBIN (HGB A1C): Hemoglobin A1C: 9.4

## 2012-12-25 LAB — GLUCOSE, CAPILLARY: Glucose-Capillary: 302 mg/dL — ABNORMAL HIGH (ref 70–99)

## 2012-12-25 MED ORDER — INSULIN NPH (HUMAN) (ISOPHANE) 100 UNIT/ML ~~LOC~~ SUSP: SUBCUTANEOUS | Status: DC

## 2012-12-25 MED ORDER — METFORMIN HCL 500 MG PO TABS: 500.0000 mg | ORAL_TABLET | Freq: Two times a day (BID) | ORAL | Status: DC

## 2012-12-25 MED ORDER — GLIPIZIDE 10 MG PO TABS: 10.0000 mg | ORAL_TABLET | Freq: Every day | ORAL | Status: DC

## 2012-12-25 MED ORDER — LOVASTATIN 20 MG PO TABS: 20.0000 mg | ORAL_TABLET | Freq: Every day | ORAL | Status: DC

## 2012-12-25 NOTE — Patient Instructions (Addendum)
General Instructions:  Please follow-up at the clinic in 1 month, at which time we will reevaluate your diabetes and blood pressure - OR, please follow-up in the clinic sooner if needed.  There have been changes in your medications:  INCREASE your evening NPH insulin to 22 units daily and keep your morning insulin at 20 units.  START taking aspirin daily.  START antiinflammatory medication as needed (Aleve, Ibuprofen, naproxen) for your thumb pain.   Check your blood sugars at least 2 times a day before breakfast and at bedtime.  If you have been started on new medication(s), and you develop symptoms concerning for allergic reaction, including, but not limited to, throat closing, tongue swelling, rash, please stop the medication immediately and call the clinic at [phone], and go to the ER.  If you are diabetic, please bring your meter to your next visit.  If symptoms worsen, or new symptoms arise, please call the clinic or go to the ER.  PLEASE BRING ALL OF YOUR MEDICATIONS  IN A BAG TO YOUR NEXT APPOINTMENT  LIFESTYLE TIPS TO HELP WITH YOUR BLOOD PRESSURE CONTROL  WEIGHT REDUCTION:  Strategies: A healthy weight loss program includes:  A calorie restricted diet based on individual calorie needs.   Increased physical activity (exercise).  An exercise program is just as important as the right low-calorie diet.    An unhealthy weight loss program includes:  Fasting.   Fad diets.   Supplements and drugs.  These choices do not succeed in long-term weight control.   Home Care Instructions: To help you make the needed dietary changes:   Exercise and perform physical activity as directed by your caregiver.   Keep a daily record of everything you eat. There are many free websites to help you with this. It may be helpful to measure your foods so you can determine if you are eating the correct portion sizes.   Use low-calorie cookbooks or take special cooking classes.    Avoid alcohol. Drink more water and drinks with no calories.   Take vitamins and supplements only as recommended by your caregiver.   Weight loss support groups, Registered Dieticians, counselors, and stress reduction education can also be very helpful.   ________________________________________________________________________  DASH DIET:  The DASH diet stands for "Dietary Approaches to Stop Hypertension." It is a healthy eating plan that has been shown to reduce high blood pressure (hypertension) in as little as 14 days, while also possibly providing other significant health benefits. These other health benefits include reducing the risk of breast cancer after menopause and reducing the risk of type 2 diabetes, heart disease, colon cancer, and stroke. Health benefits also include weight loss and slowing kidney failure in patients with chronic kidney disease.   Diet guidelines: Limit salt (sodium). Your diet should contain less than 1500 mg of sodium daily.  Limit refined or processed carbohydrates. Your diet should include mostly whole grains. Desserts and added sugars should be used sparingly.  Include small amounts of heart-healthy fats. These types of fats include nuts, oils, and tub margarine. Limit saturated and trans fats. These fats have been shown to be harmful in the body.   Choosing Foods: The following food groups are based on a 2000 calorie diet. See your Registered Dietitian for individual calorie needs.  Grains and Grain Products (6 to 8 servings daily)  Eat More Often: Whole-wheat bread, brown rice, whole-grain or wheat pasta, quinoa, popcorn without added fat or salt (air popped).  Eat Less Often: White  bread, white pasta, white rice, cornbread.  Vegetables (4 to 5 servings daily)  Eat More Often: Fresh, frozen, and canned vegetables. Vegetables may be raw, steamed, roasted, or grilled with a minimal amount of fat.  Eat Less Often/Avoid: Creamed or fried vegetables.  Vegetables in a cheese sauce.  Fruit (4 to 5 servings daily)  Eat More Often: All fresh, canned (in natural juice), or frozen fruits. Dried fruits without added sugar. One hundred percent fruit juice ( cup [237 mL] daily).  Eat Less Often: Dried fruits with added sugar. Canned fruit in light or heavy syrup.  [employer], Fish, and Poultry (2 servings or less daily. One serving is 3 to 4 oz [85-114 g]).  Eat More Often: Ninety percent or leaner ground beef, tenderloin, sirloin. Round cuts of beef, chicken breast, [place] breast. All fish. Grill, bake, or broil your meat. Nothing should be fried.  Eat Less Often/Avoid: Fatty cuts of meat, [place], or chicken leg, thigh, or wing. Fried cuts of meat or fish.  Dairy (2 to 3 servings)  Eat More Often: Low-fat or fat-free milk, low-fat plain or light yogurt, reduced-fat or part-skim cheese.  Eat Less Often/Avoid: Milk (whole, 2%, skim, or chocolate). Whole milk yogurt. Full-fat cheeses.  Nuts, Seeds, and Legumes (4 to 5 servings per week)  Eat More Often: All without added salt.  Eat Less Often/Avoid: Salted nuts and seeds, canned beans with added salt.  Fats and Sweets (limited)  Eat More Often: Vegetable oils, tub margarines without trans fats, sugar-free gelatin. Mayonnaise and salad dressings.  Eat Less Often/Avoid: Coconut oils, palm oils, butter, stick margarine, cream, half and half, cookies, candy, pie.   Treatment Goals:  Goals (1 Years of Data) as of 12/25/12         As of Today 07/24/12 06/13/12 06/13/12 05/30/12     Blood Pressure    . Blood Pressure < 140/90  140/90 153/87 170/89 177/85 165/98     Result Component    . HEMOGLOBIN A1C < 7.0  9.4    13.2    . LDL CALC < 100      111

## 2012-12-25 NOTE — Assessment & Plan Note (Signed)
Assessment: Patient's physical exam is most consistent with this diagnosis. She has not been using any medications at present. There is no indication per history or physical exam of joint infection.  Plan:      Will attempt to when necessary NSAIDs.   If persistent issue, may consider joint steroid injection.

## 2012-12-25 NOTE — Assessment & Plan Note (Signed)
Pertinent Labs: Liver Function Tests:    Component Value Date/Time   AST 14 05/30/2012 1558   ALT 19 05/30/2012 1558   ALKPHOS 123* 05/30/2012 1558   BILITOT 0.3 05/30/2012 1558   PROT 7.3 05/30/2012 1558   ALBUMIN 4.3 05/30/2012 1558    Lipid Panel:     Component Value Date/Time   CHOL 191 05/30/2012 1558   TRIG 142 05/30/2012 1558   HDL 52 05/30/2012 1558   CHOLHDL 3.7 05/30/2012 1558   VLDL 28 05/30/2012 1558   LDLCALC 111* 05/30/2012 1558     Assessment: Goal LDL (per ATP guidelines): < 100 mg/dL  Disease Control: not controlled  Progress toward goals: unable to assess  Barriers to meeting goals: Medication cost.    The patient indicated that pravastatin is no longer on that $4 formulary.  I called [facility], who confirmed that pravastatin is no longer on that $4 formulary.  I will therefore change her pravastatin to lovastatin, which is covered.   Seems that she was just started on the Pravastatin 20mg  daily after this 05/2012 lipid panel listed above - therefore, will not escalate the dosage as of yet.  Patient is compliant most of the time with prescribed medications.    Plan:  Stop pravastatin.   Start lovastatin 20 mg daily   Repeat lipid panel in 6 weeks, with consideration of escalation of therapy as indicated.   I will ask triage to call patient to inform her of this medication change, as I only discovered this issue after her visit

## 2012-12-25 NOTE — Assessment & Plan Note (Addendum)
Pertinent Data: BP Readings from Last 3 Encounters:  12/25/12 140/90  07/24/12 153/87  06/13/12 170/89    Basic Metabolic Panel:    Component Value Date/Time   NA 137 05/30/2012 1558   K 4.4 05/30/2012 1558   CL 101 05/30/2012 1558   CO2 28 05/30/2012 1558   BUN 12 05/30/2012 1558   CREATININE 0.90 05/30/2012 1558   CREATININE 0.8 09/15/2011 1339   GLUCOSE 350* 05/30/2012 1558   CALCIUM 9.7 05/30/2012 1558    Assessment: Disease Control:  Controlled  Progress toward goals:  Improved  Barriers to meeting goals: no barriers identified    Patient is compliant most of the time with prescribed medications.   Plan:  continue current medications  However, at next followup with her PCP, may consider to adjust her regimen. Specifically, the patient is currently on 2 nodal blocking agents including her propranolol and diltiazem. May consider alternative regimen such as addition of hydrochlorothiazide or amlodipine to substitute for the diltiazem  Educational resources provided:    Self management tools provided:  handout

## 2012-12-25 NOTE — Assessment & Plan Note (Addendum)
Pertinent Labs: Lab Results  Component Value Date   HGBA1C 9.4 12/25/2012   HGBA1C 8.5 Repeated and verified X2.* 08/24/2011   CREATININE 0.90 05/30/2012   CREATININE 0.8 09/15/2011   MICROALBUR 2.9* 01/15/2011   MICRALBCREAT 2.1 01/15/2011   CHOL 191 05/30/2012   HDL 52 05/30/2012   TRIG 142 05/30/2012    Assessment: Disease Control:  Uncontrolled  Progress toward goals:   Improved  Barriers to meeting goals: no barriers identified    Patient is compliant most of the time with prescribed medications.   Plan: Glucometer log was reviewed today, as pt did have glucometer available for review.   Continue NPH at 20 units in the morning and increase to 22 units in the evening.  reminded to bring blood glucose meter & log to each visit  Educational resources provided:    Self management tools provided:    Home glucose monitoring recommendation:    Two times daily before breakfast and at bedtime

## 2012-12-25 NOTE — Progress Notes (Signed)
Patient: Sheila Ferguson   MRN: [id number]  DOB: May 24, 1961  PCP: Mathis Dad, MD   Subjective:    HPI: [name] is a 52 y.o. female with a PMHx as outlined below, who presented to clinic today for the following:  1) DM2, uncontrolled -  Lab Results  Component Value Date   HGBA1C 9.4 12/25/2012   Patient checking blood sugars 1- 2 times daily, before breakfast and in the evening prior to dinner. Reports fasting blood sugars of 150-160s mg/dL. Currently taking NPH 20 units BID, Metformin 500mg  BID and Glipizide 10mg . States she was supposed to be taken off of the glipizide, in 06/2012 because of relative hypoglycemia - although essentially she was getting symptomatic with 90 mg/dL. Patient misses doses 2 x per month on average.  0 hypoglycemic episodes since last visit. Denies assisted hypoglycemia or recently hospitalizations for either hyper or hypoglycemia. denies polyuria, polydipsia, nausea, vomiting, diarrhea.  does request refills today - Metformin.  2) HTN - Patient does not check blood pressure regularly at home. Currently taking Lisinopril 40mg  daily and Diltiazem 120mg  BID and propranolol 80mg  BID . Patient misses doses 0 x per week on average. denies headaches, dizziness, lightheadedness, chest pain, shortness of breath.  does not request refills today.   3) Thumb pain - patient indicates 3 week history of left thumb pain. Pain mostly at the proximal end of the thumb. She indicates pain with flexion and extension of the thumb. Occasionally it locks. No direct trauma, however she does think possibly she slept on it cleared. Denies joint swelling, redness, warmth. No fevers, chills.   Review of Systems: Per HPI.    Current Outpatient Medications: Medication Sig  . citalopram (CELEXA) 40 MG tablet Take 1 tablet (40 mg total) by mouth daily.  Marland Kitchen diltiazem (CARDIZEM) 120 MG tablet Take 1 tablet (120 mg total) by mouth 2 (two) times daily.  Marland Kitchen glipiZIDE  (GLUCOTROL) 10 MG tablet Take 10 mg by mouth 1 (one) time daily before a meal.  . ibuprofen (ADVIL,MOTRIN) 800 MG tablet Take 1 tablet (800 mg total) by mouth every 8 (eight) hours as needed for pain.  Marland Kitchen insulin NPH (HUMULIN N,NOVOLIN N) 100 UNIT/ML injection Inject 20 Units into the skin 2 (two) times daily.  Marland Kitchen lisinopril (PRINIVIL,ZESTRIL) 20 MG tablet Take 40 mg by mouth daily.  Marland Kitchen loratadine (CLARITIN) 10 MG tablet Take 1 tablet (10 mg total) by mouth daily as needed for allergies.  . metFORMIN (GLUCOPHAGE) 500 MG tablet Take 500 mg by mouth 2 (two) times daily with a meal.  . pravastatin (PRAVACHOL) 20 MG tablet Take 1 tablet (20 mg total) by mouth every evening.  . propranolol (INDERAL) 80 MG tablet TAKE ONE TABLET BY MOUTH TWICE DAILY  . aspirin EC 325 MG tablet Take 325 mg by mouth every morning.  . metFORMIN (GLUCOPHAGE) 500 MG tablet Take 1 tablet (500 mg total) by mouth 2 (two) times daily with a meal.    Allergies: Allergies  Allergen Reactions  . Azithromycin     REACTION: bump on face  . Metformin     REACTION: rectal tears as per patient    Past Medical History  Diagnosis Date  . Diabetes mellitus type II   . Hypertension   . Sleep apnea   . Congenital stricture of esophagus   . Dysmetabolic syndrome   . Headache   . Sarcoid     Objective:    Physical Exam: Filed Vitals:  12/25/12 0922  BP: 140/90  Pulse: 67  Temp: 97.5 F (36.4 C)     General: Vital signs reviewed and noted. Well-developed, well-nourished, in no acute distress; alert, appropriate and cooperative throughout examination.  Head: Normocephalic, atraumatic.  Lungs:  Normal respiratory effort. Clear to auscultation BL without crackles or wheezes.  Heart: RRR. S1 and S2 normal without gallop, rubs. No murmur.  Abdomen:  BS normoactive. Soft, Nondistended, non-tender.  No masses or organomegaly.  Extremities: No pretibial edema. Left hand - positive Finklestein's test. Mild TTP over MCP joint.  No warmth, redness, swelling. Full ROM.   Assessment/ Plan:   The patient's case and plan of care was discussed with attending physician, Dr. Doneen Poisson.

## 2012-12-26 NOTE — Progress Notes (Signed)
Case discussed with Dr. Kalia-Reynolds soon after the resident saw the patient.  We reviewed the resident's history and exam and pertinent patient test results.  I agree with the assessment, diagnosis and plan of care documented in the resident's note. 

## 2013-01-16 ENCOUNTER — Ambulatory Visit (INDEPENDENT_AMBULATORY_CARE_PROVIDER_SITE_OTHER): Payer: Self-pay | Admitting: Internal Medicine

## 2013-01-16 ENCOUNTER — Encounter: Payer: Self-pay | Admitting: Internal Medicine

## 2013-01-16 VITALS — BP 134/82 | HR 73 | Temp 98.2°F | Ht 64.0 in | Wt 309.4 lb

## 2013-01-16 DIAGNOSIS — I10 Essential (primary) hypertension: Secondary | ICD-10-CM

## 2013-01-16 DIAGNOSIS — IMO0001 Reserved for inherently not codable concepts without codable children: Secondary | ICD-10-CM

## 2013-01-16 DIAGNOSIS — M654 Radial styloid tenosynovitis [de Quervain]: Secondary | ICD-10-CM

## 2013-01-16 MED ORDER — LORATADINE 10 MG PO TABS: 10.0000 mg | ORAL_TABLET | Freq: Every day | ORAL | Status: DC | PRN

## 2013-01-16 MED ORDER — INSULIN NPH (HUMAN) (ISOPHANE) 100 UNIT/ML ~~LOC~~ SUSP: SUBCUTANEOUS | Status: DC

## 2013-01-16 MED ORDER — TRIAMTERENE-HCTZ 37.5-25 MG PO TABS: 1.0000 | ORAL_TABLET | Freq: Every day | ORAL | Status: DC

## 2013-01-16 MED ORDER — ASPIRIN EC 81 MG PO TBEC: 81.0000 mg | DELAYED_RELEASE_TABLET | Freq: Every day | ORAL | Status: AC

## 2013-01-16 MED ORDER — PROPRANOLOL HCL 80 MG PO TABS: ORAL_TABLET | ORAL | Status: DC

## 2013-01-16 NOTE — Addendum Note (Signed)
Addended by: Carrolyn Meiers T on: 01/16/2013 02:43 PM   Modules accepted: Orders

## 2013-01-16 NOTE — Assessment & Plan Note (Signed)
Not control with NSAID. Patient would like to have a joint injection. Will refer her to sport medicine. Discussed with attending Dr. Dalphine Handing

## 2013-01-16 NOTE — Progress Notes (Signed)
Patient ID: Sheila Ferguson, female   DOB: Apr 07, 1961, 52 y.o.   MRN: [id number] HPI: [name] is a 52 year old woman who is well-known to me presents today for routine followup. She was recently seen in April by Dr. Thad Ranger for left thumb pain which is not resolved with NSAID. She states that now it has been going on for one month and that her thumb continues to lock and would like to have a joint injection. During last office visit, her statin was changed to lovastatin. She states that she is tolerating it okay. Her blood sugar has been fluctuating between 150s to 300's.  She is not eating regularly and that sometimes she skips female but denies any episode of hypoglycemia. She has started to exercise trying to lose some weight. She does have some leg swelling. She states that she was on diuretics in the past however was unable to tolerate do to hypokalemia. She also states that Celexa is helping with her depression. She is in the process of applying for orange card therefore is unable to schedule for colonoscopy or any other health maintenance except for mammogram.   ROS: as per HPI  PE:  General: alert, well-developed, and cooperative to examination. its.  Lungs: normal respiratory effort, no accessory muscle use, normal breath sounds, no crackles, and no wheezes. Heart: normal rate, regular rhythm, no murmur, no gallop, and no rub.  Abdomen: Obese soft, non-tender, normal bowel sounds, no distention, no guarding, no rebound tenderness Msk: no joint swelling, no joint warmth, and no redness over joints.  Pulses: 2+ DP/PT pulses bilaterally Extremities: No cyanosis, clubbing. Left thumb: Pain with extension, mild swelling of the left thumb compared to the right. No erythema or drainage noted. +finkelstein test Neurologic:nonfocal Skin: turgor normal and no rashes.  Psych: appropriate

## 2013-01-16 NOTE — Patient Instructions (Addendum)
Increase NPH insulin to 22 units in AM and 23 units in PM.   Stop Diltiazem Start taking hydrochlorothiazide-triamterene one tablet daily for blood pressure control Will refer to sport medicine for left thumb injection Return to clinic in 1 week for lab work Return to clinic in 1 month for blood pressure recheck

## 2013-01-16 NOTE — Addendum Note (Signed)
Addended by: Maura Crandall on: 01/16/2013 04:30 PM   Modules accepted: Orders

## 2013-01-16 NOTE — Assessment & Plan Note (Signed)
Poorly controlled. Last hemoglobin A1c was 9.4 in 12/25/2012. I review her blood sugar log sheet and that her blood sugar has been fluctuating between 153-354 throughout the day as was fasting blood glucose. -I will increase her morning dose of NPH to 22 units & 23 units in p.m. Patient was instructed to notify our office if she becomes hypoglycemic. -Continue metformin 500 mg by mouth twice a day and Glucotrol 10 mg daily -Will repeat A1c in 3 months -Will check urine microalbumin creatinine ratio today -Will need ophthalmology exam .

## 2013-01-16 NOTE — Assessment & Plan Note (Addendum)
  BP Readings from Last 3 Encounters:  01/16/13 134/82  12/25/12 140/90  07/24/12 153/87    Lab Results  Component Value Date   NA 137 05/30/2012   K 4.4 05/30/2012   CREATININE 0.90 05/30/2012    Assessment Well-controlled however patient is currently on both propanolol and Diltiazem which are both nodal blockers.   -Will stop Diltiazem -continue propranolol (this is $4) -Start hydrochlorothiazide-triamterene 25/37.5mg  qd  -continue Lisinopril 40mg  qd -Repeat BMP in 1 week -return to clinic in 1 month for repeat BP

## 2013-01-17 LAB — MICROALBUMIN / CREATININE URINE RATIO: Microalb, Ur: 2.52 mg/dL — ABNORMAL HIGH (ref 0.00–1.89)

## 2013-01-17 NOTE — Progress Notes (Signed)
I discussed this case with Dr. Ho soon after she saw the patient. I have read her documentation and I agree with her plan of care. Please see the resident note for details of management. 

## 2013-01-22 ENCOUNTER — Ambulatory Visit: Payer: Self-pay

## 2013-01-22 ENCOUNTER — Telehealth: Payer: Self-pay | Admitting: *Deleted

## 2013-01-22 NOTE — Telephone Encounter (Signed)
Pharmacy is requesting refill on Diltiazem - last refill 11/24/12 and written 11/24/12.

## 2013-01-23 ENCOUNTER — Other Ambulatory Visit (INDEPENDENT_AMBULATORY_CARE_PROVIDER_SITE_OTHER): Payer: No Typology Code available for payment source

## 2013-01-23 DIAGNOSIS — I10 Essential (primary) hypertension: Secondary | ICD-10-CM

## 2013-01-23 LAB — BASIC METABOLIC PANEL WITH GFR
CO2: 23 mEq/L (ref 19–32)
Chloride: 102 mEq/L (ref 96–112)
GFR, Est African American: 55 mL/min — ABNORMAL LOW
Glucose, Bld: 186 mg/dL — ABNORMAL HIGH (ref 70–99)
Potassium: 4.4 mEq/L (ref 3.5–5.3)
Sodium: 137 mEq/L (ref 135–145)

## 2013-01-23 NOTE — Telephone Encounter (Signed)
Yes, stop Diltiazem and continue metoprolol

## 2013-01-26 ENCOUNTER — Ambulatory Visit (INDEPENDENT_AMBULATORY_CARE_PROVIDER_SITE_OTHER): Payer: No Typology Code available for payment source | Admitting: Family Medicine

## 2013-01-26 VITALS — BP 132/88 | Ht 64.0 in | Wt 308.0 lb

## 2013-01-26 DIAGNOSIS — M6530 Trigger finger, unspecified finger: Secondary | ICD-10-CM

## 2013-01-26 DIAGNOSIS — M65312 Trigger thumb, left thumb: Secondary | ICD-10-CM

## 2013-01-26 MED ORDER — METHYLPREDNISOLONE ACETATE 20 MG/ML IJ SUSP: 10.0000 mg | Freq: Once | INTRAMUSCULAR | Status: DC

## 2013-01-26 NOTE — Addendum Note (Signed)
Addended by: Jacki Cones C on: 01/26/2013 02:14 PM   Modules accepted: Orders

## 2013-01-26 NOTE — Assessment & Plan Note (Signed)
Left trigger thumb Injected today Followup in one month Consider surgical decompression if not improved

## 2013-01-26 NOTE — Progress Notes (Signed)
Sheila Ferguson is a 52 y.o. female who presents to [facility] today for left thumb pain present for one month. Patient notes pain in her left thumb MCP with inability to flex her IP joint. She denies any radiating pain weakness or numbness. She denies any injury. She notes that occasionally triggers.  She is not currently working  PMH reviewed. Uncontrolled diabetes History  Substance Use Topics  . Smoking status: Never Smoker   . Smokeless tobacco: Not on file  . Alcohol Use: No   ROS as above otherwise neg   Exam:  BP 132/88  Ht 5\' 4"  (1.626 m)  Wt 308 lb (139.708 kg)  BMI 52.84 kg/m2 Gen: Well NAD MSK: Left hand. Normal-appearing Tender to palpation left thumb MCP Difficulty flexing IP Capillary pulses and sensation intact distally Hand motion otherwise normal  Limited musculoskeletal ultrasound of the left thumb. Flexor tendon sheath visualized with surrounding hypoechoic fluid consistent with tenosynovitis. Thickened A1 pulley band present.   Left trigger thumb injection Consent obtained and timeout performed Flexor tendon sheath visualized on ultrasound Skin and probe sterilized Sterile gel used Tendon sheath again visualized Cold spray applied 30-gauge 1 inch needle used to access the tendon sheath under ultrasound guidance 5 mg of triamcinolone and 0.5 mL of lidocaine were injected into the tendon sheath Patient tolerated the procedure well

## 2013-01-26 NOTE — Patient Instructions (Addendum)
Thank you for coming in today. Call or go to the ER if you develop a large red swollen joint with extreme pain or oozing puss.  In 4 weeks if not better please call us and we will get you an appointment with the hand surgeon.

## 2013-02-01 NOTE — Telephone Encounter (Signed)
Called pharmacy and informed them pt should be off Diltiazem per Dr Anselm Jungling.

## 2013-02-08 ENCOUNTER — Ambulatory Visit (INDEPENDENT_AMBULATORY_CARE_PROVIDER_SITE_OTHER): Payer: No Typology Code available for payment source | Admitting: Internal Medicine

## 2013-02-08 ENCOUNTER — Encounter: Payer: Self-pay | Admitting: Internal Medicine

## 2013-02-08 VITALS — BP 125/84 | HR 84 | Temp 96.9°F | Ht 64.0 in | Wt 301.1 lb

## 2013-02-08 DIAGNOSIS — I10 Essential (primary) hypertension: Secondary | ICD-10-CM

## 2013-02-08 DIAGNOSIS — R42 Dizziness and giddiness: Secondary | ICD-10-CM | POA: Insufficient documentation

## 2013-02-08 DIAGNOSIS — IMO0001 Reserved for inherently not codable concepts without codable children: Secondary | ICD-10-CM

## 2013-02-08 NOTE — Progress Notes (Signed)
Patient ID: Sheila Ferguson, female   DOB: Jun 11, 1961, 52 y.o.   MRN: [id number]  Subjective:   Patient ID: [name] female   DOB: 11/03/1960 52 y.o.   MRN: [id number]  HPI: Ms.[name] is a 52 y.o. F with PMH DM2, HTN, HLD, anxiety/depression who presents to the clinic with complaints of dizziness.  She states that for the past week she has been experiencing dizziness where the room is spinning primarily when she leans forward or turns her head. She is also having nausea when this occurs but it has not affected her ability to eat.   She was started on Triamterene/HCTZ on 01/16/13, and was asymptomatic prior to beginning this medication.    Past Medical History  Diagnosis Date  . Diabetes mellitus type II   . Hypertension   . Sleep apnea   . Congenital stricture of esophagus   . Dysmetabolic syndrome   . Headache(784.0)   . Sarcoid    Current Outpatient Prescriptions  Medication Sig Dispense Refill  . aspirin EC 81 MG tablet Take 1 tablet (81 mg total) by mouth daily.  90 tablet  2  . citalopram (CELEXA) 40 MG tablet Take 1 tablet (40 mg total) by mouth daily.  90 tablet  2  . glipiZIDE (GLUCOTROL) 10 MG tablet Take 1 tablet (10 mg total) by mouth daily.  30 tablet  1  . insulin NPH (HUMULIN N,NOVOLIN N) 100 UNIT/ML injection 22 units in AM and 23 units in PM  1 vial  3  . lisinopril (PRINIVIL,ZESTRIL) 20 MG tablet Take 40 mg by mouth daily.      Marland Kitchen loratadine (CLARITIN) 10 MG tablet Take 1 tablet (10 mg total) by mouth daily as needed for allergies.  90 tablet  2  . lovastatin (MEVACOR) 20 MG tablet Take 1 tablet (20 mg total) by mouth daily.  30 tablet  1  . metFORMIN (GLUCOPHAGE) 500 MG tablet Take 1 tablet (500 mg total) by mouth 2 (two) times daily with a meal.  60 tablet  1  . propranolol (INDERAL) 80 MG tablet TAKE ONE TABLET BY MOUTH TWICE DAILY  60 tablet  11   No current facility-administered medications for this visit.   Facility-Administered  Medications Ordered in Other Visits  Medication Dose Route Frequency Provider Last Rate Last Dose  . methylPREDNISolone acetate (DEPO-MEDROL) 20 MG/ML injection 10 mg  10 mg Intra-articular Once Nestor Ramp, MD       Family History  Problem Relation Age of Onset  . Sarcoidosis Father   . Leukemia Father   . Hypertension Mother   . Diabetes Mother   . Heart attack Mother   . Aneurysm Mother     CNS  . Hypertension Sister   . Diabetes Sister   . Sleep apnea Sister    History   Social History  . Marital Status: Single    Spouse Name: N/A    Number of Children: N/A  . Years of Education: 13+   Occupational History  . 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 1 year left   Social History Main Topics  . Smoking status: Never Smoker   . Smokeless tobacco: None  . Alcohol Use: No  . Drug Use: No  . Sexually Active: None   Other Topics Concern  . None   Social History Narrative  . None   A 10 point ROS was performed; pertinent positives and negatives were noted in the  HPI  Objective:  Physical Exam: Filed Vitals:   02/08/13 1345 02/08/13 1415 02/08/13 1416 02/08/13 1417  BP: 129/85 124/80 123/76 125/84  Pulse: 84     Temp: 96.9 F (36.1 C)     TempSrc: Oral     Height: 5\' 4"  (1.626 m)     Weight: 301 lb 1.6 oz (136.578 kg)     SpO2: 97%      Constitutional: Vital signs reviewed.  Patient is a well-developed and well-nourished female in no acute distress and cooperative with exam. Alert.  Head: Normocephalic and atraumatic Mouth: No erythema or exudates, MMM Eyes: PERRL, EOMI, conjunctivae normal, No scleral icterus.  Neck: Supple, Trachea midline normal ROM, No JVD, mass, thyromegaly Cardiovascular: RRR, S1 normal, S2 normal, no MRG, pulses symmetric and intact bilaterally Pulmonary/Chest: Normal respiratory effort, CTAB, no wheezes, rales, or rhonchi Abdominal: Soft. Non-tender, non-distended, no masses, organomegaly, or guarding present.  GU: no CVA  tenderness Musculoskeletal: No joint deformities, erythema, or stiffness, ROM full and no nontender Hematology: No cervical adenopathy.  Neurological: A&O x3, Strength is normal and symmetric bilaterally, cranial nerve II-XII are grossly intact, no focal motor deficit, sensory intact bilaterally. Dix-Hallpike negative, and no dizziness performing maneuver. Skin: Warm, dry and intact. No rash, cyanosis, or clubbing.  Psychiatric: Normal mood and affect. speech and behavior is normal. Judgment and thought content, Cognition and memory appear normal.   Assessment & Plan:   Please refer to Problem List based Assessment and Plan

## 2013-02-08 NOTE — Assessment & Plan Note (Addendum)
Lab Results  Component Value Date   HGBA1C 9.4 12/25/2012   HGBA1C 13.2 05/30/2012   HGBA1C 10.4 11/26/2011     Assessment: Diabetes control: poor control (HgbA1C >9%) Progress toward A1C goal:  improved Comments:   Plan: Medications:  continue current medications Home glucose monitoring: Frequency: 6 times a day Timing: before meals;at bedtime;after breakfast;after lunch;after dinner Instruction/counseling given: reminded to bring blood glucose meter & log to each visit and discussed the need for weight loss Educational resources provided: brochure;handout Self management tools provided: copy of home glucose meter download Other plans: Discussed diet and nutrition.     CBGs remain elevated, but the pt is not taking her Metformin regularly and occasionally reduces the amount of insulin she takes. She states she has hypoglycemic symptoms if her blood sugar is in the 120s. CBGs averaging b/w 123-245. She also states that her diet is not very good and she is eating 1 lg meal and drinking soda. We discussed ways to try to improve her diet, and I am providing information for her. She would likely benefit from meeting with Lupita Leash at some point in the next few months. She is to take her insulin and Metformin as prescribed.

## 2013-02-08 NOTE — Patient Instructions (Signed)
**Stop taking the Maxide (Triamtrene/HCTZ), and let's see how your blood pressure does. If it becomes elevated, we will add a different medication.   **If the dizziness continues after 1 week off the medication, please call the clinic for an appointment.  Diabetes Meal Planning Guide The diabetes meal planning guide is a tool to help you plan your meals and snacks. It is important for people with diabetes to manage their blood glucose (sugar) levels. Choosing the right foods and the right amounts throughout your day will help control your blood glucose. Eating right can even help you improve your blood pressure and reach or maintain a healthy weight. CARBOHYDRATE COUNTING MADE EASY When you eat carbohydrates, they turn to sugar. This raises your blood glucose level. Counting carbohydrates can help you control this level so you feel better. When you plan your meals by counting carbohydrates, you can have more flexibility in what you eat and balance your medicine with your food intake. Carbohydrate counting simply means adding up the total amount of carbohydrate grams in your meals and snacks. Try to eat about the same amount at each meal. Foods with carbohydrates are listed below. Each portion below is 1 carbohydrate serving or 15 grams of carbohydrates. Ask your dietician how many grams of carbohydrates you should eat at each meal or snack. Grains and Starches  1 slice bread.   English muffin or hotdog/hamburger bun.   cup cold cereal (unsweetened).   cup cooked pasta or rice.   cup starchy vegetables (corn, potatoes, peas, beans, winter squash).  1 tortilla (6 inches).   bagel.  1 waffle or pancake (size of a CD).   cup cooked cereal.  4 to 6 small crackers. *Whole grain is recommended. Fruit  1 cup fresh unsweetened berries, melon, papaya, pineapple.  1 small fresh fruit.   banana or mango.   cup fruit juice (4 oz unsweetened).   cup canned fruit in natural juice or  water.  2 tbs dried fruit.  12 to 15 grapes or cherries. Milk and Yogurt  1 cup fat-free or 1% milk.  1 cup soy milk.  6 oz light yogurt with sugar-free sweetener.  6 oz low-fat soy yogurt.  6 oz plain yogurt. Vegetables  1 cup raw or  cup cooked is counted as 0 carbohydrates or a "free" food.  If you eat 3 or more servings at 1 meal, count them as 1 carbohydrate serving. Other Carbohydrates   oz chips or pretzels.   cup ice cream or frozen yogurt.   cup sherbet or sorbet.  2 inch square cake, no frosting.  1 tbs honey, sugar, jam, jelly, or syrup.  2 small cookies.  3 squares of graham crackers.  3 cups popcorn.  6 crackers.  1 cup broth-based soup.  Count 1 cup casserole or other mixed foods as 2 carbohydrate servings.  Foods with less than 20 calories in a serving may be counted as 0 carbohydrates or a "free" food. You may want to purchase a book or computer software that lists the carbohydrate gram counts of different foods. In addition, the nutrition facts panel on the labels of the foods you eat are a good source of this information. The label will tell you how big the serving size is and the total number of carbohydrate grams you will be eating per serving. Divide this number by 15 to obtain the number of carbohydrate servings in a portion. Remember, 1 carbohydrate serving equals 15 grams of carbohydrate. SERVING SIZES  Measuring foods and serving sizes helps you make sure you are getting the right amount of food. The list below tells how big or small some common serving sizes are.  1 oz.........4 stacked dice.  3 oz........Marland KitchenDeck of cards.  1 tsp.......Marland KitchenTip of little finger.  1 tbs......Marland KitchenMarland KitchenThumb.  2 tbs.......Marland KitchenGolf ball.   cup......Marland KitchenHalf of a fist.  1 cup.......Marland KitchenA fist. SAMPLE DIABETES MEAL PLAN Below is a sample meal plan that includes foods from the grain and starches, dairy, vegetable, fruit, and meat groups. A dietician can individualize a  meal plan to fit your calorie needs and tell you the number of servings needed from each food group. However, controlling the total amount of carbohydrates in your meal or snack is more important than making sure you include all of the food groups at every meal. You may interchange carbohydrate containing foods (dairy, starches, and fruits). The meal plan below is an example of a 2000 calorie diet using carbohydrate counting. This meal plan has 17 carbohydrate servings. Breakfast  1 cup oatmeal (2 carb servings).   cup light yogurt (1 carb serving).  1 cup blueberries (1 carb serving).   cup almonds. Snack  1 large apple (2 carb servings).  1 low-fat string cheese stick. Lunch  Chicken breast salad.  1 cup spinach.   cup chopped tomatoes.  2 oz chicken breast, sliced.  2 tbs low-fat [place] dressing.  12 whole-wheat crackers (2 carb servings).  12 to 15 grapes (1 carb serving).  1 cup low-fat milk (1 carb serving). Snack  1 cup carrots.   cup hummus (1 carb serving). Dinner  3 oz broiled salmon.  1 cup brown rice (3 carb servings). Snack  1  cups steamed broccoli (1 carb serving) drizzled with 1 tsp olive oil and lemon juice.  1 cup light pudding (2 carb servings). DIABETES MEAL PLANNING WORKSHEET Your dietician can use this worksheet to help you decide how many servings of foods and what types of foods are right for you.  BREAKFAST Food Group and Servings / Carb Servings Grain/Starches __________________________________ Dairy __________________________________________ Vegetable ______________________________________ Fruit ___________________________________________ Meat __________________________________________ Fat ____________________________________________ LUNCH Food Group and Servings / Carb Servings Grain/Starches ___________________________________ Dairy ___________________________________________ Fruit  ____________________________________________ Meat ___________________________________________ Fat _____________________________________________ Laural Golden Food Group and Servings / Carb Servings Grain/Starches ___________________________________ Dairy ___________________________________________ Fruit ____________________________________________ Meat ___________________________________________ Fat _____________________________________________ SNACKS Food Group and Servings / Carb Servings Grain/Starches ___________________________________ Dairy ___________________________________________ Vegetable _______________________________________ Fruit ____________________________________________ Meat ___________________________________________ Fat _____________________________________________ DAILY TOTALS Starches _________________________ Vegetable ________________________ Fruit ____________________________ Dairy ____________________________ Meat ____________________________ Fat ______________________________ Document Released: 05/27/2005 Document Revised: 11/22/2011 Document Reviewed: 04/07/2009 [employer] Patient Information 2014 [employer] Diets for Diabetes, Food Labeling Look at food labels to help you decide how much of a product you can eat. You will want to check the amount of total carbohydrate in a serving to see how the food fits into your meal plan. In the list of ingredients, the ingredient present in the largest amount by weight must be listed first, followed by the other ingredients in descending order. STANDARD OF IDENTITY Most products have a list of ingredients. However, foods that the Food and Drug Administration (FDA) has given a standard of identity do not need a list of ingredients. A standard of identity means that a food must contain certain ingredients if it is called a particular name. Examples are mayonnaise, peanut butter, ketchup, jelly, and cheese. LABELING  TERMS There are many terms found on food labels. Some of these terms have specific definitions. Some terms  are regulated by the FDA, and the FDA has clearly specified how they can be used. Others are not regulated or well-defined and can be misleading and confusing. SPECIFICALLY DEFINED TERMS Nutritive Sweetener.  A sweetener that contains calories,such as table sugar or honey. Nonnutritive Sweetener.  A sweetener with few or no calories,such as saccharin, aspartame, sucralose, and cyclamate. LABELING TERMS REGULATED BY THE FDA Free.  The product contains only a tiny or small amount of fat, cholesterol, sodium, sugar, or calories. For example, a "fat-free" product will contain less than 0.5 g of fat per serving. Low.  A food described as "low" in fat, saturated fat, cholesterol, sodium, or calories could be eaten fairly often without exceeding dietary guidelines. For example, "low in fat" means no more than 3 g of fat per serving. Lean.  "Lean" and "extra lean" are U.S. Department of Agriculture Architect) terms for use on meat and poultry products. "Lean" means the product contains less than 10 g of fat, 4 g of saturated fat, and 95 mg of cholesterol per serving. "Lean" is not as low in fat as a product labeled "low." Extra Lean.  "Extra lean" means the product contains less than 5 g of fat, 2 g of saturated fat, and 95 mg of cholesterol per serving. While "extra lean" has less fat than "lean," it is still higher in fat than a product labeled "low." Reduced, Less, Fewer.  A diet product that contains 25% less of a nutrient or calories than the regular version. For example, hot dogs might be labeled "25% less fat than our regular hot dogs." Light/Lite.  A diet product that contains  fewer calories or  the fat of the original. For example, "light in sodium" means a product with  the usual sodium. More.  One serving contains at least 10% more of the daily value of a vitamin, mineral, or  fiber than usual. Good Source Of.  One serving contains 10% to 19% of the daily value for a particular vitamin, mineral, or fiber. Excellent Source Of.  One serving contains 20% or more of the daily value for a particular nutrient. Other terms used might be "high in" or "rich in." Enriched or Fortified.  The product contains added vitamins, minerals, or protein. Nutrition labeling must be used on enriched or fortified foods. Imitation.  The product has been altered so that it is lower in protein, vitamins, or minerals than the usual food,such as imitation peanut butter. Total Fat.  The number listed is the total of all fat found in a serving of the product. Under total fat, food labels must list saturated fat and trans fat, which are associated with raising bad cholesterol and an increased risk of heart blood vessel disease. Saturated Fat.  Mainly fats from animal-based sources. Some examples are red meat, cheese, cream, whole milk, and coconut oil. Trans Fat.  Found in some fried snack foods, packaged foods, and fried restaurant foods. It is recommended you eat as close to 0 g of trans fat as possible, since it raises bad cholesterol and lowers good cholesterol. Polyunsaturated and Monounsaturated Fats.  More healthful fats. These fats are from plant sources. Total Carbohydrate.  The number of carbohydrate grams in a serving of the product. Under total carbohydrate are listed the other carbohydrate sources, such as dietary fiber and sugars. Dietary Fiber.  A carbohydrate from plant sources. Sugars.  Sugars listed on the label contain all naturally occurring sugars as well as added sugars. LABELING TERMS NOT REGULATED  BY THE FDA Sugarless.  Table sugar (sucrose) has not been added. However, the manufacturer may use another form of sugar in place of sucrose to sweeten the product. For example, sugar alcohols are used to sweeten foods. Sugar alcohols are a form of sugar but are not  table sugar. If a product contains sugar alcohols in place of sucrose, it can still be labeled "sugarless." Low Salt, Salt-Free, Unsalted, No Salt, No Salt Added, Without Added Salt.  Food that is usually processed with salt has been made without salt. However, the food may contain sodium-containing additives, such as preservatives, leavening agents, or flavorings. Natural.  This term has no legal meaning. Organic.  Foods that are certified as organic have been inspected and approved by the USDA to ensure they are produced without pesticides, fertilizers containing synthetic ingredients, bioengineering, or ionizing radiation. Document Released: 09/02/2003 Document Revised: 11/22/2011 Document Reviewed: 03/20/2009 [facility] Patient Information 2014 [place], [place].

## 2013-02-08 NOTE — Assessment & Plan Note (Signed)
Likely orthostatic from the [facility], as she has only been taking the medication for 3 weeks. However, she was not orthostatic in the clinic today. Gilberto Better was negative, so I don't think this is BPPV. Stopping the Maxide and will reevaluate in 3 weeks. She is to call the clinic if her sx are not better in 1 week after stopping the Maxide.

## 2013-02-09 NOTE — Progress Notes (Signed)
Case discussed with Dr. Kathryn Glenn at the time of the visit, immediately after the resident saw the patient.  I reviewed the resident's history and exam and pertinent patient test results.  I agree with the assessment, diagnosis and plan of care documented in the resident's note.     

## 2013-02-12 NOTE — Assessment & Plan Note (Addendum)
BP Readings from Last 3 Encounters:  02/08/13 125/84  01/26/13 132/88  01/16/13 134/82    Lab Results  Component Value Date   NA 137 01/23/2013   K 4.4 01/23/2013   CREATININE 1.30* 01/23/2013    Assessment: Blood pressure control: controlled Progress toward BP goal:  at goal Comments:   Plan: Medications:  See below for plan Educational resources provided: brochure;handout;video Self management tools provided:   Other plans:   She was started on Maxide on 5/6 at her last clinic visit. Her BP is well controlled; unfortunately she has been having dizziness associated with bending over and turning her head. Dix-Hallpike is negative, so BPPV is less likely. Given the correlation with starting the Maxide and onset of dizziness and nausea 1-2 weeks later, her symptoms are likely related to the medicaiton.  - Stopping Maxide  - Revaluation for dizziness and blood pressure in 1 week.

## 2013-02-16 ENCOUNTER — Ambulatory Visit: Payer: Self-pay | Admitting: Internal Medicine

## 2013-02-20 ENCOUNTER — Encounter: Payer: Self-pay | Admitting: Dietician

## 2013-03-01 ENCOUNTER — Ambulatory Visit (INDEPENDENT_AMBULATORY_CARE_PROVIDER_SITE_OTHER): Payer: No Typology Code available for payment source | Admitting: Internal Medicine

## 2013-03-01 ENCOUNTER — Encounter: Payer: Self-pay | Admitting: Internal Medicine

## 2013-03-01 VITALS — BP 192/88 | HR 77 | Temp 97.9°F | Resp 18 | Ht 63.0 in | Wt 315.1 lb

## 2013-03-01 DIAGNOSIS — I10 Essential (primary) hypertension: Secondary | ICD-10-CM

## 2013-03-01 DIAGNOSIS — IMO0001 Reserved for inherently not codable concepts without codable children: Secondary | ICD-10-CM

## 2013-03-01 LAB — HM DIABETES EYE EXAM

## 2013-03-01 MED ORDER — LISINOPRIL 40 MG PO TABS: 40.0000 mg | ORAL_TABLET | Freq: Every day | ORAL | Status: DC

## 2013-03-01 MED ORDER — HYDROCHLOROTHIAZIDE 12.5 MG PO CAPS: 12.5000 mg | ORAL_CAPSULE | Freq: Every day | ORAL | Status: DC

## 2013-03-01 NOTE — Assessment & Plan Note (Signed)
Lab Results  Component Value Date   HGBA1C 9.4 12/25/2012   HGBA1C 13.2 05/30/2012   HGBA1C 10.4 11/26/2011     Assessment: Diabetes control: fair control Progress toward A1C goal:  unchanged Comments: The patient continues to have episodes of hyperglycemia, though also with a low of 67, and symptoms of hypoglycemia about once per week.  Plan: Medications:  continue current medications.  Patient encouraged to check her blood sugar more frequently, so that we can better adjust her insulin.  She is currently only taking insulin NPH, due to inconsistent eating. Home glucose monitoring: Frequency: 2 times a day Timing: before meals Instruction/counseling given: reminded to bring blood glucose meter & log to each visit Educational resources provided: brochure Self management tools provided:   Other plans: Patient to get retinal camera today.

## 2013-03-01 NOTE — Progress Notes (Signed)
HPI The patient is a 52 y.o. female with a history of DM, HTN, HL, presenting for a 23-month recheck.  The patient was seen 5/29, and complained of symptoms thought to represent orthostatic hypotension, thought to be due to Maxzide, which was discontinued.  The patient's BP is significantly elevated today.  The patient notes resolution of symptoms of lightheadedness.  The patient notes swelling in her legs, but no orthopnea or PND (+sleep apnea, not on CPAP, but no PND).  The patient has a history of DM. Her last A1C was 9.4. Her CBG today is 228. She is currently taking glipizide, metformin, and NPH insulin 22 & 23 units. The patient brings her glucometer today, which shows a range from 68 to 340. She admits to occasional medication non-compliance, due to symptoms of hypoglycemia when her blood sugar is <120. The patient was instructed to increase metformin at her last visit, but did not do so out of concern for rectal tear, which apparently she has had in the past due to diarrhea.  ROS: General: no fevers, chills, changes in weight, changes in appetite Skin: no rash HEENT: no blurry vision, hearing changes, sore throat Pulm: no dyspnea, coughing, wheezing CV: no chest pain, palpitations, shortness of breath Abd: no abdominal pain, nausea/vomiting, diarrhea/constipation GU: no dysuria, hematuria, polyuria Ext: no arthralgias, myalgias Neuro: no weakness, numbness, or tingling  Filed Vitals:   03/01/13 1057  BP: 192/88  Pulse: 77  Temp: 97.9 F (36.6 C)  Resp: 18    PEX General: alert, cooperative, and in no apparent distress HEENT: pupils equal round and reactive to light, vision grossly intact, oropharynx clear and non-erythematous  Neck: supple, no lymphadenopathy Lungs: clear to ascultation bilaterally, normal work of respiration, no wheezes, rales, ronchi Heart: regular rate and rhythm, no murmurs, gallops, or rubs Abdomen: soft, non-tender, non-distended, normal bowel  sounds Extremities: bilateral 1+ pitting edema Neurologic: alert & oriented X3, cranial nerves II-XII intact, strength grossly intact, sensation intact to light touch  Current Outpatient Prescriptions on File Prior to Visit  Medication Sig Dispense Refill  . aspirin EC 81 MG tablet Take 1 tablet (81 mg total) by mouth daily.  90 tablet  2  . citalopram (CELEXA) 40 MG tablet Take 1 tablet (40 mg total) by mouth daily.  90 tablet  2  . glipiZIDE (GLUCOTROL) 10 MG tablet Take 1 tablet (10 mg total) by mouth daily.  30 tablet  1  . insulin NPH (HUMULIN N,NOVOLIN N) 100 UNIT/ML injection 22 units in AM and 23 units in PM  1 vial  3  . lisinopril (PRINIVIL,ZESTRIL) 20 MG tablet Take 40 mg by mouth daily.      Marland Kitchen loratadine (CLARITIN) 10 MG tablet Take 1 tablet (10 mg total) by mouth daily as needed for allergies.  90 tablet  2  . lovastatin (MEVACOR) 20 MG tablet Take 1 tablet (20 mg total) by mouth daily.  30 tablet  1  . metFORMIN (GLUCOPHAGE) 500 MG tablet Take 1 tablet (500 mg total) by mouth 2 (two) times daily with a meal.  60 tablet  1  . propranolol (INDERAL) 80 MG tablet TAKE ONE TABLET BY MOUTH TWICE DAILY  60 tablet  11   Current Facility-Administered Medications on File Prior to Visit  Medication Dose Route Frequency Provider Last Rate Last Dose  . methylPREDNISolone acetate (DEPO-MEDROL) 20 MG/ML injection 10 mg  10 mg Intra-articular Once Nestor Ramp, MD        Assessment/Plan

## 2013-03-01 NOTE — Progress Notes (Signed)
Case discussed with Dr. Brown at the time of the visit.  We reviewed the resident's history and exam and pertinent patient test results.  I agree with the assessment, diagnosis, and plan of care documented in the resident's note. 

## 2013-03-01 NOTE — Assessment & Plan Note (Signed)
BP Readings from Last 3 Encounters:  03/01/13 192/88  02/08/13 125/84  01/26/13 132/88    Lab Results  Component Value Date   NA 137 01/23/2013   K 4.4 01/23/2013   CREATININE 1.30* 01/23/2013    Assessment: Blood pressure control: moderately elevated Progress toward BP goal:  deteriorated Comments: The patient's BP has increased after stopping triam/HCTZ.  She also notes bothersome LE edema, without orthopnea or PND, and with no history of heart failure.  Plan: Medications:  We will start HCTZ 12.5 mg (starting slow, due to lightheadedness with prior diuretic medication).  Continue lisinopril 40 and propranolol 80. Educational resources provided:   Self management tools provided: home blood pressure logbook Other plans: Recheck BMET at next visit, due to addition of HCTZ

## 2013-03-01 NOTE — Patient Instructions (Signed)
General Instructions: For your high blood pressure, we are prescribing HCTZ (hydrochlorothiazide), which is also a diuretic (fluid pill).  Take 1 tablet, once per day.  For your diabetes, we recommend getting an eye exam.  We can perform this today, or when you return for your follow-up visit.  Please return for a blood pressure recheck in 2 weeks.   Treatment Goals:  Goals (1 Years of Data) as of 03/01/13         As of Today 02/08/13 02/08/13 02/08/13 02/08/13     Blood Pressure    . Blood Pressure < 140/90  192/88 125/84 123/76 124/80 129/85     Result Component    . HEMOGLOBIN A1C < 7.0          . LDL CALC < 100            Progress Toward Treatment Goals:  Treatment Goal 03/01/2013  Hemoglobin A1C unchanged  Blood pressure deteriorated    Self Care Goals & Plans:  Self Care Goal 03/01/2013  Manage my medications take my medicines as prescribed; bring my medications to every visit; refill my medications on time  Monitor my health keep track of my blood glucose; bring my glucose meter and log to each visit; keep track of my blood pressure; check my feet daily  Eat healthy foods drink diet soda or water instead of juice or soda; eat more vegetables  Be physically active find an activity I enjoy; take a walk every day    Home Blood Glucose Monitoring 03/01/2013  Check my blood sugar 2 times a day  When to check my blood sugar before meals     Care Management & Community Referrals:  Referral 03/01/2013  Referrals made for care management support none needed  Referrals made to community resources -

## 2013-03-06 NOTE — Addendum Note (Signed)
Addended by: Remus Blake on: 03/06/2013 11:51 AM   Modules accepted: Orders

## 2013-03-08 ENCOUNTER — Other Ambulatory Visit: Payer: Self-pay | Admitting: Internal Medicine

## 2013-03-15 ENCOUNTER — Encounter: Payer: Self-pay | Admitting: Internal Medicine

## 2013-03-15 ENCOUNTER — Ambulatory Visit (INDEPENDENT_AMBULATORY_CARE_PROVIDER_SITE_OTHER): Payer: No Typology Code available for payment source | Admitting: Internal Medicine

## 2013-03-15 VITALS — BP 149/83 | HR 84 | Temp 97.8°F | Ht 64.0 in | Wt 308.2 lb

## 2013-03-15 DIAGNOSIS — I10 Essential (primary) hypertension: Secondary | ICD-10-CM

## 2013-03-15 DIAGNOSIS — E785 Hyperlipidemia, unspecified: Secondary | ICD-10-CM

## 2013-03-15 DIAGNOSIS — IMO0001 Reserved for inherently not codable concepts without codable children: Secondary | ICD-10-CM

## 2013-03-15 LAB — POCT GLYCOSYLATED HEMOGLOBIN (HGB A1C): Hemoglobin A1C: 8.4

## 2013-03-15 LAB — BASIC METABOLIC PANEL
BUN: 13 mg/dL (ref 6–23)
Chloride: 101 mEq/L (ref 96–112)
Potassium: 4 mEq/L (ref 3.5–5.3)
Sodium: 137 mEq/L (ref 135–145)

## 2013-03-15 LAB — GLUCOSE, CAPILLARY: Glucose-Capillary: 192 mg/dL — ABNORMAL HIGH (ref 70–99)

## 2013-03-15 NOTE — Assessment & Plan Note (Signed)
Lab Results  Component Value Date   HGBA1C 8.4 03/15/2013   HGBA1C 9.4 12/25/2012   HGBA1C 13.2 05/30/2012     Assessment: Diabetes control: fair control Progress toward A1C goal:  improved Comments: none  Plan: Medications:  continue current medications Home glucose monitoring: Frequency: 3 times a day Timing: before meals Instruction/counseling given: reminded to get eye exam, reminded to bring blood glucose meter & log to each visit and reminded to bring medications to each visit, referral made to Dr. Nile Riggs Educational resources provided: brochure;handout Self management tools provided: copy of home glucose meter download Other plans: educated to check fsbs tid

## 2013-03-15 NOTE — Assessment & Plan Note (Signed)
BP Readings from Last 3 Encounters:  03/15/13 149/83  03/01/13 192/88  02/08/13 125/84    Lab Results  Component Value Date   NA 137 01/23/2013   K 4.4 01/23/2013   CREATININE 1.30* 01/23/2013    Assessment: Blood pressure control: mildly elevated Progress toward BP goal:  improved Comments: none  Plan: Medications:  continue current medications Educational resources provided: brochure;handout;video Self management tools provided:   Other plans: f/u in 2-3 months, BMET today

## 2013-03-15 NOTE — Patient Instructions (Addendum)
General Instructions: Follow up in 2-3 months Dr. Shirlee Latch  Treatment Goals:  Goals (1 Years of Data) as of 03/15/13         As of Today 03/01/13 02/08/13 02/08/13 02/08/13     Blood Pressure    . Blood Pressure < 140/90  149/83 192/88 125/84 123/76 124/80     Result Component    . HEMOGLOBIN A1C < 7.0  8.4        . LDL CALC < 100            Progress Toward Treatment Goals:  Treatment Goal 03/15/2013  Hemoglobin A1C improved  Blood pressure improved    Self Care Goals & Plans:  Self Care Goal 03/15/2013  Manage my medications take my medicines as prescribed; bring my medications to every visit; refill my medications on time; follow the sick day instructions if I am sick  Monitor my health keep track of my blood glucose; bring my glucose meter and log to each visit; check my feet daily; keep track of my blood pressure  Eat healthy foods eat more vegetables; eat fruit for snacks and desserts; eat foods that are low in salt; eat smaller portions; drink diet soda or water instead of juice or soda  Be physically active take a walk every day; find an activity I enjoy  Meeting treatment goals maintain the current self-care plan    Home Blood Glucose Monitoring 03/15/2013  Check my blood sugar 3 times a day  When to check my blood sugar before meals     Care Management & Community Referrals:  Referral 03/15/2013  Referrals made for care management support none needed  Referrals made to community resources none       Diabetes Meal Planning Guide The diabetes meal planning guide is a tool to help you plan your meals and snacks. It is important for people with diabetes to manage their blood glucose (sugar) levels. Choosing the right foods and the right amounts throughout your day will help control your blood glucose. Eating right can even help you improve your blood pressure and reach or maintain a healthy weight. CARBOHYDRATE COUNTING MADE EASY When you eat carbohydrates, they turn to  sugar. This raises your blood glucose level. Counting carbohydrates can help you control this level so you feel better. When you plan your meals by counting carbohydrates, you can have more flexibility in what you eat and balance your medicine with your food intake. Carbohydrate counting simply means adding up the total amount of carbohydrate grams in your meals and snacks. Try to eat about the same amount at each meal. Foods with carbohydrates are listed below. Each portion below is 1 carbohydrate serving or 15 grams of carbohydrates. Ask your dietician how many grams of carbohydrates you should eat at each meal or snack. Grains and Starches  1 slice bread.   English muffin or hotdog/hamburger bun.   cup cold cereal (unsweetened).   cup cooked pasta or rice.   cup starchy vegetables (corn, potatoes, peas, beans, winter squash).  1 tortilla (6 inches).   bagel.  1 waffle or pancake (size of a CD).   cup cooked cereal.  4 to 6 small crackers. *Whole grain is recommended. Fruit  1 cup fresh unsweetened berries, melon, papaya, pineapple.  1 small fresh fruit.   banana or mango.   cup fruit juice (4 oz unsweetened).   cup canned fruit in natural juice or water.  2 tbs dried fruit.  12 to 15  grapes or cherries. Milk and Yogurt  1 cup fat-free or 1% milk.  1 cup soy milk.  6 oz light yogurt with sugar-free sweetener.  6 oz low-fat soy yogurt.  6 oz plain yogurt. Vegetables  1 cup raw or  cup cooked is counted as 0 carbohydrates or a "free" food.  If you eat 3 or more servings at 1 meal, count them as 1 carbohydrate serving. Other Carbohydrates   oz chips or pretzels.   cup ice cream or frozen yogurt.   cup sherbet or sorbet.  2 inch square cake, no frosting.  1 tbs honey, sugar, jam, jelly, or syrup.  2 small cookies.  3 squares of graham crackers.  3 cups popcorn.  6 crackers.  1 cup broth-based soup.  Count 1 cup casserole or other  mixed foods as 2 carbohydrate servings.  Foods with less than 20 calories in a serving may be counted as 0 carbohydrates or a "free" food. You may want to purchase a book or computer software that lists the carbohydrate gram counts of different foods. In addition, the nutrition facts panel on the labels of the foods you eat are a good source of this information. The label will tell you how big the serving size is and the total number of carbohydrate grams you will be eating per serving. Divide this number by 15 to obtain the number of carbohydrate servings in a portion. Remember, 1 carbohydrate serving equals 15 grams of carbohydrate. SERVING SIZES Measuring foods and serving sizes helps you make sure you are getting the right amount of food. The list below tells how big or small some common serving sizes are.  1 oz.........4 stacked dice.  3 oz........Marland KitchenDeck of cards.  1 tsp.......Mar[place] KitchenTip of little finger.  1 tbs......Marland KitchenMarland KitchenThumb.  2 tbs.......Marland KitchenGolf ball.   cup......Marland KitchenHalf of a fist.  1 cup.......Marland KitchenA fist. SAMPLE DIABETES MEAL PLAN Below is a sample meal plan that includes foods from the grain and starches, dairy, vegetable, fruit, and meat groups. A dietician can individualize a meal plan to fit your calorie needs and tell you the number of servings needed from each food group. However, controlling the total amount of carbohydrates in your meal or snack is more important than making sure you include all of the food groups at every meal. You may interchange carbohydrate containing foods (dairy, starches, and fruits). The meal plan below is an example of a 2000 calorie diet using carbohydrate counting. This meal plan has 17 carbohydrate servings. Breakfast  1 cup oatmeal (2 carb servings).   cup light yogurt (1 carb serving).  1 cup blueberries (1 carb serving).   cup almonds. Snack  1 large apple (2 carb servings).  1 low-fat string cheese stick. Lunch  Chicken breast salad.  1 cup  spinach.   cup chopped tomatoes.  2 oz chicken breast, sliced.  2 tbs low-fat [place] dressing.  12 whole-wheat crackers (2 carb servings).  12 to 15 grapes (1 carb serving).  1 cup low-fat milk (1 carb serving). Snack  1 cup carrots.   cup hummus (1 carb serving). Dinner  3 oz broiled salmon.  1 cup brown rice (3 carb servings). Snack  1  cups steamed broccoli (1 carb serving) drizzled with 1 tsp olive oil and lemon juice.  1 cup light pudding (2 carb servings). DIABETES MEAL PLANNING WORKSHEET Your dietician can use this worksheet to help you decide how many servings of foods and what types of foods are right for you.  BREAKFAST Food Group and Servings / Carb Servings Grain/Starches __________________________________ Dairy __________________________________________ Vegetable ______________________________________ Fruit ___________________________________________ Meat __________________________________________ Fat ____________________________________________ LUNCH Food Group and Servings / Carb Servings Grain/Starches ___________________________________ Dairy ___________________________________________ Fruit ____________________________________________ Meat ___________________________________________ Fat _____________________________________________ [place] Food Group and Servings / Carb Servings Grain/Starches ___________________________________ Dairy ___________________________________________ Fruit ____________________________________________ Meat ___________________________________________ Fat _____________________________________________ SNACKS Food Group and Servings / Carb Servings Grain/Starches ___________________________________ Dairy ___________________________________________ Vegetable _______________________________________ Fruit ____________________________________________ Meat ___________________________________________ Fat  _____________________________________________ DAILY TOTALS Starches _________________________ Vegetable ________________________ Fruit ____________________________ Dairy ____________________________ Meat ____________________________ Fat ______________________________ Document Released: 05/27/2005 Document Revised: 11/22/2011 Document Reviewed: 04/07/2009 [employer] Patient Information 2014 [employer]  DASH Diet The DASH diet stands for "Dietary Approaches to Stop Hypertension." It is a healthy eating plan that has been shown to reduce high blood pressure (hypertension) in as little as 14 days, while also possibly providing other significant health benefits. These other health benefits include reducing the risk of breast cancer after menopause and reducing the risk of type 2 diabetes, heart disease, colon cancer, and stroke. Health benefits also include weight loss and slowing kidney failure in patients with chronic kidney disease.  DIET GUIDELINES  Limit salt (sodium). Your diet should contain less than 1500 mg of sodium daily.  Limit refined or processed carbohydrates. Your diet should include mostly whole grains. Desserts and added sugars should be used sparingly.  Include small amounts of heart-healthy fats. These types of fats include nuts, oils, and tub margarine. Limit saturated and trans fats. These fats have been shown to be harmful in the body. CHOOSING FOODS  The following food groups are based on a 2000 calorie diet. See your Registered Dietitian for individual calorie needs. Grains and Grain Products (6 to 8 servings daily)  Eat More Often: Whole-wheat bread, brown rice, whole-grain or wheat pasta, quinoa, popcorn without added fat or salt (air popped).  Eat Less Often: White bread, white pasta, white rice, cornbread. Vegetables (4 to 5 servings daily)  Eat More Often: Fresh, frozen, and canned vegetables. Vegetables may be raw, steamed, roasted, or grilled with a minimal  amount of fat.  Eat Less Often/Avoid: Creamed or fried vegetables. Vegetables in a cheese sauce. Fruit (4 to 5 servings daily)  Eat More Often: All fresh, canned (in natural juice), or frozen fruits. Dried fruits without added sugar. One hundred percent fruit juice ( cup [237 mL] daily).  Eat Less Often: Dried fruits with added sugar. Canned fruit in light or heavy syrup. [employer], Fish, and Poultry (2 servings or less daily. One serving is 3 to 4 oz [85-114 g]).  Eat More Often: Ninety percent or leaner ground beef, tenderloin, sirloin. Round cuts of beef, chicken breast, [place] breast. All fish. Grill, bake, or broil your meat. Nothing should be fried.  Eat Less Often/Avoid: Fatty cuts of meat, [place], or chicken leg, thigh, or wing. Fried cuts of meat or fish. Dairy (2 to 3 servings)  Eat More Often: Low-fat or fat-free milk, low-fat plain or light yogurt, reduced-fat or part-skim cheese.  Eat Less Often/Avoid: Milk (whole, 2%).Whole milk yogurt. Full-fat cheeses. Nuts, Seeds, and Legumes (4 to 5 servings per week)  Eat More Often: All without added salt.  Eat Less Often/Avoid: Salted nuts and seeds, canned beans with added salt. Fats and Sweets (limited)  Eat More Often: Vegetable oils, tub margarines without trans fats, sugar-free gelatin. Mayonnaise and salad dressings.  Eat Less Often/Avoid: Coconut oils, palm oils, butter, stick margarine, cream, half and half, cookies, candy, pie. FOR MORE INFORMATION [employer]  Diet Eating Plan: www.dashdiet.org Document Released: 08/19/2011 Document Revised: 11/22/2011 Document Reviewed: 08/19/2011 [facility] Patient Information 2014 [place], [place].  Type 2 Diabetes Mellitus, Adult Type 2 diabetes mellitus, often simply referred to as type 2 diabetes, is a long-lasting (chronic) disease. In type 2 diabetes, the pancreas does not make enough insulin (a hormone), the cells are less responsive to the insulin that is made (insulin  resistance), or both. Normally, insulin moves sugars from food into the tissue cells. The tissue cells use the sugars for energy. The lack of insulin or the lack of normal response to insulin causes excess sugars to build up in the blood instead of going into the tissue cells. As a result, high blood sugar (hyperglycemia) develops. The effect of high sugar (glucose) levels can cause many complications. Type 2 diabetes was also previously called adult-onset diabetes but it can occur at any age.  RISK FACTORS  A person is predisposed to developing type 2 diabetes if someone in the family has the disease and also has one or more of the following primary risk factors:  Overweight.  An inactive lifestyle.  A history of consistently eating high-calorie foods. Maintaining a normal weight and regular physical activity can reduce the chance of developing type 2 diabetes. SYMPTOMS  A person with type 2 diabetes may not show symptoms initially. The symptoms of type 2 diabetes appear slowly. The symptoms include:  Increased thirst (polydipsia).  Increased urination (polyuria).  Increased urination during the night (nocturia).  Weight loss. This weight loss may be rapid.  Frequent, recurring infections.  Tiredness (fatigue).  Weakness.  Vision changes, such as blurred vision.  Fruity smell to your breath.  Abdominal pain.  Nausea or vomiting.  Cuts or bruises which are slow to heal.  Tingling or numbness in the hands or feet. DIAGNOSIS Type 2 diabetes is frequently not diagnosed until complications of diabetes are present. Type 2 diabetes is diagnosed when symptoms or complications are present and when blood glucose levels are increased. Your blood glucose level may be checked by one or more of the following blood tests:  A fasting blood glucose test. You will not be allowed to eat for at least 8 hours before a blood sample is taken.  A random blood glucose test. Your blood glucose  is checked at any time of the day regardless of when you ate.  A hemoglobin A1c blood glucose test. A hemoglobin A1c test provides information about blood glucose control over the previous 3 months.  An oral glucose tolerance test (OGTT). Your blood glucose is measured after you have not eaten (fasted) for 2 hours and then after you drink a glucose-containing beverage. TREATMENT   You may need to take insulin or diabetes medicine daily to keep blood glucose levels in the desired range.  You will need to match insulin dosing with exercise and healthy food choices. The treatment goal is to maintain the before meal blood sugar (preprandial glucose) level at 70 130 mg/dL. HOME CARE INSTRUCTIONS   Have your hemoglobin A1c level checked twice a year.  Perform daily blood glucose monitoring as directed by your caregiver.  Monitor urine ketones when you are ill and as directed by your caregiver.  Take your diabetes medicine or insulin as directed by your caregiver to maintain your blood glucose levels in the desired range.  Never run out of diabetes medicine or insulin. It is needed every day.  Adjust insulin based on your intake of carbohydrates. Carbohydrates can raise blood glucose  levels but need to be included in your diet. Carbohydrates provide vitamins, minerals, and fiber which are an essential part of a healthy diet. Carbohydrates are found in fruits, vegetables, whole grains, dairy products, legumes, and foods containing added sugars.    Eat healthy foods. Alternate 3 meals with 3 snacks.  Lose weight if overweight.  Carry a medical alert card or wear your medical alert jewelry.  Carry a 15 gram carbohydrate snack with you at all times to treat low blood glucose (hypoglycemia). Some examples of 15 gram carbohydrate snacks include:  Glucose tablets, 3 or 4   Glucose gel, 15 gram tube  Raisins, 2 tablespoons (24 grams)  Jelly beans, 6  Animal crackers, 8  Regular pop, 4  ounces (120 mL)  Gummy treats, 9  Recognize hypoglycemia. Hypoglycemia occurs with blood glucose levels of 70 mg/dL and below. The risk for hypoglycemia increases when fasting or skipping meals, during or after intense exercise, and during sleep. Hypoglycemia symptoms can include:  Tremors or shakes.  Decreased ability to concentrate.  Sweating.  Increased heart rate.  Headache.  Dry mouth.  Hunger.  Irritability.  Anxiety.  Restless sleep.  Altered speech or coordination.  Confusion.  Treat hypoglycemia promptly. If you are alert and able to safely swallow, follow the 15:15 rule:  Take 15 20 grams of rapid-acting glucose or carbohydrate. Rapid-acting options include glucose gel, glucose tablets, or 4 ounces (120 mL) of fruit juice, regular soda, or low fat milk.  Check your blood glucose level 15 minutes after taking the glucose.  Take 15 20 grams more of glucose if the repeat blood glucose level is still 70 mg/dL or below.  Eat a meal or snack within 1 hour once blood glucose levels return to normal.    Be alert to polyuria and polydipsia which are early signs of hyperglycemia. An early awareness of hyperglycemia allows for prompt treatment. Treat hyperglycemia as directed by your caregiver.  Engage in at least 150 minutes of moderate-intensity physical activity a week, spread over at least 3 days of the week or as directed by your caregiver. In addition, you should engage in resistance exercise at least 2 times a week or as directed by your caregiver.  Adjust your medicine and food intake as needed if you start a new exercise or sport.  Follow your sick day plan at any time you are unable to eat or drink as usual.  Avoid tobacco use.  Limit alcohol intake to no more than 1 drink per day for nonpregnant women and 2 drinks per day for men. You should drink alcohol only when you are also eating food. Talk with your caregiver whether alcohol is safe for you. Tell  your caregiver if you drink alcohol several times a week.  Follow up with your caregiver regularly.  Schedule an eye exam soon after the diagnosis of type 2 diabetes and then annually.  Perform daily skin and foot care. Examine your skin and feet daily for cuts, bruises, redness, nail problems, bleeding, blisters, or sores. A foot exam by a caregiver should be done annually.  Brush your teeth and gums at least twice a day and floss at least once a day. Follow up with your dentist regularly.  Share your diabetes management plan with your workplace or school.  Stay up-to-date with immunizations.  Learn to manage stress.  Obtain ongoing diabetes education and support as needed.  Participate in, or seek rehabilitation as needed to maintain or improve independence and quality  of life. Request a physical or occupational therapy referral if you are having foot or hand numbness or difficulties with grooming, dressing, eating, or physical activity. SEEK MEDICAL CARE IF:   You are unable to eat food or drink fluids for more than 6 hours.  You have nausea and vomiting for more than 6 hours.  Your blood glucose level is over 240 mg/dL.  There is a change in mental status.  You develop an additional serious illness.  You have diarrhea for more than 6 hours.  You have been sick or have had a fever for a couple of days and are not getting better.  You have pain during any physical activity.  SEEK IMMEDIATE MEDICAL CARE IF:  You have difficulty breathing.  You have moderate to large ketone levels. MAKE SURE YOU:  Understand these instructions.  Will watch your condition.  Will get help right away if you are not doing well or get worse. Document Released: 08/30/2005 Document Revised: 05/24/2012 Document Reviewed: 03/28/2012 [facility] Patient Information 2014 [place], [place].  Hypertension As your heart beats, it forces blood through your arteries. This force is your blood pressure.  If the pressure is too high, it is called hypertension (HTN) or high blood pressure. HTN is dangerous because you may have it and not know it. High blood pressure may mean that your heart has to work harder to pump blood. Your arteries may be narrow or stiff. The extra work puts you at risk for heart disease, stroke, and other problems.  Blood pressure consists of two numbers, a higher number over a lower, 110/72, for example. It is stated as "110 over 72." The ideal is below 120 for the top number (systolic) and under 80 for the bottom (diastolic). Write down your blood pressure today. You should pay close attention to your blood pressure if you have certain conditions such as:  Heart failure.  Prior heart attack.  Diabetes  Chronic kidney disease.  Prior stroke.  Multiple risk factors for heart disease. To see if you have HTN, your blood pressure should be measured while you are seated with your arm held at the level of the heart. It should be measured at least twice. A one-time elevated blood pressure reading (especially in the Emergency Department) does not mean that you need treatment. There may be conditions in which the blood pressure is different between your right and left arms. It is important to see your caregiver soon for a recheck. Most people have essential hypertension which means that there is not a specific cause. This type of high blood pressure may be lowered by changing lifestyle factors such as:  Stress.  Smoking.  Lack of exercise.  Excessive weight.  Drug/tobacco/alcohol use.  Eating less salt. Most people do not have symptoms from high blood pressure until it has caused damage to the body. Effective treatment can often prevent, delay or reduce that damage. TREATMENT  When a cause has been identified, treatment for high blood pressure is directed at the cause. There are a large number of medications to treat HTN. These fall into several categories, and your  caregiver will help you select the medicines that are best for you. Medications may have side effects. You should review side effects with your caregiver. If your blood pressure stays high after you have made lifestyle changes or started on medicines,   Your medication(s) may need to be changed.  Other problems may need to be addressed.  Be certain you understand your  prescriptions, and know how and when to take your medicine.  Be sure to follow up with your caregiver within the time frame advised (usually within two weeks) to have your blood pressure rechecked and to review your medications.  If you are taking more than one medicine to lower your blood pressure, make sure you know how and at what times they should be taken. Taking two medicines at the same time can result in blood pressure that is too low. SEEK IMMEDIATE MEDICAL CARE IF:  You develop a severe headache, blurred or changing vision, or confusion.  You have unusual weakness or numbness, or a faint feeling.  You have severe chest or abdominal pain, vomiting, or breathing problems. MAKE SURE YOU:   Understand these instructions.  Will watch your condition.  Will get help right away if you are not doing well or get worse. Document Released: 08/30/2005 Document Revised: 11/22/2011 Document Reviewed: 04/19/2008 [facility] Patient Information 2014 [place], [place].

## 2013-03-15 NOTE — Assessment & Plan Note (Signed)
Lipid Panel     Component Value Date/Time   CHOL 191 05/30/2012 1558   TRIG 142 05/30/2012 1558   HDL 52 05/30/2012 1558   CHOLHDL 3.7 05/30/2012 1558   VLDL 28 05/30/2012 1558   LDLCALC 111* 05/30/2012 1558   Continue statin.  Consider repeat lipid panel 05/2013

## 2013-03-15 NOTE — Progress Notes (Signed)
  Subjective:    Patient ID: Sheila Ferguson, female    DOB: 1961/05/30, 51 y.o.   MRN: [id number]  HPI Comments: 52 y.o PMH DM 2 (HA1C 8.4) with average glucose of 194, cbg today 192, HTN (BP 149/83 today).  She presents for follow up for HTN after recently being started on 12.5 mg HCTZ.  She states initially her HCTZ was causing abdominal pain and possibly diarrhea but sx's are now improved.  She is also compliant with lisinopril and Inderal.  Otherwise she wants to disc. The results of her retinal exam last visit with showed moderate blood vessel disease.  She is taking NPH 22u am and 23 u pm, Metformin and Glipizide.  She needs a referral to see Dr. Nile Riggs due to it being over a year.  She is also due for pap smear which will due at a latter date and she does have an OB/GYN but unsure if they accept her orange card.   She does not wish to speak with Gavin Pound today  SH: unemployed, orange card      Review of Systems  Cardiovascular: Positive for leg swelling. Negative for chest pain.  Gastrointestinal: Negative for abdominal pain, diarrhea and constipation.       Objective:   Physical Exam  Nursing note and vitals reviewed. Constitutional: She is oriented to person, place, and time. She appears well-developed and well-nourished. She is cooperative. No distress.  HENT:  Head: Normocephalic and atraumatic.  Mouth/Throat: Oropharynx is clear and moist and mucous membranes are normal. No oropharyngeal exudate.  Eyes: Conjunctivae are normal. Pupils are equal, round, and reactive to light. Right eye exhibits no discharge. Left eye exhibits no discharge. No scleral icterus.  Cardiovascular: Normal rate, regular rhythm, S1 normal, S2 normal and normal heart sounds.   No murmur heard. 1+ edema b/l lower ext  Pulmonary/Chest: Effort normal and breath sounds normal. No respiratory distress. She has no wheezes.  Abdominal: Soft. Bowel sounds are normal. There is no tenderness.  Obese  ab  Neurological: She is alert and oriented to person, place, and time. She has normal strength. Gait normal.  Skin: Skin is warm, dry and intact. No rash noted. She is not diaphoretic.  Psychiatric: She has a normal mood and affect. Her speech is normal and behavior is normal. Judgment and thought content normal. Cognition and memory are normal.          Assessment & Plan:  F/u in 2-3 months with PCP f/u HTN, DM Consider pelvic in future if patient is sexually active she asked about a pap smear today though she has had a hysterectomy  Shirlee Latch MD [phone]

## 2013-03-19 ENCOUNTER — Other Ambulatory Visit: Payer: Self-pay | Admitting: *Deleted

## 2013-03-19 ENCOUNTER — Encounter: Payer: Self-pay | Admitting: Internal Medicine

## 2013-03-21 NOTE — Progress Notes (Signed)
Case discussed with Dr. McLean at the time of the visit.  We reviewed the resident's history and exam and pertinent patient test results.  I agree with the assessment, diagnosis, and plan of care documented in the resident's note.     

## 2013-04-05 ENCOUNTER — Other Ambulatory Visit: Payer: Self-pay | Admitting: Internal Medicine

## 2013-04-17 ENCOUNTER — Encounter: Payer: Self-pay | Admitting: Internal Medicine

## 2013-04-30 ENCOUNTER — Encounter: Payer: Self-pay | Admitting: Internal Medicine

## 2013-04-30 ENCOUNTER — Telehealth: Payer: Self-pay | Admitting: *Deleted

## 2013-04-30 NOTE — Telephone Encounter (Signed)
Call from pt needs a letter stating that she can take a Physical Education class at [facility].  Pt needs the letter before Thursday.  Pt can be reached at [phone] for questions.  Call to patient to ask who needs to get the letter.  No answer.  Message left that a message is being sent to Dr. Shirlee Latch.  Angelina Ok, RN 04/30/2013 4:13 PM

## 2013-05-18 ENCOUNTER — Other Ambulatory Visit: Payer: Self-pay | Admitting: Internal Medicine

## 2013-06-08 ENCOUNTER — Other Ambulatory Visit: Payer: Self-pay | Admitting: *Deleted

## 2013-06-08 DIAGNOSIS — IMO0001 Reserved for inherently not codable concepts without codable children: Secondary | ICD-10-CM

## 2013-06-08 NOTE — Telephone Encounter (Signed)
Pt would like insulin pens and 90 day supplies of meds

## 2013-06-09 MED ORDER — INSULIN NPH (HUMAN) (ISOPHANE) 100 UNIT/ML ~~LOC~~ SUSP: SUBCUTANEOUS | Status: DC

## 2013-06-09 MED ORDER — METFORMIN HCL 500 MG PO TABS: 500.0000 mg | ORAL_TABLET | Freq: Two times a day (BID) | ORAL | Status: DC

## 2013-06-12 NOTE — Telephone Encounter (Signed)
Meds refilled.

## 2013-07-12 ENCOUNTER — Encounter: Payer: Self-pay | Admitting: Internal Medicine

## 2013-08-08 ENCOUNTER — Other Ambulatory Visit: Payer: Self-pay | Admitting: *Deleted

## 2013-08-08 MED ORDER — GLIPIZIDE 10 MG PO TABS: 10.0000 mg | ORAL_TABLET | Freq: Every day | ORAL | Status: DC

## 2013-08-20 ENCOUNTER — Other Ambulatory Visit: Payer: Self-pay | Admitting: *Deleted

## 2013-08-20 MED ORDER — CITALOPRAM HYDROBROMIDE 40 MG PO TABS: 40.0000 mg | ORAL_TABLET | Freq: Every day | ORAL | Status: DC

## 2013-08-27 ENCOUNTER — Ambulatory Visit: Payer: Self-pay

## 2013-08-31 ENCOUNTER — Ambulatory Visit: Payer: Self-pay

## 2013-10-04 ENCOUNTER — Ambulatory Visit (INDEPENDENT_AMBULATORY_CARE_PROVIDER_SITE_OTHER): Payer: No Typology Code available for payment source | Admitting: Internal Medicine

## 2013-10-04 ENCOUNTER — Encounter: Payer: Self-pay | Admitting: Internal Medicine

## 2013-10-04 VITALS — BP 155/84 | HR 78 | Temp 98.4°F | Ht 64.0 in | Wt 314.9 lb

## 2013-10-04 DIAGNOSIS — R609 Edema, unspecified: Secondary | ICD-10-CM

## 2013-10-04 DIAGNOSIS — R600 Localized edema: Secondary | ICD-10-CM

## 2013-10-04 DIAGNOSIS — IMO0001 Reserved for inherently not codable concepts without codable children: Secondary | ICD-10-CM

## 2013-10-04 DIAGNOSIS — Z23 Encounter for immunization: Secondary | ICD-10-CM

## 2013-10-04 DIAGNOSIS — Z0000 Encounter for general adult medical examination without abnormal findings: Secondary | ICD-10-CM

## 2013-10-04 DIAGNOSIS — M25562 Pain in left knee: Secondary | ICD-10-CM

## 2013-10-04 DIAGNOSIS — M25561 Pain in right knee: Secondary | ICD-10-CM

## 2013-10-04 DIAGNOSIS — M6530 Trigger finger, unspecified finger: Secondary | ICD-10-CM

## 2013-10-04 DIAGNOSIS — M25569 Pain in unspecified knee: Secondary | ICD-10-CM

## 2013-10-04 DIAGNOSIS — E1165 Type 2 diabetes mellitus with hyperglycemia: Principal | ICD-10-CM

## 2013-10-04 DIAGNOSIS — I10 Essential (primary) hypertension: Secondary | ICD-10-CM

## 2013-10-04 DIAGNOSIS — E785 Hyperlipidemia, unspecified: Secondary | ICD-10-CM

## 2013-10-04 DIAGNOSIS — M65312 Trigger thumb, left thumb: Secondary | ICD-10-CM

## 2013-10-04 LAB — COMPLETE METABOLIC PANEL WITH GFR
ALT: 18 U/L (ref 0–35)
AST: 19 U/L (ref 0–37)
Albumin: 3.9 g/dL (ref 3.5–5.2)
Alkaline Phosphatase: 117 U/L (ref 39–117)
BUN: 12 mg/dL (ref 6–23)
CALCIUM: 8.9 mg/dL (ref 8.4–10.5)
CHLORIDE: 100 meq/L (ref 96–112)
CO2: 28 mEq/L (ref 19–32)
CREATININE: 1.07 mg/dL (ref 0.50–1.10)
GFR, EST AFRICAN AMERICAN: 69 mL/min
GFR, EST NON AFRICAN AMERICAN: 60 mL/min
Glucose, Bld: 219 mg/dL — ABNORMAL HIGH (ref 70–99)
Potassium: 4 mEq/L (ref 3.5–5.3)
Sodium: 138 mEq/L (ref 135–145)
Total Bilirubin: 0.3 mg/dL (ref 0.3–1.2)
Total Protein: 7.3 g/dL (ref 6.0–8.3)

## 2013-10-04 LAB — LIPID PANEL
CHOLESTEROL: 202 mg/dL — AB (ref 0–200)
HDL: 53 mg/dL (ref >39)
LDL Cholesterol: 111 mg/dL — ABNORMAL HIGH (ref 0–99)
TRIGLYCERIDES: 189 mg/dL — AB (ref <150)
Total CHOL/HDL Ratio: 3.8 Ratio
VLDL: 38 mg/dL (ref 0–40)

## 2013-10-04 LAB — POCT GLYCOSYLATED HEMOGLOBIN (HGB A1C): Hemoglobin A1C: 11.9

## 2013-10-04 LAB — GLUCOSE, CAPILLARY: Glucose-Capillary: 223 mg/dL — ABNORMAL HIGH (ref 70–99)

## 2013-10-04 MED ORDER — INSULIN NPH (HUMAN) (ISOPHANE) 100 UNIT/ML ~~LOC~~ SUSP: SUBCUTANEOUS | Status: DC

## 2013-10-04 MED ORDER — METFORMIN HCL 500 MG PO TABS: 1000.0000 mg | ORAL_TABLET | Freq: Two times a day (BID) | ORAL | Status: DC

## 2013-10-04 NOTE — Patient Instructions (Addendum)
General Instructions:  We will make you an appt with Sports Medicine for your trigger finger and knee pain You need to follow up with mammogram. We will refer you You can try Tylenol for knee pain.  Please take your medications as instructed  NPH now 26 units in the morning and 28 units at night plus Metformin 1000 mg 2x per day  See changes in your diabetes medication  Try to get a mammogram and follow up with Dr. Nile Riggs.  Follow up in 3 months   Treatment Goals:  Goals (1 Years of Data) as of 10/04/13         As of Today As of Today 03/15/13 03/01/13 02/08/13     Blood Pressure    . Blood Pressure < 140/90  155/84 173/91 149/83 192/88 125/84     Result Component    . HEMOGLOBIN A1C < 7.0  11.9  8.4      . LDL CALC < 100            Progress Toward Treatment Goals:  Treatment Goal 10/04/2013  Hemoglobin A1C deteriorated  Blood pressure deteriorated    Self Care Goals & Plans:  Self Care Goal 10/04/2013  Manage my medications refill my medications on time; take my medicines as prescribed; bring my medications to every visit  Monitor my health check my feet daily; keep track of my blood glucose; bring my glucose meter and log to each visit; keep track of my blood pressure; bring my blood pressure log to each visit; keep track of my weight  Eat healthy foods eat foods that are low in salt; eat baked foods instead of fried foods; drink diet soda or water instead of juice or soda; eat more vegetables; eat fruit for snacks and desserts; eat smaller portions  Be physically active find an activity I enjoy; take a walk every day  Meeting treatment goals maintain the current self-care plan    Home Blood Glucose Monitoring 10/04/2013  Check my blood sugar 3 times a day  When to check my blood sugar before meals     Care Management & Community Referrals:  Referral 10/04/2013  Referrals made for care management support none needed  Referrals made to community resources none        Knee Pain Knee pain can be a result of an injury or other medical conditions. Treatment will depend on the cause of your pain. HOME CARE  Only take medicine as told by your doctor.  Keep a healthy weight. Being overweight can make the knee hurt more.  Stretch before exercising or playing sports.  If there is constant knee pain, change the way you exercise. Ask your doctor for advice.  Make sure shoes fit well. Choose the right shoe for the sport or activity.  Protect your knees. Wear kneepads if needed.  Rest when you are tired. GET HELP RIGHT AWAY IF:   Your knee pain does not stop.  Your knee pain does not get better.  Your knee joint feels hot to the touch.  You have a fever. MAKE SURE YOU:   Understand these instructions.  Will watch this condition.  Will get help right away if you are not doing well or get worse. Document Released: 11/26/2008 Document Revised: 11/22/2011 Document Reviewed: 11/26/2008 [facility] Patient Information 2014 [place], [place].  DASH Diet The DASH diet stands for "Dietary Approaches to Stop Hypertension." It is a healthy eating plan that has been shown to reduce high blood pressure (  hypertension) in as little as 14 days, while also possibly providing other significant health benefits. These other health benefits include reducing the risk of breast cancer after menopause and reducing the risk of type 2 diabetes, heart disease, colon cancer, and stroke. Health benefits also include weight loss and slowing kidney failure in patients with chronic kidney disease.  DIET GUIDELINES  Limit salt (sodium). Your diet should contain less than 1500 mg of sodium daily.  Limit refined or processed carbohydrates. Your diet should include mostly whole grains. Desserts and added sugars should be used sparingly.  Include small amounts of heart-healthy fats. These types of fats include nuts, oils, and tub margarine. Limit saturated and trans fats. These  fats have been shown to be harmful in the body. CHOOSING FOODS  The following food groups are based on a 2000 calorie diet. See your Registered Dietitian for individual calorie needs. Grains and Grain Products (6 to 8 servings daily)  Eat More Often: Whole-wheat bread, brown rice, whole-grain or wheat pasta, quinoa, popcorn without added fat or salt (air popped).  Eat Less Often: White bread, white pasta, white rice, cornbread. Vegetables (4 to 5 servings daily)  Eat More Often: Fresh, frozen, and canned vegetables. Vegetables may be raw, steamed, roasted, or grilled with a minimal amount of fat.  Eat Less Often/Avoid: Creamed or fried vegetables. Vegetables in a cheese sauce. Fruit (4 to 5 servings daily)  Eat More Often: All fresh, canned (in natural juice), or frozen fruits. Dried fruits without added sugar. One hundred percent fruit juice ( cup [237 mL] daily).  Eat Less Often: Dried fruits with added sugar. Canned fruit in light or heavy syrup. [employer], Fish, and Poultry (2 servings or less daily. One serving is 3 to 4 oz [85-114 g]).  Eat More Often: Ninety percent or leaner ground beef, tenderloin, sirloin. Round cuts of beef, chicken breast, [place] breast. All fish. Grill, bake, or broil your meat. Nothing should be fried.  Eat Less Often/Avoid: Fatty cuts of meat, [place], or chicken leg, thigh, or wing. Fried cuts of meat or fish. Dairy (2 to 3 servings)  Eat More Often: Low-fat or fat-free milk, low-fat plain or light yogurt, reduced-fat or part-skim cheese.  Eat Less Often/Avoid: Milk (whole, 2%).Whole milk yogurt. Full-fat cheeses. Nuts, Seeds, and Legumes (4 to 5 servings per week)  Eat More Often: All without added salt.  Eat Less Often/Avoid: Salted nuts and seeds, canned beans with added salt. Fats and Sweets (limited)  Eat More Often: Vegetable oils, tub margarines without trans fats, sugar-free gelatin. Mayonnaise and salad dressings.  Eat Less Often/Avoid:  Coconut oils, palm oils, butter, stick margarine, cream, half and half, cookies, candy, pie. FOR MORE INFORMATION The Dash Diet Eating Plan: www.dashdiet.org Document Released: 08/19/2011 Document Revised: 11/22/2011 Document Reviewed: 08/19/2011 [facility] Patient Information 2014 [place], [place].  Hypertension As your heart beats, it forces blood through your arteries. This force is your blood pressure. If the pressure is too high, it is called hypertension (HTN) or high blood pressure. HTN is dangerous because you may have it and not know it. High blood pressure may mean that your heart has to work harder to pump blood. Your arteries may be narrow or stiff. The extra work puts you at risk for heart disease, stroke, and other problems.  Blood pressure consists of two numbers, a higher number over a lower, 110/72, for example. It is stated as "110 over 72." The ideal is below 120 for the top number (systolic)  and under 80 for the bottom (diastolic). Write down your blood pressure today. You should pay close attention to your blood pressure if you have certain conditions such as:  Heart failure.  Prior heart attack.  Diabetes  Chronic kidney disease.  Prior stroke.  Multiple risk factors for heart disease. To see if you have HTN, your blood pressure should be measured while you are seated with your arm held at the level of the heart. It should be measured at least twice. A one-time elevated blood pressure reading (especially in the Emergency Department) does not mean that you need treatment. There may be conditions in which the blood pressure is different between your right and left arms. It is important to see your caregiver soon for a recheck. Most people have essential hypertension which means that there is not a specific cause. This type of high blood pressure may be lowered by changing lifestyle factors such as:  Stress.  Smoking.  Lack of exercise.  Excessive  weight.  Drug/tobacco/alcohol use.  Eating less salt. Most people do not have symptoms from high blood pressure until it has caused damage to the body. Effective treatment can often prevent, delay or reduce that damage. TREATMENT  When a cause has been identified, treatment for high blood pressure is directed at the cause. There are a large number of medications to treat HTN. These fall into several categories, and your caregiver will help you select the medicines that are best for you. Medications may have side effects. You should review side effects with your caregiver. If your blood pressure stays high after you have made lifestyle changes or started on medicines,   Your medication(s) may need to be changed.  Other problems may need to be addressed.  Be certain you understand your prescriptions, and know how and when to take your medicine.  Be sure to follow up with your caregiver within the time frame advised (usually within two weeks) to have your blood pressure rechecked and to review your medications.  If you are taking more than one medicine to lower your blood pressure, make sure you know how and at what times they should be taken. Taking two medicines at the same time can result in blood pressure that is too low. SEEK IMMEDIATE MEDICAL CARE IF:  You develop a severe headache, blurred or changing vision, or confusion.  You have unusual weakness or numbness, or a faint feeling.  You have severe chest or abdominal pain, vomiting, or breathing problems. MAKE SURE YOU:   Understand these instructions.  Will watch your condition.  Will get help right away if you are not doing well or get worse. Document Released: 08/30/2005 Document Revised: 11/22/2011 Document Reviewed: 04/19/2008 [facility] Patient Information 2014 [place], [place].  Type 2 Diabetes Mellitus, Adult Type 2 diabetes mellitus is a long-term (chronic) disease. In type 2 diabetes:  The pancreas does not make  enough of a hormone called insulin.  The cells in the body do not respond as well to the insulin that is made.  Both of the above can happen. Normally, insulin moves sugars from food into tissue cells. This gives you energy. If you have type 2 diabetes, sugars cannot be moved into tissue cells. This causes high blood sugar (hyperglycemia).  HOME CARE  Have your hemoglobin A1c level checked twice a year. The level shows if your diabetes is under control or out of control.  Perform daily blood sugar testing as told by your doctor.  Check your ketone  levels by testing your pee (urine) when you are sick and as told.  Take your diabetes or insulin medicine as told by your doctor.  Never run out of insulin.  Adjust how much insulin you give yourself based on how many carbs (carbohydrates) you eat. Carbs are in many foods, such as fruits, vegetables, whole grains, and dairy products.  Have a healthy snack between every healthy meal. Have 3 meals and 3 snacks a day.  Lose weight if you are overweight.  Carry a medical alert card or wear your medical alert jewelry.  Carry a 15 gram carb snack with you at all times. Examples include:  Glucose pills, 3 or 4.  Glucose gel, 15 gram tube.  Raisins, 2 tablespoons (24 grams).  Jelly beans, 6.  Animal crackers, 8.  Sugar pop, 4 ounces (120 milliliters).  Gummy treats, 9.  Notice low blood sugar (hypoglycemia) symptoms, such as:  Shaking (tremors).  Decreased ability to think clearly.  Sweating.  Increased heart rate.  Headache.  Dry mouth.  Hunger.  Crabbiness (irritability).  Being worried or tense (anxiety).  Restless sleep.  A change in speech or coordination.  Confusion.  Treat low blood sugar right away. If you are alert and can swallow, follow the 15:15 rule:  Take 15 20 grams of a rapid-acting glucose or carb. This includes glucose gel, glucose pills, or 4 ounces (120 milliliters) of fruit juice, regular  pop, or low-fat milk.  Check your blood sugar level after taking the glucose.  Take 15 20 grams of more glucose if the repeat blood sugar level is still 70 mg/dL (milligrams/deciliter) or below.  Eat a meal or snack within 1 hour of the blood sugar levels going back to normal.  Notice early symptoms of high blood sugar, such as:  Being really thirsty or drinking a lot (polydipsia).  Peeing (urinating) a lot (polyuria).  Do at least 150 minutes of physical activity a week or as told.  Split the 150 minutes of activity up during the week. Do not do 150 minutes of activity in one day.  Perform exercises, such as weight lifting, at least 2 times a week or as told.  Adjust your insulin or food intake as needed if you start a new exercise or sport.  Follow your sick day plan when you are not able to eat or drink as usual.  Avoid tobacco use.  Women who are not pregnant should drink no more than 1 drink a day. Men should drink no more than 2 drinks a day.  Only drink alcohol with food.  Ask your doctor if alcohol is safe for you.  Tell your doctor if you drink alcohol several times during the week.  See your doctor regularly.  Schedule an eye exam soon after you are diagnosed with diabetes. Schedule exams once every year.  Check your skin and feet every day. Check for cuts, bruises, redness, nail problems, bleeding, blisters, or sores. A doctor should do a foot exam once a year.  Brush your teeth and gums twice a day. Floss once a day. Visit your dentist regularly.  Share your diabetes plan with your workplace or school.  Stay up-to-date with shots that fight against diseases (immunizations).  Learn how to manage stress.  Get diabetes education and support as needed.  Ask your doctor for special help if:  You need help to maintain or improve how you to do things on your own.  You need help to maintain or improve  the quality of your life.  You have foot or hand  problems.  You have trouble cleaning yourself, dressing, eating, or doing physical activity. GET HELP RIGHT AWAY IF:  You have trouble breathing.  You have moderate to large ketone levels.  You are unable to eat food or drink fluids for more than 6 hours.  You feel sick to your stomach (nauseous) or throw up (vomit) for more than 6 hours.  Your blood sugar level is over 240 mg/dL.  There is a change in mental status.  You get another serious illness.  You have watery poop (diarrhea) for more than 6 hours.  You have been sick or have had a fever for 2 or more days and are not getting better.  You have pain when you are physically active. MAKE SURE YOU:  Understand these instructions.  Will watch your condition.  Will get help right away if you are not doing well or get worse. Document Released: 06/08/2008 Document Revised: 06/20/2013 Document Reviewed: 12/29/2012 [facility] Patient Information 2014 [place], [place].  Diabetes and Exercise Exercising regularly is important. It is not just about losing weight. It has many health benefits, such as:  Improving your overall fitness, flexibility, and endurance.  Increasing your bone density.  Helping with weight control.  Decreasing your body fat.  Increasing your muscle strength.  Reducing stress and tension.  Improving your overall health. People with diabetes who exercise gain additional benefits because exercise:  Reduces appetite.  Improves the body's use of blood sugar (glucose).  Helps lower or control blood glucose.  Decreases blood pressure.  Helps control blood lipids (such as cholesterol and triglycerides).  Improves the body's use of the hormone insulin by:  Increasing the body's insulin sensitivity.  Reducing the body's insulin needs.  Decreases the risk for heart disease because exercising:  Lowers cholesterol and triglycerides levels.  Increases the levels of good cholesterol (such as  high-density lipoproteins [HDL]) in the body.  Lowers blood glucose levels. YOUR ACTIVITY PLAN  Choose an activity that you enjoy and set realistic goals. Your health care provider or diabetes educator can help you make an activity plan that works for you. You can break activities into 2 or 3 sessions throughout the day. Doing so is as good as one long session. Exercise ideas include:  Taking the dog for a walk.  Taking the stairs instead of the elevator.  Dancing to your favorite song.  Doing your favorite exercise with a friend. RECOMMENDATIONS FOR EXERCISING WITH TYPE 1 OR TYPE 2 DIABETES   Check your blood glucose before exercising. If blood glucose levels are greater than 240 mg/dL, check for urine ketones. Do not exercise if ketones are present.  Avoid injecting insulin into areas of the body that are going to be exercised. For example, avoid injecting insulin into:  The arms when playing tennis.  The legs when jogging.  Keep a record of:  Food intake before and after you exercise.  Expected peak times of insulin action.  Blood glucose levels before and after you exercise.  The type and amount of exercise you have done.  Review your records with your health care provider. Your health care provider will help you to develop guidelines for adjusting food intake and insulin amounts before and after exercising.  If you take insulin or oral hypoglycemic agents, watch for signs and symptoms of hypoglycemia. They include:  Dizziness.  Shaking.  Sweating.  Chills.  Confusion.  Drink plenty of water while you  exercise to prevent dehydration or heat stroke. Body water is lost during exercise and must be replaced.  Talk to your health care provider before starting an exercise program to make sure it is safe for you. Remember, almost any type of activity is better than none. Document Released: 11/20/2003 Document Revised: 05/02/2013 Document Reviewed: 02/06/2013 [facility]  Patient Information 2014 [place], [place].  Trigger Finger Trigger finger (digital tendinitis and stenosing tenosynovitis) is a common disorder that causes an often painful catching of the fingers or thumb. It occurs as a clicking, snapping, or locking of a finger in the palm of the hand. This is caused by a problem with the tendons that flex or bend the fingers sliding smoothly through their sheaths. The condition may occur in any finger or a couple fingers at the same time.  The finger may lock with the finger curled or suddenly straighten out with a snap. This is more common in patients with rheumatoid arthritis and diabetes. Left untreated, the condition may get worse to the point where the finger becomes locked in flexion, like making a fist, or less commonly locked with the finger straightened out. CAUSES   Inflammation and scarring that lead to swelling around the tendon sheath.  Repeated or forceful movements.  Rheumatoid arthritis, an autoimmune disease that affects joints.  Gout.  Diabetes mellitus. SIGNS AND SYMPTOMS  Soreness and swelling of your finger.  A painful clicking or snapping as you bend and straighten your finger. DIAGNOSIS  Your health care provider will do a physical exam of your finger to diagnose trigger finger. TREATMENT   Splinting for 6 8 weeks may be helpful.  Nonsteroidal anti-inflammatory medicines (NSAIDs) can help to relieve the pain and inflammation.  Cortisone injections, along with splinting, may speed up recovery. Several injections may be required. Cortisone may give relief after one injection.  Surgery is another treatment that may be used if conservative treatments do not work. Surgery can be minor, without incisions (a cut does not have to be made), and can be done with a needle through the skin.  Other surgical choices involve an open procedure in which the surgeon opens the hand through a small incision and cuts the pulley so the tendon can  again slide smoothly. Your hand will still work fine. HOME CARE INSTRUCTIONS  Apply ice to the injured area, twice per day:  Put ice in a plastic bag.  Place a towel between your skin and the bag.  Leave the ice on for 20 minutes, 3 4 times a day.  Rest your hand often. MAKE SURE YOU:   Understand these instructions.  Will watch your condition.  Will get help right away if you are not doing well or get worse. Document Released: 06/19/2004 Document Revised: 05/02/2013 Document Reviewed: 01/30/2013 [facility] Patient Information 2014 [place], [place].

## 2013-10-04 NOTE — Progress Notes (Signed)
Subjective:    Patient ID: Sheila Ferguson, female    DOB: 1961-08-21, 53 y.o.   MRN: [id number]  HPI Comments: 53 y.o PMH HTN, obesity, HLD, anxiety/depression, GERD, DM 2 (HA1C 8.4-->11.9 today cbg 223) avg cbg 346 per meter and avg cbg 295 per our clinic; cbgs ranging 88 to near 600, HTN (BP 173/91>155/84 today).    She presents for follow up HTN, DM.  She is intermittently compliant with DM medications and is not taking cholesterol medication.  She is supposed to be taking NPH 22u am and 23 u pm, Metformin 500 qam and 1000 pm and Glipizide 10 wam.  She needs a referral to see Dr. Nile Riggs as he saw eye findings at last visit.  She c/o decreased vision though she has new glasses.  She does not wish to speak with Gavin Pound today  She c/o her b/l thumbs and b/l last fingers (pinky's) locking up which is painful.  She cant move these fingers when they lock up.  She is having trouble opening and closing lids/tops.  This has been a problem in the past.  She has seen Dr. Jennette Kettle diagnosed with DeQuervins tenosynovitis.    She c/o b/l knee pain R>L.  She states she has bad knees. Knees are throbbing and feels like a bad toothache.  Knees hurt worse at night. She takes Ibuprofen w/o relief.  She has seen ortho in the past about her knee pain.  She has not tried tylenol.    She reports a resolving sinus/throat URI.     SH: unemployed in school, orange card. She is no longer working out at [employer]    HM: She will get the flu shot today, she is due for colonoscopy but will do FOBT cards.  Will do foot exam today.  She is due for mammogram last in [facility] is in 2000 but per pt last colonoscopy was 2012     Review of Systems  Respiratory: Negative for shortness of breath.   Cardiovascular: Positive for leg swelling. Negative for chest pain.  Gastrointestinal: Positive for abdominal distention.  Musculoskeletal: Positive for arthralgias.       Objective:   Physical Exam  Nursing note and  vitals reviewed. Constitutional: She is oriented to person, place, and time. She appears well-developed and well-nourished. She is cooperative. No distress.  HENT:  Head: Normocephalic and atraumatic.  Mouth/Throat: Oropharynx is clear and moist and mucous membranes are normal. No oropharyngeal exudate.  Eyes: Conjunctivae are normal. Pupils are equal, round, and reactive to light. Right eye exhibits no discharge. Left eye exhibits no discharge. No scleral icterus.  Cardiovascular: Normal rate, regular rhythm, S1 normal, S2 normal and normal heart sounds.   No murmur heard. Pulses:      Dorsalis pedis pulses are 1+ on the right side, and 1+ on the left side.       Posterior tibial pulses are 1+ on the right side, and 1+ on the left side.  1 to 2+ lower ext edema   Pulmonary/Chest: Effort normal and breath sounds normal. No respiratory distress. She has no wheezes.  Abdominal: Soft. Bowel sounds are normal. There is no tenderness.  Obese abdomen  Neurological: She is alert and oriented to person, place, and time. Gait normal.  Skin: Skin is warm, dry and intact. No rash noted. She is not diaphoretic.  Psychiatric: She has a normal mood and affect. Her speech is normal and behavior is normal. Judgment and thought content normal.  Cognition and memory are normal.          Assessment & Plan:  F/u in 3 months  Will refer to sports medicine and eye MD Dr. Nile RiggsShapiro

## 2013-10-05 ENCOUNTER — Encounter: Payer: Self-pay | Admitting: Internal Medicine

## 2013-10-05 DIAGNOSIS — M170 Bilateral primary osteoarthritis of knee: Secondary | ICD-10-CM | POA: Insufficient documentation

## 2013-10-05 DIAGNOSIS — Z0000 Encounter for general adult medical examination without abnormal findings: Secondary | ICD-10-CM | POA: Insufficient documentation

## 2013-10-05 DIAGNOSIS — R600 Localized edema: Secondary | ICD-10-CM | POA: Insufficient documentation

## 2013-10-05 MED ORDER — LOVASTATIN 40 MG PO TABS: 40.0000 mg | ORAL_TABLET | Freq: Every day | ORAL | Status: DC

## 2013-10-05 NOTE — Assessment & Plan Note (Signed)
Now pt has possibly trigger finger of multiple fingers left hand thumb and pinky, right hand same Will refer to sports medicine for further care i.e steroid inj or surgical decompression as mentioned in the past Better control of DM will likely help trigger finger get better.

## 2013-10-05 NOTE — Assessment & Plan Note (Signed)
B/l she may benefit from diuretic tx in the future

## 2013-10-05 NOTE — Assessment & Plan Note (Signed)
Lab Results  Component Value Date   HGBA1C 11.9 10/04/2013   HGBA1C 8.4 03/15/2013   HGBA1C 9.4 12/25/2012     Assessment: Diabetes control: poor control (HgbA1C >9%) Progress toward A1C goal:  deteriorated Comments: HA1C 11.9   Plan: Medications:  continue current medications though increased NPH 22-->26 units qam and 23 to 28 units qpm.  Increased Metformin from 500, 1000 to 1000 bid.  Continue Glipizide 10 Home glucose monitoring: Frequency: 3 times a day Timing: before meals Instruction/counseling given: reminded to get eye exam, reminded to bring blood glucose meter & log to each visit, reminded to bring medications to each visit, discussed foot care, discussed diet and provided printed educational material Educational resources provided: brochure;handout Self management tools provided: copy of home glucose meter download Other plans: discussed exercise to lose wt, diet modification.  Will refer to Dr. Nile RiggsShapiro to f/u eye exam.  See med changes above.  F/u in 3 months.  Pt did not want to meet with Gavin Poundonna Plyer

## 2013-10-05 NOTE — Assessment & Plan Note (Signed)
BP Readings from Last 3 Encounters:  10/04/13 155/84  03/15/13 149/83  03/01/13 192/88    Lab Results  Component Value Date   NA 138 10/04/2013   K 4.0 10/04/2013   CREATININE 1.07 10/04/2013    Assessment: Blood pressure control: moderately elevated Progress toward BP goal:  deteriorated Comments: none  Plan: Medications:  continue current medications Educational resources provided: brochure;handout Self management tools provided: home blood pressure logbook Other plans: BMET, lipid panel today

## 2013-10-05 NOTE — Addendum Note (Signed)
Addended by: Annett GulaMCLEAN, TRACY N on: 10/05/2013 09:16 AM   Modules accepted: Orders

## 2013-10-05 NOTE — Progress Notes (Signed)
Case discussed with Dr. McLean soon after the resident saw the patient.  We reviewed the resident's history and exam and pertinent patient test results.  I agree with the assessment, diagnosis, and plan of care documented in the resident's note. 

## 2013-10-05 NOTE — Assessment & Plan Note (Signed)
She will get the flu shot today, she is due for colonoscopy but will do FOBT cards.  Will do foot exam today.  She is due for mammogram last in [facility] is in 2000 but per pt last colonoscopy was 2012 Will refer to Dr. Nile RiggsShapiro today

## 2013-10-05 NOTE — Assessment & Plan Note (Signed)
R>L likely degenerative  disc'ed wt loss  Will refer to Sports Medicine. Previously seen ortho Try tylenol in the meantime Consider imaging or getting records from previous ortho office (pt not sure the name)

## 2013-10-05 NOTE — Assessment & Plan Note (Signed)
Lipid Panel     Component Value Date/Time   CHOL 202* 10/04/2013 1620   TRIG 189* 10/04/2013 1620   HDL 53 10/04/2013 1620   CHOLHDL 3.8 10/04/2013 1620   VLDL 38 10/04/2013 1620   LDLCALC 111* 10/04/2013 1620   Pt not taking statin Will rx refill today  LDL not at goal <100

## 2013-10-10 ENCOUNTER — Encounter: Payer: Self-pay | Admitting: *Deleted

## 2013-10-29 ENCOUNTER — Ambulatory Visit: Payer: No Typology Code available for payment source | Admitting: Family Medicine

## 2013-10-31 ENCOUNTER — Ambulatory Visit: Payer: No Typology Code available for payment source | Admitting: Family Medicine

## 2013-11-09 ENCOUNTER — Ambulatory Visit (HOSPITAL_COMMUNITY)
Admission: RE | Admit: 2013-11-09 | Discharge: 2013-11-09 | Disposition: A | Discharge status: Home / Self Care | Payer: No Typology Code available for payment source | Source: Ambulatory Visit | Attending: Internal Medicine | Admitting: Internal Medicine

## 2013-11-09 DIAGNOSIS — Z1231 Encounter for screening mammogram for malignant neoplasm of breast: Secondary | ICD-10-CM | POA: Insufficient documentation

## 2013-11-09 DIAGNOSIS — Z0000 Encounter for general adult medical examination without abnormal findings: Secondary | ICD-10-CM

## 2013-11-12 ENCOUNTER — Encounter: Payer: Self-pay | Admitting: Family Medicine

## 2013-11-12 ENCOUNTER — Ambulatory Visit (INDEPENDENT_AMBULATORY_CARE_PROVIDER_SITE_OTHER): Payer: No Typology Code available for payment source | Admitting: Family Medicine

## 2013-11-12 VITALS — BP 137/86 | Ht 64.0 in | Wt 310.0 lb

## 2013-11-12 DIAGNOSIS — M65311 Trigger thumb, right thumb: Secondary | ICD-10-CM

## 2013-11-12 DIAGNOSIS — M6530 Trigger finger, unspecified finger: Secondary | ICD-10-CM

## 2013-11-12 DIAGNOSIS — M65312 Trigger thumb, left thumb: Secondary | ICD-10-CM

## 2013-11-13 DIAGNOSIS — M65312 Trigger thumb, left thumb: Principal | ICD-10-CM

## 2013-11-13 DIAGNOSIS — M65311 Trigger thumb, right thumb: Secondary | ICD-10-CM | POA: Insufficient documentation

## 2013-11-13 NOTE — Progress Notes (Signed)
   Subjective:    Patient ID: Sheila Ferguson, female    DOB: 10-05-1960, 53 y.o.   MRN: [id number]  HPI Complaint of bilateral thumb pain and locking. Right worse than left. Right-hand dominant. This is been a problem for greater than a year. Many months ago she had injection the left thumb which did not seem to improve matters. She has difficulty opening jars, difficulty writing, differential difficulty grabbing door handles.   Review of Systems Pertinent review of systems: negative for fever or unusual weight change.      Objective:   Physical Exam  Vital signs are reviewed GENERAL: Well-developed overweight female no acute distress HANDS bilaterally are symmetrical. Each thumb locks in the flexed position. She has tenderness at the A1 pulley of the right thumb. Distally she has a resting tach status. No thenar atrophy.  INJECTION: Patient was given informed consent, signed copy in the chart. Appropriate time out was taken. Area prepped and draped in usual sterile fashion. One cc of methylprednisolone 40 mg/ml plus  one half cc of 1% lidocaine without epinephrine was injected into the A1 pulley area of the right bone using a(n) palmar approach. The patient tolerated the procedure well. There were no complications. Post procedure instructions were given.       Assessment & Plan:  Bilateral trigger thumbs. He tried injection the right thumb today see if that will improve or if not then I think she should proceed to orthopedic surgery

## 2013-12-10 ENCOUNTER — Ambulatory Visit (INDEPENDENT_AMBULATORY_CARE_PROVIDER_SITE_OTHER): Payer: No Typology Code available for payment source | Admitting: Family Medicine

## 2013-12-10 ENCOUNTER — Encounter: Payer: Self-pay | Admitting: Family Medicine

## 2013-12-10 VITALS — BP 165/92 | HR 73 | Ht 64.0 in | Wt 310.0 lb

## 2013-12-10 DIAGNOSIS — M65312 Trigger thumb, left thumb: Principal | ICD-10-CM

## 2013-12-10 DIAGNOSIS — M65311 Trigger thumb, right thumb: Secondary | ICD-10-CM

## 2013-12-10 DIAGNOSIS — M6530 Trigger finger, unspecified finger: Secondary | ICD-10-CM

## 2013-12-10 NOTE — Assessment & Plan Note (Signed)
Improved s/p injection of R thumb several weeks back. Pt prefers to hold off on L thumb injection for now. Advised that if her thumb gets to where she cannot flex it out of its stuck position without using help from her other hand, that she should come in ASAP for another injection. Pt understood these instructions.

## 2013-12-10 NOTE — Progress Notes (Signed)
   HPI:  F/u bilateral trigger thumbs: pt had injection of R thumb several weeks back. That thumb is much better. Her left thumb still catches sometimes but she feels she can live with it. Does not want another injection today. She is able to still bend and straighten the L thumb despite it catching some.   ROS: See HPI  PMFSH: DM, HTN, GERD, bilateral trigger thumbs  PHYSICAL EXAM: BP 165/92  Pulse 73  Ht 5\' 4"  (1.626 m)  Wt 310 lb (140.615 kg)  BMI 53.19 kg/m2 Gen: NAD, pleasant, cooperative MSK: bilateral hands with full strength. No joint effusion, erythema, or edema. R thumb with full ROM and no catching. L thumb with some catching but able to complete full flexion & extension.   ASSESSMENT/PLAN:  See problem based charting for assessment/plan.  FOLLOW UP: F/u as needed for bilateral trigger thumbs  SIGNED: GrenadaBrittany J. Pollie MeyerMcIntyre, MD Family Medicine Resident PGY-2 [facility]

## 2013-12-12 NOTE — Progress Notes (Signed)
[facility] Attending Note: I have seen and examined this patient. I have discussed this patient with the resident and reviewed the assessment and plan as documented above. I agree with the resident's findings and plan.  

## 2013-12-26 ENCOUNTER — Telehealth: Payer: Self-pay | Admitting: *Deleted

## 2013-12-26 NOTE — Telephone Encounter (Signed)
Actual walk-in, pt c/[facility]al [facility]. She has not taken inderal for appr 2 weeks, she has started having h/a's. She would like an appt w/ dr Shirlee Latchmclean. Scheduled 4/16 at 0915

## 2013-12-27 ENCOUNTER — Ambulatory Visit (INDEPENDENT_AMBULATORY_CARE_PROVIDER_SITE_OTHER): Payer: No Typology Code available for payment source | Admitting: Internal Medicine

## 2013-12-27 ENCOUNTER — Encounter: Payer: Self-pay | Admitting: Internal Medicine

## 2013-12-27 ENCOUNTER — Ambulatory Visit: Payer: No Typology Code available for payment source | Admitting: Internal Medicine

## 2013-12-27 VITALS — BP 140/90 | HR 94 | Temp 98.0°F | Ht 64.0 in | Wt 309.9 lb

## 2013-12-27 DIAGNOSIS — I10 Essential (primary) hypertension: Secondary | ICD-10-CM

## 2013-12-27 DIAGNOSIS — E1165 Type 2 diabetes mellitus with hyperglycemia: Secondary | ICD-10-CM

## 2013-12-27 DIAGNOSIS — E785 Hyperlipidemia, unspecified: Secondary | ICD-10-CM

## 2013-12-27 DIAGNOSIS — IMO0001 Reserved for inherently not codable concepts without codable children: Secondary | ICD-10-CM

## 2013-12-27 DIAGNOSIS — G43909 Migraine, unspecified, not intractable, without status migrainosus: Secondary | ICD-10-CM

## 2013-12-27 DIAGNOSIS — R1310 Dysphagia, unspecified: Secondary | ICD-10-CM | POA: Insufficient documentation

## 2013-12-27 LAB — POCT GLYCOSYLATED HEMOGLOBIN (HGB A1C): Hemoglobin A1C: 10.1

## 2013-12-27 LAB — GLUCOSE, CAPILLARY: Glucose-Capillary: 236 mg/dL — ABNORMAL HIGH (ref 70–99)

## 2013-12-27 MED ORDER — METOPROLOL TARTRATE 50 MG PO TABS: 50.0000 mg | ORAL_TABLET | Freq: Two times a day (BID) | ORAL | Status: DC

## 2013-12-27 MED ORDER — LOVASTATIN 20 MG PO TABS: 20.0000 mg | ORAL_TABLET | Freq: Every day | ORAL | Status: DC

## 2013-12-27 MED ORDER — GLIPIZIDE 10 MG PO TABS: 10.0000 mg | ORAL_TABLET | Freq: Two times a day (BID) | ORAL | Status: DC

## 2013-12-27 MED ORDER — GLIPIZIDE 10 MG PO TABS: 10.0000 mg | ORAL_TABLET | Freq: Every day | ORAL | Status: DC

## 2013-12-27 NOTE — Progress Notes (Signed)
   Subjective:    Patient ID: Sheila Ferguson, female    DOB: 1961/05/20, 53 y.o.   MRN: [id number]  HPI Comments: [age] y.o 53 y.o PMH HTN (BP 140/90 today), obesity, HLD, anxiety/depression, GERD, DM 2 (HA1C 11.9 09/2013, 10.1 HA1C today cbg is 236 today), h/o migraines, h/o leg edema, kidney stones, trigger fingers b/l thumbs.   She presents for: 1) Migraine f/u. She is unable [facility] and she has not taken it for 2 weeks and having more h/a's daily. H/a today is 2/10 location is normally and location is temples b/l and behind her eyes. Tylenol is not helping.  She denies insomnia but does report skipping meals at times.   2) DM 2-due for HA1C 10.1 today with cbg 236. Taking Metformin 1000 mg qd (higher doses caused anal fissure per pt)  3) HLD-not taking statin since 2014      Review of Systems  HENT: Positive for trouble swallowing.   Respiratory: Negative for shortness of breath.   Cardiovascular: Negative for chest pain.  Gastrointestinal: Negative for nausea, vomiting, abdominal pain, diarrhea and constipation.  Genitourinary: Negative for dysuria.       Objective:   Physical Exam  Nursing note and vitals reviewed. Constitutional: She is oriented to person, place, and time. Vital signs are normal. She appears well-developed and well-nourished. She is cooperative. No distress.  HENT:  Head: Normocephalic and atraumatic.  Mouth/Throat: Oropharynx is clear and moist and mucous membranes are normal. No oropharyngeal exudate.  Eyes: Conjunctivae are normal. Pupils are equal, round, and reactive to light. Right eye exhibits no discharge. Left eye exhibits no discharge. No scleral icterus.  Cardiovascular: Normal rate, regular rhythm, S1 normal, S2 normal and normal heart sounds.   No murmur heard. No lower ext edema   Pulmonary/Chest: Effort normal and breath sounds normal. No respiratory distress. She has no wheezes.  Abdominal: Soft.  Bowel sounds are normal. There is no tenderness.  Obese   Neurological: She is alert and oriented to person, place, and time. Gait normal.  CN 2-12 grossly intact  Skin: Skin is warm, dry and intact. No rash noted. She is not diaphoretic.  Psychiatric: She has a normal mood and affect. Her speech is normal and behavior is normal. Judgment and thought content normal. Cognition and memory are normal.          Assessment & Plan:  F/u in 1 month prn h/a, otherwise 3 months

## 2013-12-27 NOTE — Assessment & Plan Note (Signed)
Patient has always had dysphagia and h/o dilation  Will refer back to [facility]

## 2013-12-27 NOTE — Assessment & Plan Note (Addendum)
Lab Results  Component Value Date   HGBA1C 10.1 12/27/2013   HGBA1C 11.9 10/04/2013   HGBA1C 8.4 03/15/2013     Assessment: Diabetes control: poor control (HgbA1C >9%) Progress toward A1C goal:   (pending HA1C) Comments: HA1C improved to 10.1 today. Meter readings show pt is checking cbgs infreq and ranging 150s-300s)  Plan: Medications:  continue current medications (Metformin 1000 mg daily, increased Glipizide from 10 mg qd to 10 mg bid) Home glucose monitoring: Frequency: 3 times a day Timing: before meals Instruction/counseling given: reminded to bring blood glucose meter & log to each visit, reminded to bring medications to each visit and provided printed educational material, disc'ed exercise  Educational resources provided: brochure;other (see comments) Self management tools provided: copy of home glucose meter download;home glucose logbook;other (see comments) Other plans: f/u in 3 months

## 2013-12-27 NOTE — Assessment & Plan Note (Signed)
H/a's uncontrolled since stopping Propranolol 2 weeks ago due to cost Will change to Metoprolol tartrate 50 mg bid Disc'ed not skipping meals, given info on sleep hygiene  F/u in 1 mo if not improved

## 2013-12-27 NOTE — Patient Instructions (Addendum)
General Instructions: Please follow up in 1 month if not improved with h/a otherwise 3 months for diabetes  Read all the info below  Take Glipizide 10 mg 2x per day before meals (dont skip) and Metformin 500 mg 2x per day  TAke Metoprol 1000 mg 1x per day for h/a prevention Follow up with GI   Treatment Goals:  Goals (1 Years of Data) as of 12/27/13         As of Today 12/10/13 11/12/13 10/04/13 10/04/13     Blood Pressure    . Blood Pressure < 140/90  140/90 165/92 137/86 155/84 173/91     Result Component    . HEMOGLOBIN A1C < 7.0     11.9     . LDL CALC < 100     111       Progress Toward Treatment Goals:  Treatment Goal 12/27/2013  Hemoglobin A1C (No Data)  Blood pressure at goal  Prevent falls unable to assess    Self Care Goals & Plans:  Self Care Goal 12/27/2013  Manage my medications take my medicines as prescribed; bring my medications to every visit; refill my medications on time  Monitor my health keep track of my blood glucose; bring my glucose meter and log to each visit; keep track of my blood pressure; bring my blood pressure log to each visit; keep track of my weight; check my feet daily  Eat healthy foods drink diet soda or water instead of juice or soda; eat more vegetables; eat foods that are low in salt; eat baked foods instead of fried foods; eat fruit for snacks and desserts; eat smaller portions  Be physically active find an activity I enjoy  Meeting treatment goals maintain the current self-care plan    Home Blood Glucose Monitoring 12/27/2013  Check my blood sugar 3 times a day  When to check my blood sugar before meals     Care Management & Community Referrals:  Referral 12/27/2013  Referrals made for care management support none needed  Referrals made to community resources none       Migraine Headache A migraine headache is an intense, throbbing pain on one or both sides of your head. A migraine can last for 30 minutes to several  hours. CAUSES  The exact cause of a migraine headache is not always known. However, a migraine may be caused when nerves in the brain become irritated and release chemicals that cause inflammation. This causes pain. Certain things may also trigger migraines, such as:  Alcohol.  Smoking.  Stress.  Menstruation.  Aged cheeses.  Foods or drinks that contain nitrates, glutamate, aspartame, or tyramine.  Lack of sleep.  Chocolate.  Caffeine.  Hunger.  Physical exertion.  Fatigue.  Medicines used to treat chest pain (nitroglycerine), birth control pills, estrogen, and some blood pressure medicines. SIGNS AND SYMPTOMS  Pain on one or both sides of your head.  Pulsating or throbbing pain.  Severe pain that prevents daily activities.  Pain that is aggravated by any physical activity.  Nausea, vomiting, or both.  Dizziness.  Pain with exposure to bright lights, loud noises, or activity.  General sensitivity to bright lights, loud noises, or smells. Before you get a migraine, you may get warning signs that a migraine is coming (aura). An aura may include:  Seeing flashing lights.  Seeing bright spots, halos, or zig-zag lines.  Having tunnel vision or blurred vision.  Having feelings of numbness or tingling.  Having trouble talking.  Having muscle weakness. DIAGNOSIS  A migraine headache is often diagnosed based on:  Symptoms.  Physical exam.  A CT scan or MRI of your head. These imaging tests cannot diagnose migraines, but they can help rule out other causes of headaches. TREATMENT Medicines may be given for pain and nausea. Medicines can also be given to help prevent recurrent migraines.  HOME CARE INSTRUCTIONS  Only take over-the-counter or prescription medicines for pain or discomfort as directed by your health care provider. The use of long-term narcotics is not recommended.  Lie down in a dark, quiet room when you have a migraine.  Keep a journal  to find out what may trigger your migraine headaches. For example, write down:  What you eat and drink.  How much sleep you get.  Any change to your diet or medicines.  Limit alcohol consumption.  Quit smoking if you smoke.  Get 7 9 hours of sleep, or as recommended by your health care provider.  Limit stress.  Keep lights dim if bright lights bother you and make your migraines worse. SEEK IMMEDIATE MEDICAL CARE IF:   Your migraine becomes severe.  You have a fever.  You have a stiff neck.  You have vision loss.  You have muscular weakness or loss of muscle control.  You start losing your balance or have trouble walking.  You feel faint or pass out.  You have severe symptoms that are different from your first symptoms. MAKE SURE YOU:   Understand these instructions.  Will watch your condition.  Will get help right away if you are not doing well or get worse. Document Released: 08/30/2005 Document Revised: 06/20/2013 Document Reviewed: 05/07/2013 [facility] Patient Information 2014 [place], [place].  Exercise to Lose Weight Exercise and a healthy diet may help you lose weight. Your doctor may suggest specific exercises. EXERCISE IDEAS AND TIPS  Choose low-cost things you enjoy doing, such as walking, bicycling, or exercising to workout videos.  Take stairs instead of the elevator.  Walk during your lunch break.  Park your car further away from work or school.  Go to a gym or an exercise class.  Start with 5 to 10 minutes of exercise each day. Build up to 30 minutes of exercise 4 to 6 days a week.  Wear shoes with good support and comfortable clothes.  Stretch before and after working out.  Work out until you breathe harder and your heart beats faster.  Drink extra water when you exercise.  Do not do so much that you hurt yourself, feel dizzy, or get very short of breath. Exercises that burn about 150 calories:  Running 1  miles in 15  minutes.  Playing volleyball for 45 to 60 minutes.  Washing and waxing a car for 45 to 60 minutes.  Playing touch football for 45 minutes.  Walking 1  miles in 35 minutes.  Pushing a stroller 1  miles in 30 minutes.  Playing basketball for 30 minutes.  Raking leaves for 30 minutes.  Bicycling 5 miles in 30 minutes.  Walking 2 miles in 30 minutes.  Dancing for 30 minutes.  Shoveling snow for 15 minutes.  Swimming laps for 20 minutes.  Walking up stairs for 15 minutes.  Bicycling 4 miles in 15 minutes.  Gardening for 30 to 45 minutes.  Jumping rope for 15 minutes.  Washing windows or floors for 45 to 60 minutes. Document Released: 10/02/2010 Document Revised: 11/22/2011 Document Reviewed: 10/02/2010 [facility] Patient Information 2014 [place], [place].  Fall  Prevention and Home Safety Falls cause injuries and can affect all age groups. It is possible to prevent falls.  HOW TO PREVENT FALLS  Wear shoes with rubber soles that do not have an opening for your toes.  Keep the inside and outside of your house well lit.  Use night lights throughout your home.  Remove clutter from floors.  Clean up floor spills.  Remove throw rugs or fasten them to the floor with carpet tape.  Do not place electrical cords across pathways.  Put grab bars by your tub, shower, and toilet. Do not use towel bars as grab bars.  Put handrails on both sides of the stairway. Fix loose handrails.  Do not climb on stools or stepladders, if possible.  Do not wax your floors.  Repair uneven or unsafe sidewalks, walkways, or stairs.  Keep items you use a lot within reach.  Be aware of pets.  Keep emergency numbers next to the telephone.  Put smoke detectors in your home and near bedrooms. Ask your doctor what other things you can do to prevent falls. Document Released: 06/26/2009 Document Revised: 02/29/2012 Document Reviewed: 11/30/2011 [facility] Patient Information 2014 [place]  DASH Diet The DASH diet stands for "Dietary Approaches to Stop Hypertension." It is a healthy eating plan that has been shown to reduce high blood pressure (hypertension) in as little as 14 days, while also possibly providing other significant health benefits. These other health benefits include reducing the risk of breast cancer after menopause and reducing the risk of type 2 diabetes, heart disease, colon cancer, and stroke. Health benefits also include weight loss and slowing kidney failure in patients with chronic kidney disease.  DIET GUIDELINES  Limit salt (sodium). Your diet should contain less than 1500 mg of sodium daily.  Limit refined or processed carbohydrates. Your diet should include mostly whole grains. Desserts and added sugars should be used sparingly.  Include small amounts of heart-healthy fats. These types of fats include nuts, oils, and tub margarine. Limit saturated and trans fats. These fats have been shown to be harmful in the body. CHOOSING FOODS  The following food groups are based on a 2000 calorie diet. See your Registered Dietitian for individual calorie needs. Grains and Grain Products (6 to 8 servings daily)  Eat More Often: Whole-wheat bread, brown rice, whole-grain or wheat pasta, quinoa, popcorn without added fat or salt (air popped).  Eat Less Often: White bread, white pasta, white rice, cornbread. Vegetables (4 to 5 servings daily)  Eat More Often: Fresh, frozen, and canned vegetables. Vegetables may be raw, steamed, roasted, or grilled with a minimal amount of fat.  Eat Less Often/Avoid: Creamed or fried vegetables. Vegetables in a cheese sauce. Fruit (4 to 5 servings daily)  Eat More Often: All fresh, canned (in natural juice), or frozen fruits. Dried fruits without added sugar. One hundred percent fruit juice ( cup [237 mL] daily).  Eat Less Often: Dried fruits with added sugar. Canned fruit in light or heavy syrup. Foot LockerLean Meats, Fish, and  Poultry (2 servings or less daily. One serving is 3 to 4 oz [85-114 g]).  Eat More Often: Ninety percent or leaner ground beef, tenderloin, sirloin. Round cuts of beef, chicken breast, [place]key breast. All fish. Grill, bake, or broil your meat. Nothing should be fried.  Eat Less Often/Avoid: Fatty cuts of meat, [place]key, or chicken leg, thigh, or wing. Fried cuts of meat or fish. Dairy (2 to 3 servings)  Eat More Often: Low-fat or  fat-free milk, low-fat plain or light yogurt, reduced-fat or part-skim cheese.  Eat Less Often/Avoid: Milk (whole, 2%).Whole milk yogurt. Full-fat cheeses. Nuts, Seeds, and Legumes (4 to 5 servings per week)  Eat More Often: All without added salt.  Eat Less Often/Avoid: Salted nuts and seeds, canned beans with added salt. Fats and Sweets (limited)  Eat More Often: Vegetable oils, tub margarines without trans fats, sugar-free gelatin. Mayonnaise and salad dressings.  Eat Less Often/Avoid: Coconut oils, palm oils, butter, stick margarine, cream, half and half, cookies, candy, pie. FOR MORE INFORMATION The Dash Diet Eating Plan: www.dashdiet.org Document Released: 08/19/2011 Document Revised: 11/22/2011 Document Reviewed: 08/19/2011 [facility] Patient Information 2014 [place], [place].  Hypertension As your heart beats, it forces blood through your arteries. This force is your blood pressure. If the pressure is too high, it is called hypertension (HTN) or high blood pressure. HTN is dangerous because you may have it and not know it. High blood pressure may mean that your heart has to work harder to pump blood. Your arteries may be narrow or stiff. The extra work puts you at risk for heart disease, stroke, and other problems.  Blood pressure consists of two numbers, a higher number over a lower, 110/72, for example. It is stated as "110 over 72." The ideal is below 120 for the top number (systolic) and under 80 for the bottom (diastolic). Write down your blood pressure  today. You should pay close attention to your blood pressure if you have certain conditions such as:  Heart failure.  Prior heart attack.  Diabetes  Chronic kidney disease.  Prior stroke.  Multiple risk factors for heart disease. To see if you have HTN, your blood pressure should be measured while you are seated with your arm held at the level of the heart. It should be measured at least twice. A one-time elevated blood pressure reading (especially in the Emergency Department) does not mean that you need treatment. There may be conditions in which the blood pressure is different between your right and left arms. It is important to see your caregiver soon for a recheck. Most people have essential hypertension which means that there is not a specific cause. This type of high blood pressure may be lowered by changing lifestyle factors such as:  Stress.  Smoking.  Lack of exercise.  Excessive weight.  Drug/tobacco/alcohol use.  Eating less salt. Most people do not have symptoms from high blood pressure until it has caused damage to the body. Effective treatment can often prevent, delay or reduce that damage. TREATMENT  When a cause has been identified, treatment for high blood pressure is directed at the cause. There are a large number of medications to treat HTN. These fall into several categories, and your caregiver will help you select the medicines that are best for you. Medications may have side effects. You should review side effects with your caregiver. If your blood pressure stays high after you have made lifestyle changes or started on medicines,   Your medication(s) may need to be changed.  Other problems may need to be addressed.  Be certain you understand your prescriptions, and know how and when to take your medicine.  Be sure to follow up with your caregiver within the time frame advised (usually within two weeks) to have your blood pressure rechecked and to review  your medications.  If you are taking more than one medicine to lower your blood pressure, make sure you know how and at what times they  should be taken. Taking two medicines at the same time can result in blood pressure that is too low. SEEK IMMEDIATE MEDICAL CARE IF:  You develop a severe headache, blurred or changing vision, or confusion.  You have unusual weakness or numbness, or a faint feeling.  You have severe chest or abdominal pain, vomiting, or breathing problems. MAKE SURE YOU:   Understand these instructions.  Will watch your condition.  Will get help right away if you are not doing well or get worse. Document Released: 08/30/2005 Document Revised: 11/22/2011 Document Reviewed: 04/19/2008 [facility] Patient Information 2014 [place], [place].  Type 2 Diabetes Mellitus, Adult Type 2 diabetes mellitus is a long-term (chronic) disease. In type 2 diabetes:  The pancreas does not make enough of a hormone called insulin.  The cells in the body do not respond as well to the insulin that is made.  Both of the above can happen. Normally, insulin moves sugars from food into tissue cells. This gives you energy. If you have type 2 diabetes, sugars cannot be moved into tissue cells. This causes high blood sugar (hyperglycemia).  HOME CARE  Have your hemoglobin A1c level checked twice a year. The level shows if your diabetes is under control or out of control.  Perform daily blood sugar testing as told by your doctor.  Check your ketone levels by testing your pee (urine) when you are sick and as told.  Take your diabetes or insulin medicine as told by your doctor.  Never run out of insulin.  Adjust how much insulin you give yourself based on how many carbs (carbohydrates) you eat. Carbs are in many foods, such as fruits, vegetables, whole grains, and dairy products.  Have a healthy snack between every healthy meal. Have 3 meals and 3 snacks a day.  Lose weight if you are  overweight.  Carry a medical alert card or wear your medical alert jewelry.  Carry a 15 gram carb snack with you at all times. Examples include:  Glucose pills, 3 or 4.  Glucose gel, 15 gram tube.  Raisins, 2 tablespoons (24 grams).  Jelly beans, 6.  Animal crackers, 8.  Sugar pop, 4 ounces (120 milliliters).  Gummy treats, 9.  Notice low blood sugar (hypoglycemia) symptoms, such as:  Shaking (tremors).  Decreased ability to think clearly.  Sweating.  Increased heart rate.  Headache.  Dry mouth.  Hunger.  Crabbiness (irritability).  Being worried or tense (anxiety).  Restless sleep.  A change in speech or coordination.  Confusion.  Treat low blood sugar right away. If you are alert and can swallow, follow the 15:15 rule:  Take 15 20 grams of a rapid-acting glucose or carb. This includes glucose gel, glucose pills, or 4 ounces (120 milliliters) of fruit juice, regular pop, or low-fat milk.  Check your blood sugar level after taking the glucose.  Take 15 20 grams of more glucose if the repeat blood sugar level is still 70 mg/dL (milligrams/deciliter) or below.  Eat a meal or snack within 1 hour of the blood sugar levels going back to normal.  Notice early symptoms of high blood sugar, such as:  Being really thirsty or drinking a lot (polydipsia).  Peeing (urinating) a lot (polyuria).  Do at least 150 minutes of physical activity a week or as told.  Split the 150 minutes of activity up during the week. Do not do 150 minutes of activity in one day.  Perform exercises, such as weight lifting, at least 2 times a  week or as told.  Adjust your insulin or food intake as needed if you start a new exercise or sport.  Follow your sick day plan when you are not able to eat or drink as usual.  Avoid tobacco use.  Women who are not pregnant should drink no more than 1 drink a day. Men should drink no more than 2 drinks a day.  Only drink alcohol with  food.  Ask your doctor if alcohol is safe for you.  Tell your doctor if you drink alcohol several times during the week.  See your doctor regularly.  Schedule an eye exam soon after you are diagnosed with diabetes. Schedule exams once every year.  Check your skin and feet every day. Check for cuts, bruises, redness, nail problems, bleeding, blisters, or sores. A doctor should do a foot exam once a year.  Brush your teeth and gums twice a day. Floss once a day. Visit your dentist regularly.  Share your diabetes plan with your workplace or school.  Stay up-to-date with shots that fight against diseases (immunizations).  Learn how to manage stress.  Get diabetes education and support as needed.  Ask your doctor for special help if:  You need help to maintain or improve how you to do things on your own.  You need help to maintain or improve the quality of your life.  You have foot or hand problems.  You have trouble cleaning yourself, dressing, eating, or doing physical activity. GET HELP RIGHT AWAY IF:  You have trouble breathing.  You have moderate to large ketone levels.  You are unable to eat food or drink fluids for more than 6 hours.  You feel sick to your stomach (nauseous) or throw up (vomit) for more than 6 hours.  Your blood sugar level is over 240 mg/dL.  There is a change in mental status.  You get another serious illness.  You have watery poop (diarrhea) for more than 6 hours.  You have been sick or have had a fever for 2 or more days and are not getting better.  You have pain when you are physically active. MAKE SURE YOU:  Understand these instructions.  Will watch your condition.  Will get help right away if you are not doing well or get worse. Document Released: 06/08/2008 Document Revised: 06/20/2013 Document Reviewed: 12/29/2012 [facility] Patient Information 2014 [place], [place].  Insomnia Insomnia is frequent trouble falling and/or staying  asleep. Insomnia can be a long term problem or a short term problem. Both are common. Insomnia can be a short term problem when the wakefulness is related to a certain stress or worry. Long term insomnia is often related to ongoing stress during waking hours and/or poor sleeping habits. Overtime, sleep deprivation itself can make the problem worse. Every little thing feels more severe because you are overtired and your ability to cope is decreased. CAUSES   Stress, anxiety, and depression.  Poor sleeping habits.  Distractions such as TV in the bedroom.  Naps close to bedtime.  Engaging in emotionally charged conversations before bed.  Technical reading before sleep.  Alcohol and other sedatives. They may make the problem worse. They can hurt normal sleep patterns and normal dream activity.  Stimulants such as caffeine for several hours prior to bedtime.  Pain syndromes and shortness of breath can cause insomnia.  Exercise late at night.  Changing time zones may cause sleeping problems (jet lag). It is sometimes helpful to have someone observe your sleeping  patterns. They should look for periods of not breathing during the night (sleep apnea). They should also look to see how long those periods last. If you live alone or observers are uncertain, you can also be observed at a sleep clinic where your sleep patterns will be professionally monitored. Sleep apnea requires a checkup and treatment. Give your caregivers your medical history. Give your caregivers observations your family has made about your sleep.  SYMPTOMS   Not feeling rested in the morning.  Anxiety and restlessness at bedtime.  Difficulty falling and staying asleep. TREATMENT   Your caregiver may prescribe treatment for an underlying medical disorders. Your caregiver can give advice or help if you are using alcohol or other drugs for self-medication. Treatment of underlying problems will usually eliminate insomnia  problems.  Medications can be prescribed for short time use. They are generally not recommended for lengthy use.  Over-the-counter sleep medicines are not recommended for lengthy use. They can be habit forming.  You can promote easier sleeping by making lifestyle changes such as:  Using relaxation techniques that help with breathing and reduce muscle tension.  Exercising earlier in the day.  Changing your diet and the time of your last meal. No night time snacks.  Establish a regular time to go to bed.  Counseling can help with stressful problems and worry.  Soothing music and white noise may be helpful if there are background noises you cannot remove.  Stop tedious detailed work at least one hour before bedtime. HOME CARE INSTRUCTIONS   Keep a diary. Inform your caregiver about your progress. This includes any medication side effects. See your caregiver regularly. Take note of:  Times when you are asleep.  Times when you are awake during the night.  The quality of your sleep.  How you feel the next day. This information will help your caregiver care for you.  Get out of bed if you are still awake after 15 minutes. Read or do some quiet activity. Keep the lights down. Wait until you feel sleepy and go back to bed.  Keep regular sleeping and waking hours. Avoid naps.  Exercise regularly.  Avoid distractions at bedtime. Distractions include watching television or engaging in any intense or detailed activity like attempting to balance the household checkbook.  Develop a bedtime ritual. Keep a familiar routine of bathing, brushing your teeth, climbing into bed at the same time each night, listening to soothing music. Routines increase the success of falling to sleep faster.  Use relaxation techniques. This can be using breathing and muscle tension release routines. It can also include visualizing peaceful scenes. You can also help control troubling or intruding thoughts by  keeping your mind occupied with boring or repetitive thoughts like the old concept of counting sheep. You can make it more creative like imagining planting one beautiful flower after another in your backyard garden.  During your day, work to eliminate stress. When this is not possible use some of the previous suggestions to help reduce the anxiety that accompanies stressful situations. MAKE SURE YOU:   Understand these instructions.  Will watch your condition.  Will get help right away if you are not doing well or get worse. Document Released: 08/27/2000 Document Revised: 11/22/2011 Document Reviewed: 09/27/2007 [facility] Patient Information 2014 [place], [place].   Metoprolol tablets What is this medicine? METOPROLOL (me TOE proe lole) is a beta-blocker. Beta-blockers reduce the workload on the heart and help it to beat more regularly. This medicine is used to  treat high blood pressure and to prevent chest pain. It is also used to after a heart attack and to prevent an additional heart attack from occurring. This medicine may be used for other purposes; ask your health care provider or pharmacist if you have questions. COMMON BRAND NAME(S): Lopressor What should I tell my health care provider before I take this medicine? They need to know if you have any of these conditions: -diabetes -heart or vessel disease like slow heart rate, worsening heart failure, heart block, sick sinus syndrome or Raynaud's disease -kidney disease -liver disease -lung or breathing disease, like asthma or emphysema -pheochromocytoma -thyroid disease -an unusual or allergic reaction to metoprolol, other beta-blockers, medicines, foods, dyes, or preservatives -pregnant or trying to get pregnant -breast-feeding How should I use this medicine? Take this medicine by mouth with a drink of water. Follow the directions on the prescription label. Take this medicine immediately after meals. Take your doses at regular  intervals. Do not take more medicine than directed. Do not stop taking this medicine suddenly. This could lead to serious heart-related effects. Talk to your pediatrician regarding the use of this medicine in children. Special care may be needed. Overdosage: If you think you have taken too much of this medicine contact a poison control center or emergency room at once. NOTE: This medicine is only for you. Do not share this medicine with others. What if I miss a dose? If you miss a dose, take it as soon as you can. If it is almost time for your next dose, take only that dose. Do not take double or extra doses. What may interact with this medicine? This medicine may interact with the following medications: -certain medicines for blood pressure, heart disease, irregular heart beat -certain medicines for depression like monoamine oxidase (MAO) inhibitors, fluoxetine, or paroxetine -clonidine -dobutamine -epinephrine -isoproterenol -reserpine This list may not describe all possible interactions. Give your health care provider a list of all the medicines, herbs, non-prescription drugs, or dietary supplements you use. Also tell them if you smoke, drink alcohol, or use illegal drugs. Some items may interact with your medicine. What should I watch for while using this medicine? Visit your doctor or health care professional for regular check ups. Contact your doctor right away if your symptoms worsen. Check your blood pressure and pulse rate regularly. Ask your health care professional what your blood pressure and pulse rate should be, and when you should contact them. You may get drowsy or dizzy. Do not drive, use machinery, or do anything that needs mental alertness until you know how this medicine affects you. Do not sit or stand up quickly, especially if you are an older patient. This reduces the risk of dizzy or fainting spells. Contact your doctor if these symptoms continue. Alcohol may interfere with  the effect of this medicine. Avoid alcoholic drinks. What side effects may I notice from receiving this medicine? Side effects that you should report to your doctor or health care professional as soon as possible: -allergic reactions like skin rash, itching or hives -cold or numb hands or feet -depression -difficulty breathing -faint -fever with sore throat -irregular heartbeat, chest pain -rapid weight gain -swollen legs or ankles Side effects that usually do not require medical attention (report to your doctor or health care professional if they continue or are bothersome): -anxiety or nervousness -change in sex drive or performance -dry skin -headache -nightmares or trouble sleeping -short term memory loss -stomach upset or diarrhea -unusually tired  This list may not describe all possible side effects. Call your doctor for medical advice about side effects. You may report side effects to FDA at 1-800-FDA-1088. Where should I keep my medicine? Keep out of the reach of children. Store at room temperature between 15 and 30 degrees C (59 and 86 degrees F). Throw away any unused medicine after the expiration date. NOTE: This sheet is a summary. It may not cover all possible information. If you have questions about this medicine, talk to your doctor, pharmacist, or health care provider.  2014, Elsevier/Gold Standard. (2013-05-04 14:40:36)  Glipizide tablets What is this medicine? GLIPIZIDE (GLIP i zide) helps to treat type 2 diabetes. Treatment is combined with diet and exercise. The medicine helps your body to use insulin better. This medicine may be used for other purposes; ask your health care provider or pharmacist if you have questions. COMMON BRAND NAME(S): Glucotrol What should I tell my health care provider before I take this medicine? They need to know if you have any of these conditions: -diabetic ketoacidosis -glucose-6-phosphate dehydrogenase deficiency -heart  disease -kidney disease -liver disease -porphyria -severe infection or injury -thyroid disease -an unusual or allergic reaction to glipizide, sulfa drugs, other medicines, foods, dyes, or preservatives -pregnant or trying to get pregnant -breast-feeding How should I use this medicine? Take this medicine by mouth. Swallow with a drink of water. Do not take with food. Take it 30 minutes before a meal. Follow the directions on the prescription label. If you take this medicine once a day, take it 30 minutes before breakfast. Take your doses at the same time each day. Do not take more often than directed. Talk to your pediatrician regarding the use of this medicine in children. Special care may be needed. Elderly patients over 59 years old may have a stronger reaction and need a smaller dose. Overdosage: If you think you have taken too much of this medicine contact a poison control center or emergency room at once. NOTE: This medicine is only for you. Do not share this medicine with others. What if I miss a dose? If you miss a dose, take it as soon as you can. If it is almost time for your next dose, take only that dose. Do not take double or extra doses. What may interact with this medicine? -bosentan -chloramphenicol -cisapride -clarithromycin -medicines for fungal or yeast infections -metoclopramide -probenecid -warfarin Many medications may cause an increase or decrease in blood sugar, these include: -alcohol containing beverages -aspirin and aspirin-like drugs -chloramphenicol -chromium -diuretics -female hormones, like estrogens or progestins and birth control pills -heart medicines -isoniazid -female hormones or anabolic steroids -medicines for weight loss -medicines for allergies, asthma, cold, or cough -medicines for mental problems -medicines called MAO Inhibitors like Nardil, Parnate, Marplan, Eldepryl -niacin -NSAIDs, medicines for pain and inflammation, like ibuprofen  or naproxen -pentamidine -phenytoin -probenecid -quinolone antibiotics like ciprofloxacin, levofloxacin, ofloxacin -some herbal dietary supplements -steroid medicines like prednisone or cortisone -thyroid medicine This list may not describe all possible interactions. Give your health care provider a list of all the medicines, herbs, non-prescription drugs, or dietary supplements you use. Also tell them if you smoke, drink alcohol, or use illegal drugs. Some items may interact with your medicine. What should I watch for while using this medicine? Visit your doctor or health care professional for regular checks on your progress. A test called the HbA1C (A1C) will be monitored. This is a simple blood test. It measures your blood sugar control over  the last 2 to 3 months. You will receive this test every 3 to 6 months. Learn how to check your blood sugar. Learn the symptoms of low and high blood sugar and how to manage them. Always carry a quick-source of sugar with you in case you have symptoms of low blood sugar. Examples include hard sugar candy or glucose tablets. Make sure others know that you can choke if you eat or drink when you develop serious symptoms of low blood sugar, such as seizures or unconsciousness. They must get medical help at once. Tell your doctor or health care professional if you have high blood sugar. You might need to change the dose of your medicine. If you are sick or exercising more than usual, you might need to change the dose of your medicine. Do not skip meals. Ask your doctor or health care professional if you should avoid alcohol. Many nonprescription cough and cold products contain sugar or alcohol. These can affect blood sugar. This medicine can make you more sensitive to the sun. Keep out of the sun. If you cannot avoid being in the sun, wear protective clothing and use sunscreen. Do not use sun lamps or tanning beds/booths. Wear a medical ID bracelet or chain, and  carry a card that describes your disease and details of your medicine and dosage times. What side effects may I notice from receiving this medicine? Side effects that you should report to your doctor or health care professional as soon as possible: -allergic reactions like skin rash, itching or hives, swelling of the face, lips, or tongue -breathing problems -dark urine -fever, chills, sore throat -signs and symptoms of low blood sugar such as feeling anxious, confusion, dizziness, increased hunger, unusually weak or tired, sweating, shakiness, cold, irritable, headache, blurred vision, fast heartbeat, loss of consciousness -unusual bleeding or bruising -yellowing of the eyes or skin Side effects that usually do not require medical attention (report to your doctor or health care professional if they continue or are bothersome): -diarrhea -dizziness -headache -heartburn -nausea -stomach gas This list may not describe all possible side effects. Call your doctor for medical advice about side effects. You may report side effects to FDA at 1-800-FDA-1088. Where should I keep my medicine? Keep out of the reach of children. Store at room temperature below 30 degrees C (86 degrees F). Throw away any unused medicine after the expiration date. NOTE: This sheet is a summary. It may not cover all possible information. If you have questions about this medicine, talk to your doctor, pharmacist, or health care provider.  2014, Elsevier/Gold Standard. (2012-12-13 14:42:46)

## 2013-12-27 NOTE — Assessment & Plan Note (Signed)
BP Readings from Last 3 Encounters:  12/27/13 140/90  12/10/13 165/92  11/12/13 137/86    Lab Results  Component Value Date   NA 138 10/04/2013   K 4.0 10/04/2013   CREATININE 1.07 10/04/2013    Assessment: Blood pressure control: controlled Progress toward BP goal:  at goal Comments: none  Plan: Medications:  continue current medications Educational resources provided: brochure;other (see comments) Self management tools provided: other (see comments) Other plans: f/u in 3 months

## 2013-12-27 NOTE — Assessment & Plan Note (Signed)
Rx refills of Lovastatin 20 mg qd encouraged compliance

## 2013-12-27 NOTE — Progress Notes (Signed)
Case discussed with Dr. McLean soon after the resident saw the patient.  We reviewed the resident's history and exam and pertinent patient test results.  I agree with the assessment, diagnosis, and plan of care documented in the resident's note. 

## 2014-01-03 ENCOUNTER — Encounter: Payer: Self-pay | Admitting: Internal Medicine

## 2014-01-04 ENCOUNTER — Telehealth: Payer: Self-pay | Admitting: *Deleted

## 2014-01-04 NOTE — Telephone Encounter (Signed)
Error

## 2014-01-07 ENCOUNTER — Ambulatory Visit: Payer: No Typology Code available for payment source | Admitting: Internal Medicine

## 2014-02-22 ENCOUNTER — Other Ambulatory Visit: Payer: Self-pay | Admitting: *Deleted

## 2014-02-22 DIAGNOSIS — E1165 Type 2 diabetes mellitus with hyperglycemia: Secondary | ICD-10-CM

## 2014-02-22 DIAGNOSIS — G43909 Migraine, unspecified, not intractable, without status migrainosus: Secondary | ICD-10-CM

## 2014-02-22 DIAGNOSIS — IMO0001 Reserved for inherently not codable concepts without codable children: Secondary | ICD-10-CM

## 2014-02-22 MED ORDER — INSULIN NPH (HUMAN) (ISOPHANE) 100 UNIT/ML ~~LOC~~ SUSP: SUBCUTANEOUS | Status: DC

## 2014-02-22 MED ORDER — METOPROLOL TARTRATE 50 MG PO TABS: 50.0000 mg | ORAL_TABLET | Freq: Two times a day (BID) | ORAL | Status: DC

## 2014-02-22 NOTE — Telephone Encounter (Signed)
Pt is using Relion Novolin N insulin with same directions. Stanton KidneyDebra Layla Kesling RN 02/22/14 3PM

## 2014-02-22 NOTE — Telephone Encounter (Signed)
Please schedule a follow-up appointment with patient's PCP. 

## 2014-02-22 NOTE — Telephone Encounter (Signed)
Is she using an insulin pen or drawing up her insulin from a vial?

## 2014-02-22 NOTE — Telephone Encounter (Signed)
Called pt - she uses a vial.

## 2014-02-25 NOTE — Telephone Encounter (Signed)
Message sent to front desk pool - pt needs appt with PCP per Dr Meredith PelJoines. Stanton KidneyDebra Weslyn Holsonback RN 02/25/14 8:30AM

## 2014-02-26 ENCOUNTER — Encounter: Payer: Self-pay | Admitting: *Deleted

## 2014-02-27 ENCOUNTER — Other Ambulatory Visit: Payer: Self-pay | Admitting: Internal Medicine

## 2014-02-27 DIAGNOSIS — G43909 Migraine, unspecified, not intractable, without status migrainosus: Secondary | ICD-10-CM

## 2014-02-27 DIAGNOSIS — E1165 Type 2 diabetes mellitus with hyperglycemia: Principal | ICD-10-CM

## 2014-02-27 DIAGNOSIS — IMO0001 Reserved for inherently not codable concepts without codable children: Secondary | ICD-10-CM

## 2014-02-27 MED ORDER — METOPROLOL TARTRATE 50 MG PO TABS: 50.0000 mg | ORAL_TABLET | Freq: Two times a day (BID) | ORAL | Status: DC

## 2014-02-27 MED ORDER — INSULIN NPH (HUMAN) (ISOPHANE) 100 UNIT/ML ~~LOC~~ SUSP: SUBCUTANEOUS | Status: DC

## 2014-03-18 ENCOUNTER — Other Ambulatory Visit: Payer: Self-pay | Admitting: Internal Medicine

## 2014-03-18 ENCOUNTER — Ambulatory Visit: Payer: Self-pay

## 2014-03-21 ENCOUNTER — Encounter: Payer: Self-pay | Admitting: Internal Medicine

## 2014-03-28 NOTE — Addendum Note (Signed)
Addended by: Neomia DearPOWERS, Elissia Spiewak E on: 03/28/2014 02:58 PM   Modules accepted: Orders

## 2014-04-03 ENCOUNTER — Other Ambulatory Visit: Payer: Self-pay | Admitting: *Deleted

## 2014-04-03 DIAGNOSIS — IMO0001 Reserved for inherently not codable concepts without codable children: Secondary | ICD-10-CM

## 2014-04-03 DIAGNOSIS — E1165 Type 2 diabetes mellitus with hyperglycemia: Principal | ICD-10-CM

## 2014-04-03 MED ORDER — GLIPIZIDE 10 MG PO TABS: 10.0000 mg | ORAL_TABLET | Freq: Two times a day (BID) | ORAL | Status: DC

## 2014-04-03 MED ORDER — LISINOPRIL 40 MG PO TABS: 40.0000 mg | ORAL_TABLET | Freq: Every day | ORAL | Status: DC

## 2014-04-18 ENCOUNTER — Ambulatory Visit: Payer: No Typology Code available for payment source

## 2014-05-17 ENCOUNTER — Other Ambulatory Visit: Payer: Self-pay | Admitting: *Deleted

## 2014-05-17 DIAGNOSIS — G43909 Migraine, unspecified, not intractable, without status migrainosus: Secondary | ICD-10-CM

## 2014-05-17 DIAGNOSIS — E785 Hyperlipidemia, unspecified: Secondary | ICD-10-CM

## 2014-05-17 MED ORDER — LORATADINE 10 MG PO TABS: 10.0000 mg | ORAL_TABLET | Freq: Every day | ORAL | Status: DC | PRN

## 2014-05-17 MED ORDER — METOPROLOL TARTRATE 50 MG PO TABS
50.0000 mg | ORAL_TABLET | Freq: Two times a day (BID) | ORAL | Status: DC
Start: 2014-05-17 — End: 2014-07-30

## 2014-05-17 MED ORDER — CITALOPRAM HYDROBROMIDE 40 MG PO TABS: 40.0000 mg | ORAL_TABLET | Freq: Every day | ORAL | Status: DC

## 2014-05-17 MED ORDER — LOVASTATIN 20 MG PO TABS: 20.0000 mg | ORAL_TABLET | Freq: Every day | ORAL | Status: DC

## 2014-06-11 ENCOUNTER — Encounter: Payer: Self-pay | Admitting: Internal Medicine

## 2014-06-11 ENCOUNTER — Ambulatory Visit (INDEPENDENT_AMBULATORY_CARE_PROVIDER_SITE_OTHER): Payer: No Typology Code available for payment source | Admitting: Internal Medicine

## 2014-06-11 ENCOUNTER — Ambulatory Visit (HOSPITAL_COMMUNITY)
Admission: RE | Admit: 2014-06-11 | Discharge: 2014-06-11 | Disposition: A | Discharge status: Home / Self Care | Payer: No Typology Code available for payment source | Source: Ambulatory Visit | Attending: Internal Medicine | Admitting: Internal Medicine

## 2014-06-11 VITALS — BP 158/95 | HR 79 | Temp 98.0°F | Wt 316.8 lb

## 2014-06-11 DIAGNOSIS — R600 Localized edema: Secondary | ICD-10-CM

## 2014-06-11 DIAGNOSIS — M7730 Calcaneal spur, unspecified foot: Secondary | ICD-10-CM | POA: Insufficient documentation

## 2014-06-11 DIAGNOSIS — E1165 Type 2 diabetes mellitus with hyperglycemia: Principal | ICD-10-CM

## 2014-06-11 DIAGNOSIS — M79672 Pain in left foot: Secondary | ICD-10-CM

## 2014-06-11 DIAGNOSIS — M79671 Pain in right foot: Secondary | ICD-10-CM | POA: Insufficient documentation

## 2014-06-11 DIAGNOSIS — Z23 Encounter for immunization: Secondary | ICD-10-CM

## 2014-06-11 DIAGNOSIS — I10 Essential (primary) hypertension: Secondary | ICD-10-CM

## 2014-06-11 DIAGNOSIS — R609 Edema, unspecified: Secondary | ICD-10-CM

## 2014-06-11 DIAGNOSIS — M6530 Trigger finger, unspecified finger: Secondary | ICD-10-CM

## 2014-06-11 DIAGNOSIS — M79609 Pain in unspecified limb: Secondary | ICD-10-CM

## 2014-06-11 DIAGNOSIS — M65311 Trigger thumb, right thumb: Secondary | ICD-10-CM

## 2014-06-11 DIAGNOSIS — IMO0001 Reserved for inherently not codable concepts without codable children: Secondary | ICD-10-CM

## 2014-06-11 DIAGNOSIS — M65312 Trigger thumb, left thumb: Secondary | ICD-10-CM

## 2014-06-11 LAB — GLUCOSE, CAPILLARY: GLUCOSE-CAPILLARY: 234 mg/dL — AB (ref 70–99)

## 2014-06-11 LAB — POCT GLYCOSYLATED HEMOGLOBIN (HGB A1C): HEMOGLOBIN A1C: 8.8

## 2014-06-11 MED ORDER — HYDROCHLOROTHIAZIDE 25 MG PO TABS: 25.0000 mg | ORAL_TABLET | Freq: Every day | ORAL | Status: DC

## 2014-06-11 MED ORDER — METFORMIN HCL 500 MG PO TABS: 1000.0000 mg | ORAL_TABLET | Freq: Every day | ORAL | Status: DC

## 2014-06-11 NOTE — Assessment & Plan Note (Signed)
Patient's hgba1c improved significantly.  Lab Results  Component Value Date   HGBA1C 8.8 06/11/2014   From 10.1 last.  Continue same insulin regimen 26 NPH am and 28 NPH PM. Increased metformin to 1000mg  breakfast and 500mg  at dinner ( was 500mg  BID). Continue glipizide 10mg  daily.   Encouraged exercise and diet.

## 2014-06-11 NOTE — Assessment & Plan Note (Signed)
Has trigger finger of left thumb. Will refer back to sports medicine since it improved with steroid injection 6 months ago.

## 2014-06-11 NOTE — Assessment & Plan Note (Addendum)
Worst in the morning when gets up, improves afterwards. But then worsens again when she is on her feet the whole day.  Likely plantar fascitis as plantar surface TTP and to squeezing.  Will refer to sports medicine. Also will obtain left foot xray.

## 2014-06-11 NOTE — Progress Notes (Signed)
INTERNAL MEDICINE TEACHING ATTENDING ADDENDUM - Earl LagosNischal Cordie Buening, MD: I personally saw and evaluated Ms. Sadowski in this clinic visit in conjunction with the resident, Dr. Tasia CatchingsAhmed. I have discussed patient's plan of care with medical resident during this visit. I have confirmed the physical exam findings and have read and agree with the clinic note including the plan with the following addition: - A1C improving. Increase metformin to 1000 mg in AM and 500 mg in PM - Pt educated about diet and exercise. She states she is willing to try to improve both - BP elevated. Will increase HCTZ to 25 mg - Pt with likely plantar fascitis. F/u left foot xray. To f/u with sports medicine

## 2014-06-11 NOTE — Progress Notes (Signed)
   Subjective:    Patient ID: Sheila Ferguson, female    DOB: [birth year], 53 y.o.   MRN: [id number]  Foot Pain Pertinent negatives include no arthralgias, chest pain, chills, congestion, coughing, fatigue, fever, headaches, joint swelling, nausea, neck pain, numbness, sore throat or weakness.    53 yo female with DM II, HTN, HLD, trigger thumb comes for right thumb pain, bilateral heel pain, and to get flu vaccine.  Has right thumb pain, had injection 6 months ago but pain came back 1 month ago. Cannot bend the right thumb because of pain. Has some numbness/tingling on the hand too.  Also has bilateral heel pain, L>R. Worst in the morning after getting up. Gets better then gets worse as she is on her feet the whole day.   Continues to have BLE edema.  Watching her diet, takes medicine on time.  Denies any sob, cp, cough, n/v, fever, chills, headache.   Review of Systems  Constitutional: Negative for fever, chills, activity change, appetite change and fatigue.  HENT: Negative for congestion, drooling, ear discharge, ear pain, nosebleeds, postnasal drip, rhinorrhea, sinus pressure and sore throat.   Eyes: Negative for pain, discharge and visual disturbance.  Respiratory: Negative for cough, chest tightness, shortness of breath and wheezing.   Cardiovascular: Negative for chest pain, palpitations and leg swelling.  Gastrointestinal: Negative for nausea, diarrhea, constipation, abdominal distention and anal bleeding.  Genitourinary: Negative for dysuria, urgency, hematuria, decreased urine volume and difficulty urinating.  Musculoskeletal: Negative for arthralgias, back pain, joint swelling, neck pain and neck stiffness.  Skin: Negative.   Allergic/Immunologic: Negative.   Neurological: Negative for dizziness, seizures, syncope, speech difficulty, weakness, light-headedness, numbness and headaches.  Hematological: Negative.   Psychiatric/Behavioral: Negative.        Objective:   Physical Exam  Constitutional: She is oriented to person, place, and time. She appears well-developed and well-nourished. No distress.  Obese female  HENT:  Head: Normocephalic and atraumatic.  Right Ear: External ear normal.  Left Ear: External ear normal.  Nose: Nose normal.  Mouth/Throat: Oropharynx is clear and moist.  Eyes: Conjunctivae and EOM are normal. Pupils are equal, round, and reactive to light. Right eye exhibits no discharge. Left eye exhibits no discharge. No scleral icterus.  Neck: Normal range of motion. Neck supple. No JVD present. No thyromegaly present.  Cardiovascular: Normal rate, regular rhythm, S1 normal, S2 normal, normal heart sounds and intact distal pulses.  Exam reveals no gallop and no friction rub.   No murmur heard. Pulmonary/Chest: Effort normal and breath sounds normal. No respiratory distress. She has no wheezes. She has no rales. She exhibits no tenderness.  Abdominal: Soft. Bowel sounds are normal. She exhibits no distension and no mass. There is no tenderness. There is no rebound and no guarding.  Musculoskeletal: Normal range of motion. She exhibits no edema and no tenderness.  Right thumb limited ROM due to pain. Left thumb is fine.  Left heel TTP on the plantar surface, posteriorly, and also laterally and medially.   Lymphadenopathy:    She has no cervical adenopathy.  Neurological: She is alert and oriented to person, place, and time. She has normal strength and normal reflexes. No cranial nerve deficit or sensory deficit.  Skin: No rash noted. She is not diaphoretic. No erythema. No pallor.  Psychiatric: She has a normal mood and affect. Her behavior is normal.          Assessment & Plan:  See problem based ap.

## 2014-06-11 NOTE — Assessment & Plan Note (Signed)
Should improve with HCTZ increased to 25mg .

## 2014-06-11 NOTE — Patient Instructions (Signed)
Take metformin 1000mg  (2 tablets) at breakfast and 500mg  (1 tablet) at dinner.  Continue your same insulin regimen.  Increase your hydrochlorothiazide to 25mg  (you currently taking 12.5mg ).  Follow up with sports medicine for your thumb pain and heel pain.  General Instructions:   Thank you for bringing your medicines today. This helps us keep you safe from mistakes.   Progress Toward Treatment Goals:  Treatment Goal 12/27/2013  Hemoglobin A1C (No Data)  Blood pressure at goal  Prevent falls unable to assess    Self Care Goals & Plans:  Self Care Goal 12/27/2013  Manage my medications take my medicines as prescribed; bring my medications to every visit; refill my medications on time  Monitor my health keep track of my blood glucose; bring my glucose meter and log to each visit; keep track of my blood pressure; bring my blood pressure log to each visit; keep track of my weight; check my feet daily  Eat healthy foods drink diet soda or water instead of juice or soda; eat more vegetables; eat foods that are low in salt; eat baked foods instead of fried foods; eat fruit for snacks and desserts; eat smaller portions  Be physically active find an activity I enjoy  Meeting treatment goals maintain the current self-care plan    Home Blood Glucose Monitoring 12/27/2013  Check my blood sugar 3 times a day  When to check my blood sugar before meals     Care Management & Community Referrals:  Referral 12/27/2013  Referrals made for care management support none needed  Referrals made to community resources none

## 2014-06-11 NOTE — Assessment & Plan Note (Signed)
BP higher than goal. Filed Vitals:   06/11/14 1046  BP: 158/95  Pulse: 79  Temp: 98 F (36.7 C)    Will increase HCTZ to 25mg  daily. Continue lisinopril 40mg  qdaily and metoprolol 50mg  BID.

## 2014-06-14 ENCOUNTER — Other Ambulatory Visit: Payer: Self-pay | Admitting: Internal Medicine

## 2014-06-21 ENCOUNTER — Ambulatory Visit (INDEPENDENT_AMBULATORY_CARE_PROVIDER_SITE_OTHER): Payer: No Typology Code available for payment source | Admitting: Family Medicine

## 2014-06-21 ENCOUNTER — Encounter: Payer: Self-pay | Admitting: *Deleted

## 2014-06-21 ENCOUNTER — Encounter: Payer: Self-pay | Admitting: Family Medicine

## 2014-06-21 VITALS — BP 137/86 | Ht 64.0 in | Wt 315.0 lb

## 2014-06-21 DIAGNOSIS — M65312 Trigger thumb, left thumb: Secondary | ICD-10-CM

## 2014-06-21 DIAGNOSIS — M722 Plantar fascial fibromatosis: Secondary | ICD-10-CM | POA: Insufficient documentation

## 2014-06-21 DIAGNOSIS — M79672 Pain in left foot: Secondary | ICD-10-CM

## 2014-06-21 DIAGNOSIS — M65311 Trigger thumb, right thumb: Secondary | ICD-10-CM

## 2014-06-21 DIAGNOSIS — M79671 Pain in right foot: Secondary | ICD-10-CM

## 2014-06-21 MED ORDER — METHYLPREDNISOLONE ACETATE 40 MG/ML IJ SUSP
40.0000 mg | Freq: Once | INTRAMUSCULAR | Status: AC
  Administered 2014-06-21: 40 mg via INTRA_ARTICULAR

## 2014-06-21 NOTE — Progress Notes (Signed)
Sheila Ferguson - 53 y.o. female MRN [id number]  Date of birth: Nov 09, 1960  CC & HPI:  The patient presents for re-evaluation of: Right thumb pain: Long-standing right thumb pain that is having worsening stiffness and triggering. Pain with active flexion or extension. Denies fevers, chills, surrounding ear edema. No recent trauma. No improvement with medications.  Bilateral plantar heel pain: Left greater than right bilateral plantar heel pain worse first thing in the morning. Worse with significant standing or walking. She has not tried any specific therapeutic exercises, bracing or specific medications. She does report a history of heel spurs and has received a plantar fascial injection 10 years ago at provided significant relief. She has changed shoes recently and believes this may have exacerbated and she has gotten new shoes with some arch support at this time.   ROS:  Per HPI.   HISTORY: Past Medical, Surgical, Social, and Family History Reviewed & Updated per EMR.  Pertinent Historical Findings include: Last seen 11/3013 for bilateral trigger thumbs. Hx of DM, HTN, GERD.   OBJECTIVE FINDINGS:  VS:   HT:5\' 4"  (162.6 cm)   WT:315 lb (142.883 kg)  BMI:54.2          BP:137/86 mmHg  HR: bpm  TEMP: ( )  RESP:   PHYSICAL EXAM: GENERAL:  adult obese African American female. In no discomfort; no respiratory distress   PSYCH: alert and appropriate, good insight   NEURO: Sensation is intact to light touch in bilateral hands and feet, no gloves and stocking or dermatomal dysesthesia   VASCULAR:  bilateral radial pulse, dorsalis pedis and posterior tibialis pulses 2+/4.  No significant edema.    Right hand EXAM: Appearance:  overall normal-appearing. Thumb is held in slight flexion   Skin: No overlying erythema/ecchymosis.  Palpation: TTP over: Flexor pollicis longus tendon  No TTP over: Proximal carpal row, navicular, extensor pollicis longus   Special Tests:  negative Finkelstein's  test, positive triggering with passive flexion of the thumb    Bilateral foot EXAM: Appearance:  overall normal-appearing, moderate longitudinal arch with some collapse. No significant hallux valgus or hallux rigidus.   Skin: No overlying erythema/ecchymosis.  Palpation: TTP over: Right greater than left plantar fascia insertion at the calcaneus  No TTP over: Achilles tendon, posterior tibialis, peroneal tendons. No talar dome tenderness   Strength & ROM: 5/5 Strength: Plantar flexion, dorsiflexion, Inversion and eversion.  Plantar flexion and dorsi flexion from 40 to 90.     Limited MSK Ultrasound of right thumb and bilateral heels: Findings:  hypoechoic change with halo sign around the flexor pollicis longus.  Left plantar fascia measures 61 mm. Right plantar fascia measures 50 mm   Impression: The above findings are consistent with flexor pollicis longus and trigger thumb.  Bilateral plantar fasciitis/fibromatosis left greater than right      ASSESSMENT: 1. Trigger thumb of both thumbs   2. Plantar fasciitis, left    PROCEDURE NOTE : Right Trigger Finger Injection After discussing the risks (including hypopigmentation), benefits and expected outcomes of the injection and all questions were reviewed and answered,  she wished to undergo the above named procedure.  Written consent was obtained. After an appropriate time out was taken the right thumb was sterilely prepped and injected as below: Prep:    Betadine and alcohol,  Ethel chloride.  Approach:  direct Needle:  21g 1.5inch Meds:   1cc 40mg  Depo-Medrol, 1 cc of 1% lidocaine A bandaid was applied to the area. This procedure was  well tolerated and there were no complications.    PROCEDURE NOTE : Left plantar fascia Injection After discussing the risks, benefits and expected outcomes of the injection and all questions were reviewed and answered,  she wished to undergo the above named procedure.  Written consent was obtained. After  an appropriate time out was taken the left plantar surface was sterilely prepped and injected as below: Prep:    Betadine and alcohol,  Ethel chloride.  Approach:  Direct Needle:  21-gauge 1.5 inch Meds:   1 cc 40 mg Depo-Medrol, 1 cc of 1% lidocaine A bandaid was applied to the area. This procedure was well tolerated and there were no complications.    PLAN: See problem based charting & AVS for additional documentation. - Injections as above - Plantar fascial stretching and eccentric strengthening exercises reviewed. - Additional scaphoid pads added to her shoes today. > Return for custom orthotics.

## 2014-06-21 NOTE — Patient Instructions (Signed)
Remember to do the stretching and the exercises I showed you. Come back for custom orthotics

## 2014-06-26 NOTE — Progress Notes (Signed)
Patient ID: Sheila Ferguson, female   DOB: 1961-01-21, 53 y.o.   MRN: [id number] [facility]: Attending Note: I have reviewed the chart, discussed wit the Sports Medicine Fellow. I agree with assessment and treatment plan as detailed in the Fellow's note.

## 2014-07-12 ENCOUNTER — Encounter: Payer: No Typology Code available for payment source | Admitting: Family Medicine

## 2014-07-15 ENCOUNTER — Other Ambulatory Visit: Payer: Self-pay | Admitting: Internal Medicine

## 2014-07-30 ENCOUNTER — Ambulatory Visit (INDEPENDENT_AMBULATORY_CARE_PROVIDER_SITE_OTHER): Payer: No Typology Code available for payment source | Admitting: Internal Medicine

## 2014-07-30 ENCOUNTER — Encounter: Payer: Self-pay | Admitting: Internal Medicine

## 2014-07-30 VITALS — BP 134/85 | HR 90 | Temp 98.1°F | Ht 64.0 in | Wt 317.0 lb

## 2014-07-30 DIAGNOSIS — R600 Localized edema: Secondary | ICD-10-CM

## 2014-07-30 DIAGNOSIS — IMO0002 Reserved for concepts with insufficient information to code with codable children: Secondary | ICD-10-CM

## 2014-07-30 DIAGNOSIS — E1165 Type 2 diabetes mellitus with hyperglycemia: Secondary | ICD-10-CM

## 2014-07-30 DIAGNOSIS — I10 Essential (primary) hypertension: Secondary | ICD-10-CM

## 2014-07-30 DIAGNOSIS — R252 Cramp and spasm: Secondary | ICD-10-CM

## 2014-07-30 LAB — BASIC METABOLIC PANEL WITHOUT GFR
BUN: 14 mg/dL (ref 6–23)
CO2: 25 meq/L (ref 19–32)
Calcium: 8.9 mg/dL (ref 8.4–10.5)
Chloride: 100 meq/L (ref 96–112)
Creat: 1.25 mg/dL — ABNORMAL HIGH (ref 0.50–1.10)
GFR, Est African American: 57 mL/min — ABNORMAL LOW
GFR, Est Non African American: 49 mL/min — ABNORMAL LOW
Glucose, Bld: 317 mg/dL — ABNORMAL HIGH (ref 70–99)
Potassium: 4.4 meq/L (ref 3.5–5.3)
Sodium: 135 meq/L (ref 135–145)

## 2014-07-30 MED ORDER — HYDROCHLOROTHIAZIDE 12.5 MG PO TABS: 12.5000 mg | ORAL_TABLET | Freq: Every day | ORAL | Status: DC

## 2014-07-30 MED ORDER — METOPROLOL TARTRATE 100 MG PO TABS: 100.0000 mg | ORAL_TABLET | Freq: Two times a day (BID) | ORAL | Status: DC

## 2014-07-30 NOTE — Progress Notes (Signed)
   Subjective:    Patient ID: Sheila Ferguson, female    DOB: 06/10/1961, 53 y.o.   MRN: [id number]  HPI Comments: [name] is a 53 yo F with a PMH of HTN, migrain and DM here with c/o of B/L leg cramping x 2 days.  It started two night ago and has persisted.  It occurs all day, worse at night.  She has felt it before when she was on diuretic previously.  She feels it is related to HCTZ dose increase 6 weeks ago.  She reports that eating mustard and taking hot shower helped the pain somewhat.  She denies weakness or change in sensation.     Review of Systems  Constitutional: Negative for fever, chills and appetite change.  Respiratory: Negative for shortness of breath.   Cardiovascular: Negative for chest pain, palpitations and leg swelling.  Gastrointestinal: Negative for diarrhea and constipation.  Genitourinary: Negative for dysuria.  Neurological: Negative for syncope and weakness.       Objective:   Physical Exam  Constitutional: She is oriented to person, place, and time. No distress.  HENT:  Head: Normocephalic and atraumatic.  Mouth/Throat: Oropharynx is clear and moist. No oropharyngeal exudate.  Eyes: EOM are normal. Pupils are equal, round, and reactive to light.  Cardiovascular: Normal rate, regular rhythm and normal heart sounds.  Exam reveals no gallop and no friction rub.   No murmur heard. Pulmonary/Chest: Effort normal and breath sounds normal. No respiratory distress. She has no wheezes. She has no rales.  Abdominal: Soft. Bowel sounds are normal. She exhibits no distension. There is no tenderness. There is no rebound.  Musculoskeletal: Normal range of motion. She exhibits edema. She exhibits no tenderness.  2+ B/L lower extremity edema  Neurological: She is alert and oriented to person, place, and time. No cranial nerve deficit.  Skin: Skin is warm. She is not diaphoretic.  Psychiatric: She has a normal mood and affect. Her behavior is normal.  Vitals  reviewed.         Assessment & Plan:  Please see problem based assessment and plan.

## 2014-07-30 NOTE — Patient Instructions (Addendum)
General Instructions:  1. I have decreased your HCTZ to 12.5mg  once per day.  I have increased your metoprolol to 100mg  twice per day.  I will check your labs today.  Please return to clinic next week for blood pressure check.    2. Please take all medications as prescribed.   3. If you have worsening of your symptoms or new symptoms arise, please call the clinic ([phone]), or go to the ER immediately if symptoms are severe.    Treatment Goals:  Goals (1 Years of Data) as of 07/30/14          As of Today 06/21/14 06/11/14 12/27/13 12/10/13     Blood Pressure   . Blood Pressure < 140/90  134/85 137/86 158/95 140/90 165/92     Result Component   . HEMOGLOBIN A1C < 7.0    8.8 10.1    . LDL CALC < 100            Progress Toward Treatment Goals:  Treatment Goal 12/27/2013  Hemoglobin A1C (No Data)  Blood pressure at goal  Prevent falls unable to assess    Self Care Goals & Plans:  Self Care Goal 07/30/2014  Manage my medications take my medicines as prescribed; bring my medications to every visit; refill my medications on time; follow the sick day instructions if I am sick  Monitor my health keep track of my blood glucose; bring my glucose meter and log to each visit; keep track of my weight; check my feet daily  Eat healthy foods eat more vegetables; eat fruit for snacks and desserts; eat foods that are low in salt; drink diet soda or water instead of juice or soda  Be physically active find an activity I enjoy  Meeting treatment goals -    Home Blood Glucose Monitoring 12/27/2013  Check my blood sugar 3 times a day  When to check my blood sugar before meals     Care Management & Community Referrals:  Referral 12/27/2013  Referrals made for care management support none needed  Referrals made to community resources none    Leg Cramps Leg cramps that occur during exercise can be caused by poor circulation or dehydration. However, muscle cramps that occur at rest or during  the night are usually not due to any serious medical problem. Heat cramps may cause muscle spasms during hot weather.  CAUSES There is no clear cause for muscle cramps. However, dehydration may be a factor for those who do not drink enough fluids and those who exercise in the heat. Imbalances in the level of sodium, potassium, calcium or magnesium in the muscle tissue may also be a factor. Some medications, such as water pills (diuretics), may cause loss of chemicals that the body needs (like sodium and potassium) and cause muscle cramps. TREATMENT   Make sure your diet has enough fluids and essential minerals for the muscle to work normally.  Avoid strenuous exercise for several days if you have been having frequent leg cramps.  Stretch and massage the cramped muscle for several minutes.  Some medicines may be helpful in some patients with night cramps. Only take over-the-counter or prescription medicines as directed by your caregiver. SEEK IMMEDIATE MEDICAL CARE IF:   Your leg cramps become worse.  Your foot becomes cold, numb, or blue. Document Released: 10/07/2004 Document Revised: 11/22/2011 Document Reviewed: 09/24/2008 [facility] Patient Information 2015 [place], [place]. This information is not intended to replace advice given to you by your health care  provider. Make sure you discuss any questions you have with your health care provider.

## 2014-07-31 DIAGNOSIS — R252 Cramp and spasm: Secondary | ICD-10-CM | POA: Insufficient documentation

## 2014-07-31 NOTE — Assessment & Plan Note (Addendum)
She denies DOE but reports PND and orthopnea that she attributes to OSA.  HTN, obesity and DM put her at risk for HF.  There is no ECHO on file and she is agreeble to getting one. - refer for 2D ECHO to assess systolic and diastolic function

## 2014-07-31 NOTE — Assessment & Plan Note (Addendum)
BP Readings from Last 3 Encounters:  07/30/14 134/85  06/21/14 137/86  06/11/14 158/95    Lab Results  Component Value Date   NA 135 07/30/2014   K 4.4 07/30/2014   CREATININE 1.25* 07/30/2014    Assessment: Blood pressure control:  controlled  Progress toward BP goal:   at goal  Plan: Medications:  continue current medications:  Decrease HCTZ back to 12.5mg  daily, continue lisinopril 40mg  daily, INCREASE metoprolol to 100mg  BID. Educational resources provided: brochure, handout, video Other plans: Return to clinic next week for BP check.  Cr 1.25 today, baseline ~1.  Plan to recheck next visit since she will be on lower dose of diuretic.

## 2014-07-31 NOTE — Assessment & Plan Note (Addendum)
She reports cramping B/L starting at night after recently increased diuretic.  She has felt this same sensation in the past when on higher dose of diuretic.   - will reduce dose of diuretic - labs today - electrolytes WNL - follow-up in clinic next week to see if symptoms have resolved

## 2014-07-31 NOTE — Assessment & Plan Note (Signed)
Lab Results  Component Value Date   HGBA1C 8.8 06/11/2014   HGBA1C 10.1 12/27/2013   HGBA1C 11.9 10/04/2013     Assessment: Diabetes control:  uncontrolled Progress toward A1C goal:   improving (based on last A1c) Comments:  Meter download reviewed.  Average CBG 216 with 100% of readings above target.  She is not willing to go up further on metformin. Since she is not due for A1c check until next month will continue current therapy and have her follow-up with PCP for further adjustments.   Plan: Medications:  continue current medications Home glucose monitoring:  3-4x daily Frequency:   Timing:   Instruction/counseling given: reminded to bring blood glucose meter & log to each visit Educational resources provided: brochure, handout Other plans: Return to clinic next month for A1c.

## 2014-08-01 NOTE — Progress Notes (Signed)
Case discussed with Dr. Wilson soon after the resident saw the patient.  We reviewed the resident's history and exam and pertinent patient test results.  I agree with the assessment, diagnosis and plan of care documented in the resident's note. 

## 2014-08-06 ENCOUNTER — Ambulatory Visit: Payer: Self-pay | Admitting: Internal Medicine

## 2014-08-19 ENCOUNTER — Other Ambulatory Visit: Payer: Self-pay | Admitting: Internal Medicine

## 2014-08-20 NOTE — Progress Notes (Signed)
[facility] 08/23/14 1PM. Challen Spainhour RN 08/20/14 11:40AM

## 2014-08-23 ENCOUNTER — Other Ambulatory Visit (HOSPITAL_COMMUNITY): Payer: Self-pay

## 2014-08-23 ENCOUNTER — Ambulatory Visit (HOSPITAL_COMMUNITY)
Admission: RE | Admit: 2014-08-23 | Discharge: 2014-08-23 | Disposition: A | Discharge status: Home / Self Care | Payer: Self-pay | Source: Ambulatory Visit | Attending: Internal Medicine | Admitting: Internal Medicine

## 2014-08-23 DIAGNOSIS — E785 Hyperlipidemia, unspecified: Secondary | ICD-10-CM | POA: Insufficient documentation

## 2014-08-23 DIAGNOSIS — I509 Heart failure, unspecified: Secondary | ICD-10-CM | POA: Insufficient documentation

## 2014-08-23 DIAGNOSIS — I10 Essential (primary) hypertension: Secondary | ICD-10-CM | POA: Insufficient documentation

## 2014-08-23 DIAGNOSIS — I369 Nonrheumatic tricuspid valve disorder, unspecified: Secondary | ICD-10-CM

## 2014-08-23 DIAGNOSIS — E119 Type 2 diabetes mellitus without complications: Secondary | ICD-10-CM | POA: Insufficient documentation

## 2014-08-23 DIAGNOSIS — K219 Gastro-esophageal reflux disease without esophagitis: Secondary | ICD-10-CM | POA: Insufficient documentation

## 2014-08-23 DIAGNOSIS — R600 Localized edema: Secondary | ICD-10-CM

## 2014-08-23 NOTE — Progress Notes (Signed)
  Echocardiogram 2D Echocardiogram has been performed.  Leta JunglingCooper, Sheila Ferguson M 08/23/2014, 2:36 PM

## 2014-09-04 ENCOUNTER — Encounter: Payer: Self-pay | Admitting: Internal Medicine

## 2014-09-04 ENCOUNTER — Ambulatory Visit (INDEPENDENT_AMBULATORY_CARE_PROVIDER_SITE_OTHER): Payer: Self-pay | Admitting: Internal Medicine

## 2014-09-04 VITALS — BP 138/78 | HR 81 | Temp 98.0°F | Ht 64.0 in | Wt 313.7 lb

## 2014-09-04 DIAGNOSIS — E1165 Type 2 diabetes mellitus with hyperglycemia: Secondary | ICD-10-CM

## 2014-09-04 DIAGNOSIS — E1122 Type 2 diabetes mellitus with diabetic chronic kidney disease: Secondary | ICD-10-CM | POA: Insufficient documentation

## 2014-09-04 DIAGNOSIS — IMO0002 Reserved for concepts with insufficient information to code with codable children: Secondary | ICD-10-CM

## 2014-09-04 DIAGNOSIS — R600 Localized edema: Secondary | ICD-10-CM

## 2014-09-04 DIAGNOSIS — N182 Chronic kidney disease, stage 2 (mild): Secondary | ICD-10-CM

## 2014-09-04 DIAGNOSIS — I10 Essential (primary) hypertension: Secondary | ICD-10-CM

## 2014-09-04 DIAGNOSIS — Z0000 Encounter for general adult medical examination without abnormal findings: Secondary | ICD-10-CM

## 2014-09-04 LAB — GLUCOSE, CAPILLARY: Glucose-Capillary: 268 mg/dL — ABNORMAL HIGH (ref 70–99)

## 2014-09-04 LAB — POCT GLYCOSYLATED HEMOGLOBIN (HGB A1C): Hemoglobin A1C: 9.6

## 2014-09-04 MED ORDER — METFORMIN HCL 500 MG PO TABS: 1000.0000 mg | ORAL_TABLET | Freq: Every day | ORAL | Status: DC

## 2014-09-04 MED ORDER — GLUCOSE BLOOD VI STRP: ORAL_STRIP | Status: DC

## 2014-09-04 MED ORDER — INSULIN NPH (HUMAN) (ISOPHANE) 100 UNIT/ML ~~LOC~~ SUSP: SUBCUTANEOUS | Status: DC

## 2014-09-04 MED ORDER — METFORMIN HCL 500 MG PO TABS: 1500.0000 mg | ORAL_TABLET | Freq: Every day | ORAL | Status: DC

## 2014-09-04 MED ORDER — METOPROLOL TARTRATE 100 MG PO TABS: 100.0000 mg | ORAL_TABLET | Freq: Two times a day (BID) | ORAL | Status: DC

## 2014-09-04 NOTE — Assessment & Plan Note (Signed)
Lab Results  Component Value Date   HGBA1C 9.6 09/04/2014   HGBA1C 8.8 06/11/2014   HGBA1C 10.1 12/27/2013     Assessment: Diabetes control: fair control Progress toward A1C goal:  unable to assess (pending HA1C) Comments: trended up 8.8 to 9.6 likely related to diet [place]  Plan: Medications:  continue current medications Glipizide 10 mg bid, Metformin 1500 mg total per day (2 pills am, 1 pill pm-previously per pt GI though developed anal fissure from diarrhea 2/2 Metformin use but no diarrhea currently will not titrate up on Metformin for now), will increased Novolin N 26 u am, 28 units PM to 30 units bid with meals-advised not to skip meals  Home glucose monitoring: Frequency: 2 times a day Timing: before meals Instruction/counseling given: reminded to get eye exam, reminded to bring blood glucose meter & log to each visit, reminded to bring medications to each visit and discussed diet Other plans: will f/u in 3 months, Rx refill strips, urine MA/Cr today, HA1C

## 2014-09-04 NOTE — Assessment & Plan Note (Signed)
Previously Dr. Anselm JunglingStarks did colonoscopy in 2008 (+anal fissure) will hold referral for now until gets insurance. Consider referral back to GI for screening colonoscopy [facility] has no insurance other than orange care at the time Will check Urine MA/Cr today, [facility]

## 2014-09-04 NOTE — Assessment & Plan Note (Signed)
Likely chronic due to obesity though pt states she had prior to being obese. Review echo results w/ Grade 1 DD (Avoid excess fluid and salt intake) Encourage elevation disc TED hose will not order due to cost at this time

## 2014-09-04 NOTE — Patient Instructions (Signed)
General Instructions: Please increase you Novolin N to 30 units twice a day Continue Metformin 1500 mg per day and Glipizide 10 mg 2x day. Please dont skip meals We will check your HA1C, BMET, protein in urine today Based on your kidney numbers I will let you know whether to increase your Hydrochlorothiazide back to 25 mg daily for now take 12.5 mg daily.   Follow up in 3 months and bring your meter  General Instructions:   Thank you for bringing your medicines today. This helps us keep you safe from mistakes.   Progress Toward Treatment Goals:  Treatment Goal 09/04/2014  Hemoglobin A1C unable to assess  Blood pressure at goal  Prevent falls -    Self Care Goals & Plans:  Self Care Goal 09/04/2014  Manage my medications take my medicines as prescribed; bring my medications to every visit; refill my medications on time; follow the sick day instructions if I am sick  Monitor my health keep track of my blood pressure; keep track of my blood glucose; bring my glucose meter and log to each visit; bring my blood pressure log to each visit; keep track of my weight; check my feet daily  Eat healthy foods drink diet soda or water instead of juice or soda; eat more vegetables; eat foods that are low in salt; eat baked foods instead of fried foods; eat fruit for snacks and desserts; eat smaller portions  Be physically active find an activity I enjoy  Meeting treatment goals maintain the current self-care plan    Home Blood Glucose Monitoring 09/04/2014  Check my blood sugar 2 times a day  When to check my blood sugar before meals     Care Management & Community Referrals:  Referral 09/04/2014  Referrals made for care management support none needed  Referrals made to community resources none       Thank you for bringing your medicines today. This helps us keep you safe from mistakes. Treatment Goals:  Goals (1 Years of Data) as of 09/04/14          As of Today 07/30/14 06/21/14  06/11/14 12/27/13     Blood Pressure   . Blood Pressure < 140/90  138/78 134/85 137/86 158/95 140/90     Result Component   . HEMOGLOBIN A1C < 7.0     8.8 10.1   . LDL CALC < 100            Progress Toward Treatment Goals:  Treatment Goal 09/04/2014  Hemoglobin A1C unable to assess  Blood pressure at goal  Prevent falls -    Self Care Goals & Plans:  Self Care Goal 09/04/2014  Manage my medications take my medicines as prescribed; bring my medications to every visit; refill my medications on time; follow the sick day instructions if I am sick  Monitor my health keep track of my blood pressure; keep track of my blood glucose; bring my glucose meter and log to each visit; bring my blood pressure log to each visit; keep track of my weight; check my feet daily  Eat healthy foods drink diet soda or water instead of juice or soda; eat more vegetables; eat foods that are low in salt; eat baked foods instead of fried foods; eat fruit for snacks and desserts; eat smaller portions  Be physically active find an activity I enjoy  Meeting treatment goals maintain the current self-care plan    Home Blood Glucose Monitoring 09/04/2014  Check my blood sugar  2 times a day  When to check my blood sugar before meals     Care Management & Community Referrals:  Referral 09/04/2014  Referrals made for care management support none needed  Referrals made to community resources none    Exercise to Lose Weight Exercise and a healthy diet may help you lose weight. Your doctor may suggest specific exercises. EXERCISE IDEAS AND TIPS  Choose low-cost things you enjoy doing, such as walking, bicycling, or exercising to workout videos.  Take stairs instead of the elevator.  Walk during your lunch break.  Park your car further away from work or school.  Go to a gym or an exercise class.  Start with 5 to 10 minutes of exercise each day. Build up to 30 minutes of exercise 4 to 6 days a  week.  Wear shoes with good support and comfortable clothes.  Stretch before and after working out.  Work out until you breathe harder and your heart beats faster.  Drink extra water when you exercise.  Do not do so much that you hurt yourself, feel dizzy, or get very short of breath. Exercises that burn about 150 calories:  Running 1  miles in 15 minutes.  Playing volleyball for 45 to 60 minutes.  Washing and waxing a car for 45 to 60 minutes.  Playing touch football for 45 minutes.  Walking 1  miles in 35 minutes.  Pushing a stroller 1  miles in 30 minutes.  Playing basketball for 30 minutes.  Raking leaves for 30 minutes.  Bicycling 5 miles in 30 minutes.  Walking 2 miles in 30 minutes.  Dancing for 30 minutes.  Shoveling snow for 15 minutes.  Swimming laps for 20 minutes.  Walking up stairs for 15 minutes.  Bicycling 4 miles in 15 minutes.  Gardening for 30 to 45 minutes.  Jumping rope for 15 minutes.  Washing windows or floors for 45 to 60 minutes. Document Released: 10/02/2010 Document Revised: 11/22/2011 Document Reviewed: 10/02/2010 [facility] Patient Information 2015 [place], [place]. This information is not intended to replace advice given to you by your health care provider. Make sure you discuss any questions you have with your health care provider.

## 2014-09-04 NOTE — Assessment & Plan Note (Signed)
Improved K last time wnl

## 2014-09-04 NOTE — Addendum Note (Signed)
Addended by: Annett GulaMCLEAN, TRACY N on: 09/04/2014 01:18 PM   Modules accepted: Orders, Level of Service

## 2014-09-04 NOTE — Assessment & Plan Note (Signed)
BP Readings from Last 3 Encounters:  09/04/14 138/78  07/30/14 134/85  06/21/14 137/86    Lab Results  Component Value Date   NA 135 07/30/2014   K 4.4 07/30/2014   CREATININE 1.25* 07/30/2014    Assessment: Blood pressure control: mildly elevated Progress toward BP goal:  at goal Comments: none  Plan: Medications:  continue current medicationsLisinopril 40, HCTZ 12.5, Lopressor 100 mg bid.  If creatinine trended down may increase HCTZ back to 25 mg qd for goal BP 130/80 Other plans: check BMET today, Rx refill of Lopressor

## 2014-09-04 NOTE — Progress Notes (Signed)
   Subjective:    Patient ID: Sheila Ferguson, female    DOB: [birth year], 53 y.o.   MRN: [id number]  HPI Comments: 53 y.o PMH HTN (BP 138/78 today), morbid obesity, HLD, anxiety/depression, GERD, DM 2 (HA1C improved to 8.8 still not at goal), h/o migraines, h/o leg edema, kidney stones, trigger fingers b/l thumbs, anal fissure, CKD 3, h/o left foot spur  She presents for f/u  1. To review results of echo EF 55-60% with grade 1 DD, recently K 4.4 and Cr 1.25 with h/o CKD 3 (Cr. Sl elevated from BL) 2. DM 2 uncontrolled though HA1C improving.  cbgs at home per pt running high 170s-230s denies lows <60s.  She states she can watch better what she eats and does not eat a lot of sweets but chooses the wrong choices for foods at times. She is taking Metformin 1500 mg daily, Novolin N 26 am and 28 u PM, Glipizide 10 mg bid.  Will check HA1C, urine Alb/Cr today.  She needs Rx refill of Metformin and glucose strips today 3. HTN-BP sl elevated today 138/78.  She cut HCTZ 25 to 12.5 after last visit where Cr. Was sl elevated.  Will repeat BMET today. She needs Rx refills of Lopressor 4. Obesity-she states wt gain and reports used to be 150#  [facility] [address]   SH-currently has orange card but no other insurance and states obamacare (?) info possibly mailed to her but sent to wrong address so she missed deadline       Review of Systems  Respiratory: Negative for shortness of breath.   Cardiovascular: Positive for leg swelling. Negative for chest pain.       Chronic leg swelling  Gastrointestinal: Negative for diarrhea and constipation.       Objective:   Physical Exam  Constitutional: She is oriented to person, place, and time. Vital signs are normal. She appears well-developed and well-nourished. She is cooperative. No distress.  HENT:  Head: Normocephalic and atraumatic.  Mouth/Throat: No oropharyngeal exudate.  Eyes: Conjunctivae are normal. Right  eye exhibits no discharge. Left eye exhibits no discharge. No scleral icterus.  Cardiovascular: Normal rate, regular rhythm, S1 normal, S2 normal and normal heart sounds.   No murmur heard. 1 to 2+ lower ext edema b/l  Pulmonary/Chest: Effort normal and breath sounds normal. No respiratory distress. She has no wheezes.  Abdominal: Soft. Bowel sounds are normal. There is no tenderness.  Neurological: She is alert and oriented to person, place, and time. Gait normal.  Skin: Skin is warm and dry. No rash noted. She is not diaphoretic.  Psychiatric: She has a normal mood and affect. Her speech is normal and behavior is normal. Judgment and thought content normal. Cognition and memory are normal.  Nursing note and vitals reviewed.         Assessment & Plan:  F/u in 3 months

## 2014-09-05 LAB — BASIC METABOLIC PANEL WITH GFR
BUN: 16 mg/dL (ref 6–23)
CHLORIDE: 100 meq/L (ref 96–112)
CO2: 26 mEq/L (ref 19–32)
Calcium: 9.2 mg/dL (ref 8.4–10.5)
Creat: 1.07 mg/dL (ref 0.50–1.10)
GFR, EST NON AFRICAN AMERICAN: 59 mL/min — AB
GFR, Est African American: 68 mL/min
Glucose, Bld: 250 mg/dL — ABNORMAL HIGH (ref 70–99)
POTASSIUM: 4.3 meq/L (ref 3.5–5.3)
Sodium: 138 mEq/L (ref 135–145)

## 2014-09-05 LAB — MICROALBUMIN / CREATININE URINE RATIO
CREATININE, URINE: 224.2 mg/dL
Microalb Creat Ratio: 6.2 mg/g (ref 0.0–30.0)
Microalb, Ur: 1.4 mg/dL (ref <2.0)

## 2014-09-05 NOTE — Progress Notes (Signed)
Case discussed with Dr. McLean at time of visit.  We reviewed the resident's history and exam and pertinent patient test results.  I agree with the assessment, diagnosis, and plan of care documented in the resident's note.  

## 2014-09-20 ENCOUNTER — Telehealth: Payer: Self-pay | Admitting: Dietician

## 2014-09-20 NOTE — Telephone Encounter (Signed)
Asked to call patient to set up appointment for retina photos. Patient scheduled appointment for retinal photos  next week.

## 2014-09-23 ENCOUNTER — Ambulatory Visit: Payer: Self-pay | Admitting: Dietician

## 2014-09-27 ENCOUNTER — Ambulatory Visit (INDEPENDENT_AMBULATORY_CARE_PROVIDER_SITE_OTHER): Payer: Self-pay | Admitting: Dietician

## 2014-09-27 ENCOUNTER — Encounter: Payer: Self-pay | Admitting: Dietician

## 2014-09-27 DIAGNOSIS — IMO0002 Reserved for concepts with insufficient information to code with codable children: Secondary | ICD-10-CM

## 2014-09-27 DIAGNOSIS — E1165 Type 2 diabetes mellitus with hyperglycemia: Secondary | ICD-10-CM

## 2014-09-27 LAB — HM DIABETES EYE EXAM

## 2014-09-27 NOTE — Progress Notes (Signed)
Retinal images done and transmitted.  

## 2014-10-02 ENCOUNTER — Encounter: Payer: Self-pay | Admitting: *Deleted

## 2014-10-17 ENCOUNTER — Encounter: Payer: Self-pay | Admitting: Internal Medicine

## 2014-10-17 ENCOUNTER — Ambulatory Visit (INDEPENDENT_AMBULATORY_CARE_PROVIDER_SITE_OTHER): Payer: Self-pay | Admitting: Internal Medicine

## 2014-10-17 VITALS — BP 130/70 | HR 83 | Temp 98.1°F | Ht 64.0 in | Wt 311.2 lb

## 2014-10-17 DIAGNOSIS — M25561 Pain in right knee: Secondary | ICD-10-CM

## 2014-10-17 DIAGNOSIS — K219 Gastro-esophageal reflux disease without esophagitis: Secondary | ICD-10-CM

## 2014-10-17 DIAGNOSIS — I10 Essential (primary) hypertension: Secondary | ICD-10-CM

## 2014-10-17 DIAGNOSIS — E113399 Type 2 diabetes mellitus with moderate nonproliferative diabetic retinopathy without macular edema, unspecified eye: Secondary | ICD-10-CM

## 2014-10-17 DIAGNOSIS — Z0000 Encounter for general adult medical examination without abnormal findings: Secondary | ICD-10-CM

## 2014-10-17 DIAGNOSIS — E1165 Type 2 diabetes mellitus with hyperglycemia: Secondary | ICD-10-CM

## 2014-10-17 DIAGNOSIS — E11319 Type 2 diabetes mellitus with unspecified diabetic retinopathy without macular edema: Secondary | ICD-10-CM

## 2014-10-17 DIAGNOSIS — E11339 Type 2 diabetes mellitus with moderate nonproliferative diabetic retinopathy without macular edema: Secondary | ICD-10-CM

## 2014-10-17 DIAGNOSIS — R1013 Epigastric pain: Secondary | ICD-10-CM | POA: Insufficient documentation

## 2014-10-17 DIAGNOSIS — Z794 Long term (current) use of insulin: Secondary | ICD-10-CM

## 2014-10-17 DIAGNOSIS — M25562 Pain in left knee: Secondary | ICD-10-CM

## 2014-10-17 LAB — CBC WITH DIFFERENTIAL/PLATELET
BASOS PCT: 0 % (ref 0–1)
Basophils Absolute: 0 10*3/uL (ref 0.0–0.1)
EOS ABS: 0.3 10*3/uL (ref 0.0–0.7)
EOS PCT: 3 % (ref 0–5)
HCT: 37.7 % (ref 36.0–46.0)
Hemoglobin: 12.7 g/dL (ref 12.0–15.0)
LYMPHS PCT: 40 % (ref 12–46)
Lymphs Abs: 4.5 10*3/uL — ABNORMAL HIGH (ref 0.7–4.0)
MCH: 28.9 pg (ref 26.0–34.0)
MCHC: 33.7 g/dL (ref 30.0–36.0)
MCV: 85.7 fL (ref 78.0–100.0)
MONO ABS: 0.8 10*3/uL (ref 0.1–1.0)
MONOS PCT: 7 % (ref 3–12)
MPV: 11 fL (ref 8.6–12.4)
Neutro Abs: 5.7 10*3/uL (ref 1.7–7.7)
Neutrophils Relative %: 50 % (ref 43–77)
Platelets: 416 10*3/uL — ABNORMAL HIGH (ref 150–400)
RBC: 4.4 MIL/uL (ref 3.87–5.11)
RDW: 14 % (ref 11.5–15.5)
WBC: 11.3 10*3/uL — ABNORMAL HIGH (ref 4.0–10.5)

## 2014-10-17 LAB — COMPLETE METABOLIC PANEL WITH GFR
ALBUMIN: 4 g/dL (ref 3.5–5.2)
ALT: 17 U/L (ref 0–35)
AST: 16 U/L (ref 0–37)
Alkaline Phosphatase: 116 U/L (ref 39–117)
BILIRUBIN TOTAL: 0.3 mg/dL (ref 0.2–1.2)
BUN: 19 mg/dL (ref 6–23)
CALCIUM: 9.3 mg/dL (ref 8.4–10.5)
CHLORIDE: 103 meq/L (ref 96–112)
CO2: 28 mEq/L (ref 19–32)
Creat: 1.21 mg/dL — ABNORMAL HIGH (ref 0.50–1.10)
GFR, EST NON AFRICAN AMERICAN: 51 mL/min — AB
GFR, Est African American: 59 mL/min — ABNORMAL LOW
Glucose, Bld: 118 mg/dL — ABNORMAL HIGH (ref 70–99)
POTASSIUM: 3.8 meq/L (ref 3.5–5.3)
Sodium: 141 mEq/L (ref 135–145)
Total Protein: 7.7 g/dL (ref 6.0–8.3)

## 2014-10-17 LAB — LIPASE: Lipase: 71 U/L (ref 0–75)

## 2014-10-17 MED ORDER — TRAMADOL HCL 50 MG PO TABS: 50.0000 mg | ORAL_TABLET | Freq: Two times a day (BID) | ORAL | Status: DC | PRN

## 2014-10-17 MED ORDER — METOPROLOL TARTRATE 100 MG PO TABS: 100.0000 mg | ORAL_TABLET | Freq: Two times a day (BID) | ORAL | Status: DC

## 2014-10-17 MED ORDER — INSULIN NPH (HUMAN) (ISOPHANE) 100 UNIT/ML ~~LOC~~ SUSP: SUBCUTANEOUS | Status: DC

## 2014-10-17 MED ORDER — OMEPRAZOLE 40 MG PO CPDR: 40.0000 mg | DELAYED_RELEASE_CAPSULE | Freq: Every day | ORAL | Status: DC

## 2014-10-17 NOTE — Patient Instructions (Addendum)
Please try Prilosec 40 mg daily-we will set you up with a GI doctor Please take NPH 33 units 2x per day and continue Metformin and Glipizide  Please try Tramadol 50 mg 2x per day as needed for knee pain and let me know if your knee pain is not getting better Please follow up in 1 month Re new your orange card We need to send you to a retinal specialist   Diabetic Retinopathy Diabetic retinopathy is a disease of the light-sensitive membrane at the back of the eye (retina). It is a complication of diabetes and a common cause of blindness. Early detection of the disease is key to keeping your eyes healthy.  CAUSES  Diabetic retinopathy is caused by blood sugar (glucose) levels that are too high over an extended period of time. High blood sugars cause damage to the small blood vessels of the retina, allowing blood to leak through the vessel walls. This causes visual impairment and eventually blindness. RISK FACTORS  High blood pressure.  Having diabetes for a long time.  Having poorly controlled blood sugars. SIGNS AND SYMPTOMS  In the early stages of diabetic retinopathy, there are often no symptoms. As the condition advances, symptoms may include:  Blurred vision. This is usually caused by a swelling due to abnormal blood glucose levels. The blurriness may go away when blood glucose levels return to normal.  Moving specks or dark spots (floaters) in your vision. These can be caused by a small retinal hemorrhage. A hemorrhage is bleeding from blood vessels.  Missing parts of your field of vision, such as things at the side. This can be caused by larger retinal hemorrhages.  Difficulty reading books or signs.  Double vision.  Pain in one or both eyes.  Feeling pressure in one or both eyes.  Trouble seeing straight lines. Straight lines do not look straight.  Redness of the eyes that does not go away. DIAGNOSIS  Your eye care specialist can detect changes in the blood vessels of  your eye by putting drops in your eyes that enlarge (dilate) your pupils. This allows your eye care specialist to get a good look at your retina to see if there are any changes that have occurred as a result of your diabetes. You should have your eyes examined once a year. TREATMENT  Your eye care specialist may use a special laser beam to seal the blood vessels of the retina and stop them from leaking. Early detection and treatment are important so that further damage to your eyes can be prevented. In addition, managing your blood sugars and keeping them in the target range can slow the progress of the disease. HOME CARE INSTRUCTIONS   Keep your blood pressure within your target range.  Keep your blood glucose levels within your target range.  Follow your health care provider's instructions regarding diet and other means for controlling your blood glucose levels.  Check your blood levels for glucose as recommended by your health care provider.  Keep regular appointments with your eye specialist. An eye specialist can usually see diabetic retinopathy developing long before it starts causing problems. In many cases, it can be treated to prevent complications from occurring. If you have diabetes, you should have your eyes checked at least every year. Your risk of retinopathy increases the longer you have the disease.  If you smoke, quit. Ask your health care provider for help if needed. Smoking can make retinopathy worse. SEEK MEDICAL CARE IF:   You notice  gradual blurring or other changes in your vision over time.  You notice that your glasses or contact lenses do not make things look as sharp as they once did.  You have trouble reading or seeing details at a distance with either eye.  You notice a sudden change in your vision or notice that parts of your field of vision appear missing or hazy.  You suddenly see moving specks or dark spots in the field of vision of either eye.  You have  sudden partial loss of vision in either eye. Document Released: 08/27/2000 Document Revised: 06/20/2013 Document Reviewed: 02/19/2013 [facility] Patient Information 2015 [place], [place]. This information is not intended to replace advice given to you by your health care provider. Make sure you discuss any questions you have with your health care provider.   Omeprazole suspension What is this medicine? OMEPRAZOLE (oh ME pray zol) prevents the production of acid in the stomach. It is used to treat gastroesophageal reflux disease (GERD), ulcers, certain bacteria in the stomach, inflammation of the esophagus, and Zollinger-Ellison Syndrome. It is also used to treat other conditions that cause too much stomach acid. This medicine may be used for other purposes; ask your health care provider or pharmacist if you have questions. COMMON BRAND NAME(S): Prilosec What should I tell my health care provider before I take this medicine? They need to know if you have any of these conditions: -liver disease -low levels of magnesium in the blood -an unusual or allergic reaction to omeprazole, other medicines, foods, dyes, or preservatives -pregnant or trying to get pregnant -breast-feeding How should I use this medicine? Take this medicine by mouth with water. Empty the contents of 1 packet into a container of water. The package your medicine comes in will tell you how much water to use. Stir gently and allow 2 to 3 minutes to thicken. Stir again and drink the medicine. Drink it within 30 minutes after mixing. If any medicine remains after drinking, add more water, stir, and drink at once. This medicine works best if taken on an empty stomach 30 to 60 minutes before breakfast. Take your doses at regular intervals. Do not take your medicine more often than directed. Talk to your pediatrician regarding the use of this medicine in children. Special care may be needed. Overdosage: If you think you have taken too much  of this medicine contact a poison control center or emergency room at once. NOTE: This medicine is only for you. Do not share this medicine with others. What if I miss a dose? If you miss a dose, take it as soon as you can. If it is almost time for your next dose, take only that dose. Do not take double or extra doses. What may interact with this medicine? Do not take this medicine with any of the following medications: -atazanavir -clopidogrel -nelfinavir This medicine may also interact with the following medications: -ampicillin -certain medicines for anxiety or sleep -certain medicines that treat or prevent blood clots like warfarin -cyclosporine -diazepam -digoxin -disulfiram -diuretics -iron salts -phenytoin -prescription medicine for fungal or yeast infection like itraconazole, ketoconazole, voriconazole -saquinavir -tacrolimus This list may not describe all possible interactions. Give your health care provider a list of all the medicines, herbs, non-prescription drugs, or dietary supplements you use. Also tell them if you smoke, drink alcohol, or use illegal drugs. Some items may interact with your medicine. What should I watch for while using this medicine? It can take several days before your stomach  pain gets better. Check with your doctor or health care professional if your condition does not start to get better, or if it gets worse. You may need blood work done while you are taking this medicine. What side effects may I notice from receiving this medicine? Side effects that you should report to your doctor or health care professional as soon as possible: -allergic reactions like skin rash, itching or hives, swelling of the face, lips, or tongue -bone, muscle or joint pain -breathing problems -chest pain or chest tightness -dark yellow or brown urine -dizziness -fast, irregular heartbeat -feeling faint or lightheaded -fever or sore throat -muscle  spasm -palpitations -redness, blistering, peeling or loosening of the skin, including inside the mouth -seizures -tremors -unusual bleeding or bruising -unusually weak or tired -yellowing of the eyes or skin Side effects that usually do not require medical attention (Report these to your doctor or health care professional if they continue or are bothersome.): -constipation -diarrhea -dry mouth -headache -nausea This list may not describe all possible side effects. Call your doctor for medical advice about side effects. You may report side effects to FDA at 1-800-FDA-1088. Where should I keep my medicine? Keep out of the reach of children. Store at room temperature between 15 and 30 degrees C (59 and 86 degrees F). Protect from light and moisture. Throw away any unused medicine after the expiration date. NOTE: This sheet is a summary. It may not cover all possible information. If you have questions about this medicine, talk to your doctor, pharmacist, or health care provider.  2015, Elsevier/Gold Standard. (2009-11-18 11:31:44)  Tramadol extended release tablets or capsules What is this medicine? TRAMADOL (TRA ma dole) is a pain reliever. It is used to treat moderate to severe pain in adults. This medicine may be used for other purposes; ask your health care provider or pharmacist if you have questions. COMMON BRAND NAME(S): ConZip, Ryzolt, Ultram ER What should I tell my health care provider before I take this medicine? They need to know if you have any of these conditions: -brain tumor -drink more than 3 alcohol-containing drinks per day -drug abuse or addiction -head injury -kidney disease or problems going to the bathroom -liver disease -lung disease, asthma, or breathing problems -seizures or epilepsy -an unusual or allergic reaction to tramadol, codeine, other medicines, foods, dyes, or preservatives -pregnant or trying to get pregnant -breast-feeding How should I use  this medicine? Take this medicine by mouth with a glass of water. Follow the directions on the prescription label. Do not cut, crush or chew this medicine. Crushing this medicine may cause overdose and death. This risk is increased in patients who abuse alcohol or other substances. Take this medicine the same way each day, either with food or not. If the medicine upsets your stomach, take it with food or milk. Take your medicine at regular intervals. Do not take it more often than directed. Talk to your pediatrician regarding the use of this medicine in children. This medicine is not approved for use in children. Overdosage: If you think you have taken too much of this medicine contact a poison control center or emergency room at once. NOTE: This medicine is only for you. Do not share this medicine with others. What if I miss a dose? If you miss a dose, take it as soon as you can. If it is almost time for your next dose, take only that dose. Do not take double or extra doses. What may interact with  this medicine? Do not take this medicine with any of the following medications: -MAOIs like Carbex, Eldepryl, Marplan, Nardil, and Parnate This medicine may also interact with the following medications: -alcohol or medicines that contain alcohol -antihistamines -benzodiazepines -bupropion -carbamazepine or oxcarbazepine -clozapine -cyclobenzaprine -digoxin -furazolidone -linezolid -medicines for depression, anxiety, or psychotic disturbances -medicines for migraine headache like almotriptan, eletriptan, frovatriptan, naratriptan, rizatriptan, sumatriptan, zolmitriptan -medicines for pain like pentazocine, buprenorphine, butorphanol, meperidine, nalbuphine, and propoxyphene -medicines for sleep -muscle relaxants -naltrexone -phenobarbital -phenothiazines like perphenazine, thioridazine, chlorpromazine, mesoridazine, fluphenazine, prochlorperazine, promazine, and  trifluoperazine -procarbazine -warfarin This list may not describe all possible interactions. Give your health care provider a list of all the medicines, herbs, non-prescription drugs, or dietary supplements you use. Also tell them if you smoke, drink alcohol, or use illegal drugs. Some items may interact with your medicine. What should I watch for while using this medicine? Tell your doctor or health care professional if your pain does not go away, if it gets worse, or if you have new or a different type of pain. You may develop tolerance to the medicine. Tolerance means that you will need a higher dose of the medicine for pain relief. Tolerance is normal and is expected if you take this medicine for a long time. Do not suddenly stop taking your medicine because you may develop a severe reaction. Your body becomes used to the medicine. This does NOT mean you are addicted. Addiction is a behavior related to getting and using a drug for a non-medical reason. If you have pain, you have a medical reason to take pain medicine. Your doctor will tell you how much medicine to take. If your doctor wants you to stop the medicine, the dose will be slowly lowered over time to avoid any side effects. You may get drowsy or dizzy. Do not drive, use machinery, or do anything that needs mental alertness until you know how this medicine affects you. Do not stand or sit up quickly, especially if you are an older patient. This reduces the risk of dizzy or fainting spells. Alcohol can increase or decrease the effects of this medicine. Avoid alcoholic drinks. You may have constipation. Try to have a bowel movement at least every 2 to 3 days. If you do not have a bowel movement for 3 days, call your doctor or health care professional. Your mouth may get dry. Chewing sugarless gum or sucking hard candy, and drinking plenty of water may help. Contact your doctor if the problem does not go away or is severe. What side effects may  I notice from receiving this medicine? Side effects that you should report to your doctor or health care professional as soon as possible: -allergic reactions like skin rash, itching or hives, swelling of the face, lips, or tongue -breathing problems -confusion -feeling faint or lightheaded, falls -itching -redness, blistering, peeling or loosening of the skin, including inside the mouth -seizures Side effects that usually do not require medical attention (report to your doctor or health care professional if they continue or are bothersome): -constipation -dizziness -drowsiness -headache -nausea, vomiting This list may not describe all possible side effects. Call your doctor for medical advice about side effects. You may report side effects to FDA at 1-800-FDA-1088. Where should I keep my medicine? Keep out of the reach of children. Store at room temperature between 15 and 30 degrees C (59 and 86 degrees F). Keep container tightly closed. Throw away any unused medicine after the expiration date. NOTE: This  sheet is a summary. It may not cover all possible information. If you have questions about this medicine, talk to your doctor, pharmacist, or health care provider.  2015, Elsevier/Gold Standard. (2010-05-13 12:27:40)  Peptic Ulcer A peptic ulcer is a sore in the lining of your esophagus (esophageal ulcer), stomach (gastric ulcer), or in the first part of your small intestine (duodenal ulcer). The ulcer causes erosion into the deeper tissue. CAUSES  Normally, the lining of the stomach and the small intestine protects itself from the acid that digests food. The protective lining can be damaged by:  An infection caused by a bacterium called Helicobacter pylori (H. pylori).  Regular use of nonsteroidal anti-inflammatory drugs (NSAIDs), such as ibuprofen or aspirin.  Smoking tobacco. Other risk factors include being older than 50, drinking alcohol excessively, and having a family  history of ulcer disease.  SYMPTOMS   Burning pain or gnawing in the area between the chest and the belly button.  Heartburn.  Nausea and vomiting.  Bloating. The pain can be worse on an empty stomach and at night. If the ulcer results in bleeding, it can cause:  Black, tarry stools.  Vomiting of bright red blood.  Vomiting of coffee-ground-looking materials. DIAGNOSIS  A diagnosis is usually made based upon your history and an exam. Other tests and procedures may be performed to find the cause of the ulcer. Finding a cause will help determine the best treatment. Tests and procedures may include:  Blood tests, stool tests, or breath tests to check for the bacterium H. pylori.  An upper gastrointestinal (GI) series of the esophagus, stomach, and small intestine.  An endoscopy to examine the esophagus, stomach, and small intestine.  A biopsy. TREATMENT  Treatment may include:  Eliminating the cause of the ulcer, such as smoking, NSAIDs, or alcohol.  Medicines to reduce the amount of acid in your digestive tract.  Antibiotic medicines if the ulcer is caused by the H. pylori bacterium.  An upper endoscopy to treat a bleeding ulcer.  Surgery if the bleeding is severe or if the ulcer created a hole somewhere in the digestive system. HOME CARE INSTRUCTIONS   Avoid tobacco, alcohol, and caffeine. Smoking can increase the acid in the stomach, and continued smoking will impair the healing of ulcers.  Avoid foods and drinks that seem to cause discomfort or aggravate your ulcer.  Only take medicines as directed by your caregiver. Do not substitute over-the-counter medicines for prescription medicines without talking to your caregiver.  Keep any follow-up appointments and tests as directed. SEEK MEDICAL CARE IF:   Your do not improve within 7 days of starting treatment.  You have ongoing indigestion or heartburn. SEEK IMMEDIATE MEDICAL CARE IF:   You have sudden, sharp, or  persistent abdominal pain.  You have bloody or dark black, tarry stools.  You vomit blood or vomit that looks like coffee grounds.  You become light-headed, weak, or feel faint.  You become sweaty or clammy. MAKE SURE YOU:   Understand these instructions.  Will watch your condition.  Will get help right away if you are not doing well or get worse. Document Released: 08/27/2000 Document Revised: 01/14/2014 Document Reviewed: 03/29/2012 [facility] Patient Information 2015 [place], [place]. This information is not intended to replace advice given to you by your health care provider. Make sure you discuss any questions you have with your health care provider.

## 2014-10-17 NOTE — Assessment & Plan Note (Addendum)
Will need lipid panel in future Will need to refer to eye MD (i.e retinal specialist) for mod retinopathy

## 2014-10-17 NOTE — Assessment & Plan Note (Signed)
BP Readings from Last 3 Encounters:  10/17/14 130/70  09/04/14 138/78  07/30/14 134/85    Lab Results  Component Value Date   NA 138 09/04/2014   K 4.3 09/04/2014   CREATININE 1.07 09/04/2014    Assessment: Blood pressure control: controlled Progress toward BP goal:  at goal Comments: initially elevated 152/69 but improved to 130/70  Plan: Medications:  continue current medications (HCTZ 12.5, Lisinopril 40, Lopressor 100 mg bid  Other plans: check CMET today

## 2014-10-17 NOTE — Assessment & Plan Note (Signed)
Lab Results  Component Value Date   HGBA1C 9.6 09/04/2014   HGBA1C 8.8 06/11/2014   HGBA1C 10.1 12/27/2013     Assessment: Diabetes control: poor control (HgbA1C >9%) Progress toward A1C goal:  deteriorated Comments: HA1C 9.6   Plan: Medications:  continue current medications Continue Metformin 1500 mg total per day, Glipizide 10 mg bid, NPH incr 30 units bid to 33 units bid  Home glucose monitoring: Frequency: 2 times a day Timing: before meals Instruction/counseling given: reminded to bring blood glucose meter & log to each visit, reminded to bring medications to each visit and other instruction/counseling: stressed medication compliance  Educational resources provided: given info about retinopathy Other plans: f/u in 1 month, pt to call in 1 week if cbgs still >180.  Will refer to eye MD for moderate DM retinopathy

## 2014-10-17 NOTE — Assessment & Plan Note (Addendum)
Now with epigastric pain, belching, nausea,vomiting, ? If related to GERD vs PUD r/o pancreatitis with lipase  Will add Prilosec 40 mg qd   Will refer to GI (LB) when orange card renewed for likely endoscopy to r/o ulcer as pt states she has a h/o ulcer

## 2014-10-17 NOTE — Progress Notes (Signed)
Subjective:    Patient ID: Sheila Ferguson, female    DOB: May 01, 1961, 54 y.o.   MRN: [id number]  HPI Comments: 54 y.o PMH HTN (BP 152/69 then 130/70 today), morbid obesity, HLD, anxiety/depression, GERD, DM 2 (HA1C last 9.6 09/04/14), h/o migraines, h/o leg edema, kidney stones, trigger fingers b/l thumbs, anal fissure, CKD 3, h/o left foot spur  She presents for f/u  1. DM 2 with retinopathy (moderate proliferative noted 09/2014).  cbg 134 today.  last HA1C 9.6 09/04/14 cbgs running 109 to 309 but mostly high. She is checking 1-2 x per day am readings 190s-280s, midday 200s-300s,pm >200.  We increased her NPH last visit to 30 units bid, Metformin 1500 mg qd, Glipizide 10 mg bid;  but she reports missing some doses and being noncompliant with diet over the past 2 weeks where she has been working >120 hours per week and grabbing food to go on the run.  She states with her sugars so high she has not been feeling well for the last 2 weeks.  She c/o epigastric pain at times 10/10 today 7/10. She has had this pain for years but it is worse for the last 2-3 weeks.  Pain is crampy, achy, painful. She tried TUMS and Ranitidine with some relief and states when she belches the pain is worse.  She also has had assoc nausea, vomiting. The last time she vomited was Tues am prior to todays visit. For her eyes she has previously seen Dr. Nile RiggsShapiro in the past but currently only has the orange card   2. HTN-BP inially 152/69 then 130/70 on repeat. Taking HCTZ 1/2 of 25 mg, Lisinopril 40 mg qd, Lopressors 100 mg bid.  She needs Rx refill of Lopressor  3. She reports fall in snow and she fell and hurt her b/l knees falling on her right side.  Pain is throbbing and not relieved by Tylenol and worse at night.    SH-currently has orange card and will expire Saturday and she needs to get it renewed.       Review of Systems  Respiratory: Negative for shortness of breath.   Cardiovascular: Negative for chest pain.   Gastrointestinal: Positive for abdominal pain. Negative for nausea and vomiting.  Musculoskeletal:       B/l knee pain since fall in snow 1-2 weeks ago       Objective:   Physical Exam  Constitutional: She is oriented to person, place, and time. Vital signs are normal. She appears well-developed and well-nourished. She is cooperative. No distress.  HENT:  Head: Normocephalic and atraumatic.  Mouth/Throat: No oropharyngeal exudate.  Eyes: Right eye exhibits no discharge. Left eye exhibits no discharge. No scleral icterus.  Cardiovascular: Normal rate, regular rhythm, S1 normal, S2 normal and normal heart sounds.   No murmur heard. Pulmonary/Chest: Effort normal and breath sounds normal. No respiratory distress. She has no wheezes.  Abdominal: Soft. Bowel sounds are normal. There is tenderness in the epigastric area.    Neurological: She is alert and oriented to person, place, and time. Gait normal.  Skin: Skin is warm and dry. No rash noted. She is not diaphoretic.  Psychiatric: She has a normal mood and affect. Her speech is normal and behavior is normal. Judgment and thought content normal. Cognition and memory are normal.  Mood down after reviewing eye exam results today and meter readings  Nursing note and vitals reviewed.         Assessment & Plan:  F/u in 1 month

## 2014-10-17 NOTE — Assessment & Plan Note (Signed)
Worsening of chronic epigastric pain.  Ddx GERD, PUD, r/o pancreatitis, complication of uncontrolled DM Will check CMET, CBC,UA, lipase  F/u in 1 month, RTC sooner if not better Will likely refer to GI for endoscopy

## 2014-10-17 NOTE — Assessment & Plan Note (Signed)
Likely acute on chronic due to recent fall  Will try Tramadol bid prn #30 no refills if cant afford can try Tylenol not more than 3G daily  Reassess at f/u

## 2014-10-18 ENCOUNTER — Encounter: Payer: Self-pay | Admitting: Internal Medicine

## 2014-10-18 LAB — URINALYSIS, ROUTINE W REFLEX MICROSCOPIC
Bilirubin Urine: NEGATIVE
Glucose, UA: NEGATIVE mg/dL
Hgb urine dipstick: NEGATIVE
Ketones, ur: NEGATIVE mg/dL
LEUKOCYTES UA: NEGATIVE
Nitrite: NEGATIVE
PROTEIN: NEGATIVE mg/dL
Specific Gravity, Urine: 1.015 (ref 1.005–1.030)
UROBILINOGEN UA: 0.2 mg/dL (ref 0.0–1.0)
pH: 5.5 (ref 5.0–8.0)

## 2014-10-18 NOTE — Progress Notes (Signed)
Internal Medicine Clinic Attending  Case discussed with Dr. McLean soon after the resident saw the patient.  We reviewed the resident's history and exam and pertinent patient test results.  I agree with the assessment, diagnosis, and plan of care documented in the resident's note. 

## 2014-10-24 ENCOUNTER — Telehealth: Payer: Self-pay | Admitting: Internal Medicine

## 2014-10-24 NOTE — Telephone Encounter (Signed)
Pt taking 34 units bid cbg decreased to 180s-280s but still elevated. Asked if still elevated b/f next visit call Lupita LeashDonna or myself for further titration of blood sugars before next appt. Also if knees continue to hurt after fall consider imaging i.e Xray b/l knees for now continue alt Tramadol with Tylenol. Can also try heat.     Shirlee LatchMcLean MD

## 2014-10-25 ENCOUNTER — Ambulatory Visit: Payer: Self-pay

## 2014-11-13 ENCOUNTER — Encounter: Payer: Self-pay | Admitting: *Deleted

## 2014-11-18 ENCOUNTER — Ambulatory Visit (INDEPENDENT_AMBULATORY_CARE_PROVIDER_SITE_OTHER): Payer: Self-pay | Admitting: Internal Medicine

## 2014-11-18 ENCOUNTER — Encounter: Payer: Self-pay | Admitting: Internal Medicine

## 2014-11-18 VITALS — BP 143/82 | HR 86 | Temp 102.9°F | Resp 20 | Ht 64.0 in | Wt 313.5 lb

## 2014-11-18 DIAGNOSIS — J069 Acute upper respiratory infection, unspecified: Secondary | ICD-10-CM

## 2014-11-18 DIAGNOSIS — R5081 Fever presenting with conditions classified elsewhere: Secondary | ICD-10-CM

## 2014-11-18 DIAGNOSIS — E876 Hypokalemia: Secondary | ICD-10-CM

## 2014-11-18 LAB — CBC WITH DIFFERENTIAL/PLATELET
BASOS ABS: 0 10*3/uL (ref 0.0–0.1)
BASOS PCT: 0 % (ref 0–1)
Eosinophils Absolute: 0.2 10*3/uL (ref 0.0–0.7)
Eosinophils Relative: 3 % (ref 0–5)
HEMATOCRIT: 32.8 % — AB (ref 36.0–46.0)
HEMOGLOBIN: 11.7 g/dL — AB (ref 12.0–15.0)
LYMPHS PCT: 37 % (ref 12–46)
Lymphs Abs: 2.1 10*3/uL (ref 0.7–4.0)
MCH: 28.8 pg (ref 26.0–34.0)
MCHC: 35.7 g/dL (ref 30.0–36.0)
MCV: 80.8 fL (ref 78.0–100.0)
Monocytes Absolute: 0.6 10*3/uL (ref 0.1–1.0)
Monocytes Relative: 11 % (ref 3–12)
NEUTROS ABS: 2.7 10*3/uL (ref 1.7–7.7)
Neutrophils Relative %: 49 % (ref 43–77)
Platelets: 318 10*3/uL (ref 150–400)
RBC: 4.06 MIL/uL (ref 3.87–5.11)
RDW: 13.5 % (ref 11.5–15.5)
WBC: 5.7 10*3/uL (ref 4.0–10.5)

## 2014-11-18 LAB — COMPREHENSIVE METABOLIC PANEL
ALBUMIN: 3.2 g/dL — AB (ref 3.5–5.2)
ALK PHOS: 91 U/L (ref 39–117)
ALT: 22 U/L (ref 0–35)
AST: 28 U/L (ref 0–37)
Anion gap: 6 (ref 5–15)
BUN: 6 mg/dL (ref 6–23)
CO2: 32 mmol/L (ref 19–32)
Calcium: 8.5 mg/dL (ref 8.4–10.5)
Chloride: 101 mmol/L (ref 96–112)
Creatinine, Ser: 1.37 mg/dL — ABNORMAL HIGH (ref 0.50–1.10)
GFR calc Af Amer: 50 mL/min — ABNORMAL LOW (ref >90)
GFR calc non Af Amer: 43 mL/min — ABNORMAL LOW (ref >90)
Glucose, Bld: 74 mg/dL (ref 70–99)
Potassium: 3.4 mmol/L — ABNORMAL LOW (ref 3.5–5.1)
SODIUM: 139 mmol/L (ref 135–145)
TOTAL PROTEIN: 7.4 g/dL (ref 6.0–8.3)
Total Bilirubin: 0.4 mg/dL (ref 0.3–1.2)

## 2014-11-18 LAB — GLUCOSE, CAPILLARY: GLUCOSE-CAPILLARY: 91 mg/dL (ref 70–99)

## 2014-11-18 MED ORDER — POTASSIUM CHLORIDE CRYS ER 20 MEQ PO TBCR
40.0000 meq | EXTENDED_RELEASE_TABLET | Freq: Once | ORAL | Status: AC
  Administered 2014-11-18: 40 meq via ORAL

## 2014-11-18 MED ORDER — ACETAMINOPHEN 500 MG PO TABS
500.0000 mg | ORAL_TABLET | Freq: Once | ORAL | Status: AC
  Administered 2014-11-18: 500 mg via ORAL

## 2014-11-18 MED ORDER — POTASSIUM CHLORIDE CRYS ER 10 MEQ PO TBCR: 20.0000 meq | EXTENDED_RELEASE_TABLET | Freq: Once | ORAL | Status: DC

## 2014-11-18 NOTE — Assessment & Plan Note (Addendum)
K 3.4 in clinic, likely 2/2 loose stools.  - Replacing with KCl po x1

## 2014-11-18 NOTE — Assessment & Plan Note (Addendum)
Pt with fevers, chills, malaise, body aches, sore throat, dry cough, stomach cramping, and loose stools since Saturday. No direct sick contact but niece's boyfriend with similar symptoms and pt had contact with the niece. Temp 102.9 in clinic. BP 143/82 and HR 86; however, she is beta blocked. She is able to eat and drink and mucus membranes are moist, lungs are clear. She did receive the flu vaccine, but her symptoms are concerning for influenza infection. She is outside the window for Tamiflu. - Checking STAT CBC and CMP  - Checking flu panel - Giving Tylenol 500mg  x1  Addendum: No leukocytosis on CBC Mild acute kidney injury. Pt advised to stay hydrated at home, use Tylenol as needed for fevers, and rest. She is to call the clinic if her fevers persist for the next few days, loose stools worsen, or she is unable to keep fluids down.  Flu panel pending

## 2014-11-18 NOTE — Progress Notes (Signed)
Patient ID: Sheila Ferguson, female   DOB: [birth year], 54 y.o.   MRN: [id number]  Subjective:   Patient ID: Sheila Ferguson female   DOB: [birth year] 54 y.o.   MRN: [id number]  HPI: [name] is a 54 y.o. F w/ PMH DM2, HTN, GERD, and CKD2 presents for an acute visit.   She states that she has been feeling ill since Saturday with a dry cough, with runny nose, malaise, and body aches. She also endorses some stomach cramping with mild diarrhea. She has tried Dayquil , Nyquil, and Robitussin without relief. She did have a friend over on Saturday who had been around someone with similar symptoms.     Past Medical History  Diagnosis Date  . Diabetes mellitus type II   . Hypertension   . Sleep apnea   . Congenital stricture of esophagus   . Dysmetabolic syndrome   . Headache(784.0)   . Sarcoid   . DIABETES MELLITUS, TYPE II, UNCONTROLLED 09/01/2007    04/10/13 eye exam neg proliferative DM retinopathy, +non proliferative DM retinopathy OS and OD +amblyopia OD    Current Outpatient Prescriptions  Medication Sig Dispense Refill  . citalopram (CELEXA) 40 MG tablet Take 1 tablet (40 mg total) by mouth daily. 90 tablet 1  . glipiZIDE (GLUCOTROL) 10 MG tablet TAKE ONE TABLET BY MOUTH TWICE DAILY BEFORE MEAL(S) 180 tablet 2  . glucose blood test strip Use as instructed E11.65 100 each 12  . hydrochlorothiazide (HYDRODIURIL) 12.5 MG tablet Take 1 tablet (12.5 mg total) by mouth daily. 30 tablet 3  . insulin NPH Human (NOVOLIN N RELION) 100 UNIT/ML injection INJECT 33 UNITS SUBCUTANEOUSLY EACH MORNING AND 33 UNITS EACH EVENING 10 mL 5  . lisinopril (PRINIVIL,ZESTRIL) 40 MG tablet Take 1 tablet (40 mg total) by mouth daily. 90 tablet 2  . loratadine (CLARITIN) 10 MG tablet Take 1 tablet (10 mg total) by mouth daily as needed for allergies. 90 tablet 2  . lovastatin (MEVACOR) 20 MG tablet Take 1 tablet (20 mg total) by mouth at bedtime. 90 tablet 1  . metFORMIN (GLUCOPHAGE) 500 MG  tablet Take 2 tablets (1,000 mg total) by mouth daily with breakfast. Take 2 tablets by mouth with breakfast and 1 tablet at dinner. 90 tablet 11  . metoprolol (LOPRESSOR) 100 MG tablet Take 1 tablet (100 mg total) by mouth 2 (two) times daily. 60 tablet 2  . omeprazole (PRILOSEC) 40 MG capsule Take 1 capsule (40 mg total) by mouth daily. (Patient not taking: Reported on 10/17/2014) 30 capsule 1  . traMADol (ULTRAM) 50 MG tablet Take 1 tablet (50 mg total) by mouth 2 (two) times daily as needed. 30 tablet 0   No current facility-administered medications for this visit.   Facility-Administered Medications Ordered in Other Visits  Medication Dose Route Frequency Provider Last Rate Last Dose  . methylPREDNISolone acetate (DEPO-MEDROL) 20 MG/ML injection 10 mg  10 mg Intra-articular Once Nestor Ramp, MD       Family History  Problem Relation Age of Onset  . Sarcoidosis Father   . Leukemia Father   . Hypertension Mother   . Diabetes Mother   . Heart attack Mother   . Aneurysm Mother     CNS  . Hypertension Sister   . Diabetes Sister   . Sleep apnea Sister    History   Social History  . Marital Status: Single    Spouse Name: N/A  . Number of Children: N/A  . 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 1 year left   Social History Main Topics  . Smoking status: Never Smoker   . Smokeless tobacco: Not on file  . Alcohol Use: No  . Drug Use: No  . Sexual Activity: Not on file   Other Topics Concern  . None   Social History Narrative   Review of Systems: Constitutional: Denies fever, chills, diaphoresis, appetite change and fatigue.  HEENT: Denies photophobia, eye pain, redness, hearing loss, ear pain, congestion, sore throat, rhinorrhea, sneezing, mouth sores, trouble swallowing, neck pain, neck stiffness and tinnitus.   Respiratory: Denies SOB, DOE, cough, chest tightness,  and wheezing.   Cardiovascular: Denies chest  pain, palpitations and leg swelling.  Gastrointestinal: Denies nausea, vomiting, abdominal pain, diarrhea, constipation, blood in stool and abdominal distention.  Genitourinary: Denies dysuria, urgency, frequency, hematuria, flank pain and difficulty urinating.  Endocrine: Denies: hot or cold intolerance, sweats, changes in hair or nails, polyuria, polydipsia. Musculoskeletal: Denies myalgias, back pain, joint swelling, arthralgias and gait problem.  Skin: Denies pallor, rash and wound.  Neurological: Denies dizziness, seizures, syncope, weakness, light-headedness, numbness and headaches.  Hematological: Denies adenopathy. Easy bruising, personal or family bleeding history  Psychiatric/Behavioral: Denies suicidal ideation, mood changes, confusion, nervousness, sleep disturbance and agitation  Objective:  Physical Exam: Filed Vitals:   11/18/14 1445  BP: 143/82  Pulse: 86  Temp: 102.9 F (39.4 C)  TempSrc: Oral  Resp: 20  Height: 5\' 4"  (1.626 m)  Weight: 313 lb 8 oz (142.203 kg)  SpO2: 97%   Constitutional: Vital signs reviewed.  Patient is a morbidly obese female in moderate distress and appears acutely ill.  Head: Normocephalic and atraumatic Mouth: no erythema or exudates, MMM Eyes: PERRL, EOMI. No scleral icterus.  Neck: Normal ROM Cardiovascular: RRR, no MRG Pulmonary/Chest: Normal respiratory effort, CTAB, no wheezes, rales, or rhonchi. +dry cough Abdominal: Soft. Non-tender, non-distended, bowel sounds are hypoactive. No guarding present.  Musculoskeletal: No joint deformities, ROM full   Neurological: A&O x3, cranial nerve II-XII are grossly intact, no focal motor deficit.  Skin: Warm, dry and intact.   Psychiatric: Normal mood and affect. Speech and behavior is normal.   Assessment & Plan:   Please refer to Problem List based Assessment and Plan

## 2014-11-18 NOTE — Patient Instructions (Addendum)
Try to stay hydrated at home with water.  You can take Tylenol for your fevers, and you will need to rest.   Please call the clinic if your fevers persist for the next few days, loose stools worsen, or if you are unable to keep fluids down.     Influenza Influenza ("the flu") is a viral infection of the respiratory tract. It occurs more often in winter months because people spend more time in close contact with one another. Influenza can make you feel very sick. Influenza easily spreads from person to person (contagious). CAUSES  Influenza is caused by a virus that infects the respiratory tract. You can catch the virus by breathing in droplets from an infected person's cough or sneeze. You can also catch the virus by touching something that was recently contaminated with the virus and then touching your mouth, nose, or eyes. RISKS AND COMPLICATIONS You may be at risk for a more severe case of influenza if you smoke cigarettes, have diabetes, have chronic heart disease (such as heart failure) or lung disease (such as asthma), or if you have a weakened immune system. Elderly people and pregnant women are also at risk for more serious infections. The most common problem of influenza is a lung infection (pneumonia). Sometimes, this problem can require emergency medical care and may be life threatening. SIGNS AND SYMPTOMS  Symptoms typically last 4 to 10 days and may include:  Fever.  Chills.  Headache, body aches, and muscle aches.  Sore throat.  Chest discomfort and cough.  Poor appetite.  Weakness or feeling tired.  Dizziness.  Nausea or vomiting. DIAGNOSIS  Diagnosis of influenza is often made based on your history and a physical exam. A nose or throat swab test can be done to confirm the diagnosis. TREATMENT  In mild cases, influenza goes away on its own. Treatment is directed at relieving symptoms. For more severe cases, your health care provider may prescribe antiviral  medicines to shorten the sickness. Antibiotic medicines are not effective because the infection is caused by a virus, not by bacteria. HOME CARE INSTRUCTIONS  Take medicines only as directed by your health care provider.  Use a cool mist humidifier to make breathing easier.  Get plenty of rest until your temperature returns to normal. This usually takes 3 to 4 days.  Drink enough fluid to keep your urine clear or pale yellow.  Cover yourmouth and nosewhen coughing or sneezing,and wash your handswellto prevent thevirusfrom spreading.  Stay homefromwork orschool untilthe fever is gonefor at least 221full day. PREVENTION  An annual influenza vaccination (flu shot) is the best way to avoid getting influenza. An annual flu shot is now routinely recommended for all adults in the [place] SEEK MEDICAL CARE IF:  You experiencechest pain, yourcough worsens,or you producemore mucus.  Youhave nausea,vomiting, ordiarrhea.  Your fever returns or gets worse. SEEK IMMEDIATE MEDICAL CARE IF:  You havetrouble breathing, you become short of breath,or your skin ornails becomebluish.  You have severe painor stiffnessin the neck.  You develop a sudden headache, or pain in the face or ear.  You have nausea or vomiting that you cannot control. MAKE SURE YOU:   Understand these instructions.  Will watch your condition.  Will get help right away if you are not doing well or get worse. Document Released: 08/27/2000 Document Revised: 01/14/2014 Document Reviewed: 11/29/2011 [facility] Patient Information 2015 [place], [place]. This information is not intended to replace advice given to you by your health care provider. Make  sure you discuss any questions you have with your health care provider. Acetaminophen tablets or caplets What is this medicine? ACETAMINOPHEN (a set a MEE noe fen) is a pain reliever. It is used to treat mild pain and fever. This medicine may be used for other  purposes; ask your health care provider or pharmacist if you have questions. COMMON BRAND NAME(S): Aceta, Actamin, Anacin Aspirin Free, Genapap, Genebs, Mapap, Pain & Fever, Pain and Fever, PAIN RELIEF, PAIN RELIEF Extra Strength, Pain Reliever, Panadol, PHARBETOL, Q-Pap, Q-Pap Extra Strength, Tylenol, Tylenol CrushableTablet, Tylenol Extra Strength, XS No Aspirin, XS Pain Reliever What should I tell my health care provider before I take this medicine? They need to know if you have any of these conditions: -if you often drink alcohol -liver disease -an unusual or allergic reaction to acetaminophen, other medicines, foods, dyes, or preservatives -pregnant or trying to get pregnant -breast-feeding How should I use this medicine? Take this medicine by mouth with a glass of water. Follow the directions on the package or prescription label. Take your medicine at regular intervals. Do not take your medicine more often than directed. Talk to your pediatrician regarding the use of this medicine in children. While this drug may be prescribed for children as young as 23 years of age for selected conditions, precautions do apply. Overdosage: If you think you have taken too much of this medicine contact a poison control center or emergency room at once. NOTE: This medicine is only for you. Do not share this medicine with others. What if I miss a dose? If you miss a dose, take it as soon as you can. If it is almost time for your next dose, take only that dose. Do not take double or extra doses. What may interact with this medicine? -alcohol -imatinib -isoniazid -other medicines with acetaminophen This list may not describe all possible interactions. Give your health care provider a list of all the medicines, herbs, non-prescription drugs, or dietary supplements you use. Also tell them if you smoke, drink alcohol, or use illegal drugs. Some items may interact with your medicine. What should I watch for while  using this medicine? Tell your doctor or health care professional if the pain lasts more than 10 days (5 days for children), if it gets worse, or if there is a new or different kind of pain. Also, check with your doctor if a fever lasts for more than 3 days. Do not take other medicines that contain acetaminophen with this medicine. Always read labels carefully. If you have questions, ask your doctor or pharmacist. If you take too much acetaminophen get medical help right away. Too much acetaminophen can be very dangerous and cause liver damage. Even if you do not have symptoms, it is important to get help right away. What side effects may I notice from receiving this medicine? Side effects that you should report to your doctor or health care professional as soon as possible: -allergic reactions like skin rash, itching or hives, swelling of the face, lips, or tongue -breathing problems -fever or sore throat -redness, blistering, peeling or loosening of the skin, including inside the mouth -trouble passing urine or change in the amount of urine -unusual bleeding or bruising -unusually weak or tired -yellowing of the eyes or skin Side effects that usually do not require medical attention (report to your doctor or health care professional if they continue or are bothersome): -headache -nausea, stomach upset This list may not describe all possible side effects. Call  your doctor for medical advice about side effects. You may report side effects to FDA at 1-800-FDA-1088. Where should I keep my medicine? Keep out of reach of children. Store at room temperature between 20 and 25 degrees C (68 and 77 degrees F). Protect from moisture and heat. Throw away any unused medicine after the expiration date. NOTE: This sheet is a summary. It may not cover all possible information. If   General Instructions:   Please bring your medicines with you each time you come to clinic.  Medicines may include  prescription medications, over-the-counter medications, herbal remedies, eye drops, vitamins, or other pills.   Progress Toward Treatment Goals:  Treatment Goal 10/17/2014  Hemoglobin A1C deteriorated  Blood pressure at goal  Prevent falls deteriorated    Self Care Goals & Plans:  Self Care Goal 11/18/2014  Manage my medications take my medicines as prescribed; bring my medications to every visit  Monitor my health keep track of my blood glucose; bring my glucose meter and log to each visit  Eat healthy foods drink diet soda or water instead of juice or soda; eat fruit for snacks and desserts  Be physically active find an activity I enjoy  Meeting treatment goals -    Home Blood Glucose Monitoring 10/17/2014  Check my blood sugar 2 times a day  When to check my blood sugar before meals     Care Management & Community Referrals:  Referral 10/17/2014  Referrals made for care management support none needed  Referrals made to community resources none       you have questions about this medicine, talk to your doctor, pharmacist, or health care provider.  2015, Elsevier/Gold Standard. (2013-04-23 12:54:16)

## 2014-11-19 NOTE — Progress Notes (Signed)
Internal Medicine Clinic Attending  Case discussed with Dr. Glenn at the time of the visit.  We reviewed the resident's history and exam and pertinent patient test results.  I agree with the assessment, diagnosis, and plan of care documented in the resident's note.  

## 2014-12-13 ENCOUNTER — Encounter (INDEPENDENT_AMBULATORY_CARE_PROVIDER_SITE_OTHER): Payer: Self-pay

## 2014-12-13 ENCOUNTER — Encounter: Payer: Self-pay | Admitting: Family Medicine

## 2014-12-13 ENCOUNTER — Ambulatory Visit (INDEPENDENT_AMBULATORY_CARE_PROVIDER_SITE_OTHER): Payer: Self-pay | Admitting: Family Medicine

## 2014-12-13 VITALS — BP 144/81 | Ht 64.0 in | Wt 313.0 lb

## 2014-12-13 DIAGNOSIS — M25561 Pain in right knee: Secondary | ICD-10-CM

## 2014-12-13 DIAGNOSIS — M25562 Pain in left knee: Secondary | ICD-10-CM

## 2014-12-13 MED ORDER — METHYLPREDNISOLONE ACETATE 40 MG/ML IJ SUSP
40.0000 mg | Freq: Once | INTRAMUSCULAR | Status: AC
  Administered 2014-12-13: 40 mg via INTRA_ARTICULAR

## 2014-12-13 NOTE — Progress Notes (Signed)
Patient ID: Sheila Ferguson, female   DOB: 1961/01/24, 54 y.o.   MRN: [id number]  Sheila Ferguson - 54 y.o. female MRN [id number]  Date of birth: 1961/01/24    SUBJECTIVE:     LEFT KNEE PAIN Aching pain in last few months. Similar to pain she has had in her right knee before. 6/10-8/10 at rest, 10/10 with walking, esp stair climbing. No giving way, no locking. Calf is not painful.  ROS:     Pertinent review of systems: negative for fever or unusual weight change. Has noted no swelling, no erythema or warmth of left kmnee. Has had no other unusual arthralgias.  PERTINENT  PMH / PSH FH / / SH:  Past Medical, Surgical, Social, and Family History Reviewed & Updated in the EMR.  Pertinent findings include:  Dm with retinopathy HTN CKD3 Obesity Trigger thumb  S/p previous injection therapy  Hx of previous CSI injection (several years ago at orthopedic office; said she had big pain flair lasting 2 days after that injection but overall had long term significant relief--she thinks that was the other knee--not sure)  OBJECTIVE: BP 144/81 mmHg  Ht 5\' 4"  (1.626 m)  Wt 313 lb (141.976 kg)  BMI 53.70 kg/m2  Physical Exam:  Vital signs are reviewed. WD WN Obese KNEES: No effusion, no unusual erythema or warmth. FROm in flexion and extension bilaterally. Left knee TTP medial joint line. Popliteal space is soft, calf is soft. Distally neurovascularly intact IMAGING: I see no previous knee films--she reports having some done at outside orthopedic office; these are not available to me today,   INJECTION: Patient was given informed consent, signed copy in the chart. Appropriate time out was taken. Area prepped and draped in usual sterile fashion. 1 cc of methylprednisolone 40 mg/ml plus  4 cc of 1% lidocaine without epinephrine was injected into the LEFT KNEE  using a(n) anterior medial approach. The patient tolerated the procedure well. There were no complications. Post procedure instructions  were given.  ASSESSMENT & PLAN:  See problem based charting & AVS for pt instructions.

## 2014-12-13 NOTE — Assessment & Plan Note (Signed)
LEFT knee injection today---she tolerated extremely well I would like her to call if she has any significant pain flair or other post procedure issues. We discussed red flags to watch for  (which would entaiol seeking emergent medical care). Sounds like previous knee injection was associated with some type post procedure complications--yet she has tolerated trigger thumb CSI previously under our care here with no issues.  I would also like to get some type of films and have ordered bilateral standing x ray and I will let her know those results by letter unless something concerning in which case I will call her. She can f/u prn.

## 2014-12-18 ENCOUNTER — Other Ambulatory Visit: Payer: Self-pay | Admitting: *Deleted

## 2014-12-18 DIAGNOSIS — R1013 Epigastric pain: Secondary | ICD-10-CM

## 2014-12-18 DIAGNOSIS — K219 Gastro-esophageal reflux disease without esophagitis: Secondary | ICD-10-CM

## 2014-12-18 MED ORDER — OMEPRAZOLE 40 MG PO CPDR: 40.0000 mg | DELAYED_RELEASE_CAPSULE | Freq: Every day | ORAL | Status: DC

## 2015-01-01 ENCOUNTER — Encounter: Payer: Self-pay | Admitting: Internal Medicine

## 2015-01-13 ENCOUNTER — Encounter: Payer: Self-pay | Admitting: *Deleted

## 2015-01-14 ENCOUNTER — Ambulatory Visit: Payer: Self-pay | Admitting: Nurse Practitioner

## 2015-01-15 ENCOUNTER — Encounter: Payer: Self-pay | Admitting: Pulmonary Disease

## 2015-01-15 ENCOUNTER — Ambulatory Visit (INDEPENDENT_AMBULATORY_CARE_PROVIDER_SITE_OTHER): Payer: No Typology Code available for payment source | Admitting: Pulmonary Disease

## 2015-01-15 ENCOUNTER — Encounter: Payer: Self-pay | Admitting: Gastroenterology

## 2015-01-15 VITALS — BP 129/49 | HR 78 | Temp 98.4°F | Ht 64.0 in | Wt 306.4 lb

## 2015-01-15 DIAGNOSIS — I10 Essential (primary) hypertension: Secondary | ICD-10-CM

## 2015-01-15 DIAGNOSIS — Z794 Long term (current) use of insulin: Secondary | ICD-10-CM

## 2015-01-15 DIAGNOSIS — E11319 Type 2 diabetes mellitus with unspecified diabetic retinopathy without macular edema: Secondary | ICD-10-CM

## 2015-01-15 DIAGNOSIS — K602 Anal fissure, unspecified: Secondary | ICD-10-CM

## 2015-01-15 DIAGNOSIS — E113399 Type 2 diabetes mellitus with moderate nonproliferative diabetic retinopathy without macular edema, unspecified eye: Secondary | ICD-10-CM

## 2015-01-15 DIAGNOSIS — E785 Hyperlipidemia, unspecified: Secondary | ICD-10-CM

## 2015-01-15 DIAGNOSIS — K219 Gastro-esophageal reflux disease without esophagitis: Secondary | ICD-10-CM

## 2015-01-15 LAB — POCT GLYCOSYLATED HEMOGLOBIN (HGB A1C): Hemoglobin A1C: 8.3

## 2015-01-15 LAB — LIPID PANEL
Cholesterol: 148 mg/dL (ref 0–200)
HDL: 49 mg/dL (ref >=46)
LDL Cholesterol: 72 mg/dL (ref 0–99)
Total CHOL/HDL Ratio: 3 Ratio
Triglycerides: 135 mg/dL (ref <150)
VLDL: 27 mg/dL (ref 0–40)

## 2015-01-15 LAB — GLUCOSE, CAPILLARY: Glucose-Capillary: 164 mg/dL — ABNORMAL HIGH (ref 70–99)

## 2015-01-15 MED ORDER — OMEPRAZOLE 40 MG PO CPDR: 40.0000 mg | DELAYED_RELEASE_CAPSULE | Freq: Every day | ORAL | Status: DC

## 2015-01-15 MED ORDER — POLYETHYLENE GLYCOL 3350 17 G PO PACK: 17.0000 g | PACK | Freq: Every day | ORAL | Status: DC

## 2015-01-15 NOTE — Progress Notes (Signed)
Subjective:   Patient ID: Sheila Ferguson, female    DOB: [birth year], 54 y.o.   MRN: [id number]  HPI Sheila Ferguson is a 54 year old woman with history of DM2, HTN, GERD presenting with blood per rectum.  Sheila Ferguson had a BM today and noted bright red blood on the toilet paper and in the toilet. Sheila Ferguson noticed a small spot of bright red blood on her toilet paper last week. Denies blood mixed in with the stools. Denies rectal pain, diarrhea, melena. Sheila Ferguson denies constipation but says that Sheila Ferguson had to push a little more than Sheila Ferguson usually does with the BM today. Denies LH with standing. Sheila Ferguson has chronic N/V and abdominal pain that has been better as of late.   Sheila Ferguson was last seen in clinic 11/18/2014. Sheila Ferguson was noted to have URI and treated symptomatically.  Review of Systems  Constitutional: no fevers/chills Eyes: no vision changes Ears, nose, mouth, throat, and face: no cough Respiratory: no shortness of breath Cardiovascular: no chest pain Gastrointestinal: no nausea/vomiting, no abdominal pain, no constipation, no diarrhea Genitourinary: no dysuria, no hematuria Integument: no rash Hematologic/lymphatic: no bleeding/bruising, no edema Musculoskeletal: no arthralgias, no myalgias Neurological: no paresthesias, no weakness  Past Medical History  Diagnosis Date  . Diabetes mellitus type II   . Hypertension   . Sleep apnea   . Congenital stricture of esophagus   . Dysmetabolic syndrome   . Headache(784.0)   . Sarcoid   . DIABETES MELLITUS, TYPE II, UNCONTROLLED 09/01/2007    04/10/13 eye exam neg proliferative DM retinopathy, +non proliferative DM retinopathy OS and OD +amblyopia OD   . Anal fissure   . GERD (gastroesophageal reflux disease)     Current Outpatient Prescriptions on File Prior to Visit  Medication Sig Dispense Refill  . citalopram (CELEXA) 40 MG tablet Take 1 tablet (40 mg total) by mouth daily. 90 tablet 1  . glipiZIDE (GLUCOTROL) 10 MG tablet TAKE ONE TABLET BY MOUTH  TWICE DAILY BEFORE MEAL(S) 180 tablet 2  . glucose blood test strip Use as instructed E11.65 100 each 12  . hydrochlorothiazide (HYDRODIURIL) 12.5 MG tablet Take 1 tablet (12.5 mg total) by mouth daily. 30 tablet 3  . insulin NPH Human (NOVOLIN N RELION) 100 UNIT/ML injection INJECT 33 UNITS SUBCUTANEOUSLY EACH MORNING AND 33 UNITS EACH EVENING 10 mL 5  . lisinopril (PRINIVIL,ZESTRIL) 40 MG tablet Take 1 tablet (40 mg total) by mouth daily. 90 tablet 2  . loratadine (CLARITIN) 10 MG tablet Take 1 tablet (10 mg total) by mouth daily as needed for allergies. 90 tablet 2  . lovastatin (MEVACOR) 20 MG tablet Take 1 tablet (20 mg total) by mouth at bedtime. 90 tablet 1  . metFORMIN (GLUCOPHAGE) 500 MG tablet Take 2 tablets (1,000 mg total) by mouth daily with breakfast. Take 2 tablets by mouth with breakfast and 1 tablet at dinner. 90 tablet 11  . metoprolol (LOPRESSOR) 100 MG tablet Take 1 tablet (100 mg total) by mouth 2 (two) times daily. 60 tablet 2  . omeprazole (PRILOSEC) 40 MG capsule Take 1 capsule (40 mg total) by mouth daily. 90 capsule 0  . traMADol (ULTRAM) 50 MG tablet Take 1 tablet (50 mg total) by mouth 2 (two) times daily as needed. 30 tablet 0   Current Facility-Administered Medications on File Prior to Visit  Medication Dose Route Frequency Provider Last Rate Last Dose  . methylPREDNISolone acetate (DEPO-MEDROL) 20 MG/ML injection 10 mg  10 mg Intra-articular Once Nestor RampSara L Neal,  MD        Today's Vitals   01/15/15 1324  BP: 129/49  Pulse: 78  Temp: 98.4 F (36.9 C)  TempSrc: Oral  Height: 5\' 4"  (1.626 m)  Weight: 306 lb 6.4 oz (138.982 kg)  SpO2: 98%  PainSc: 4   PainLoc: Abdomen   Objective:  Physical Exam  Constitutional: Sheila Ferguson is oriented to person, place, and time. Sheila Ferguson appears well-developed and well-nourished.  HENT:  Head: Normocephalic and atraumatic.  Mouth/Throat: Oropharynx is clear and moist.  Eyes: Conjunctivae are normal.  Neck: Neck supple.    Cardiovascular: Normal rate and regular rhythm.   Pulmonary/Chest: Effort normal. Sheila Ferguson has no wheezes.  Abdominal: Soft. Sheila Ferguson exhibits no distension. There is tenderness (mild in epigastric region).  Genitourinary:  No bleeding/thrombosed external hemorrhoids, no stool or blood in rectal vault. Exquisite pain upon digital rectal exam at external sphincter.  Musculoskeletal: Normal range of motion.  Neurological: Sheila Ferguson is alert and oriented to person, place, and time.  Skin: Skin is warm and dry.  Psychiatric: Sheila Ferguson has a normal mood and affect.    Assessment & Plan:  Please refer to problem based charting.

## 2015-01-15 NOTE — Patient Instructions (Signed)
You have anal fissures. Please take Miralax daily and perform sitz baths three times a day and after bowel movements.

## 2015-01-16 NOTE — Assessment & Plan Note (Signed)
Refilled omeprazole 40mg  daily. Pt to see GI.

## 2015-01-16 NOTE — Assessment & Plan Note (Signed)
Lab Results  Component Value Date   HGBA1C 8.3 01/15/2015   HGBA1C 9.6 09/04/2014   HGBA1C 8.8 06/11/2014     Assessment: Diabetes control: fair control Progress toward A1C goal:  improved Comments: Reports some episodes of hypoglycemia since her insulin was increased. Whenever she has those episodes, she does not take any of her medications.  Plan: Medications:  continue current medications metformin 1000mg  qAM and 500mg  qPM, glipizide 10mg  BID. Decrease Novolin from 33u to 30u BID.

## 2015-01-16 NOTE — Assessment & Plan Note (Signed)
Lipid Panel     Component Value Date/Time   CHOL 148 01/15/2015 1439   TRIG 135 01/15/2015 1439   HDL 49 01/15/2015 1439   CHOLHDL 3.0 01/15/2015 1439   VLDL 27 01/15/2015 1439   LDLCALC 72 01/15/2015 1439   Plan: Will continue lovastatin 20mg  daily.

## 2015-01-16 NOTE — Assessment & Plan Note (Signed)
Assessment: Likely source of bleeding is anal fissure. Has a history of this in the past. Noted that she has been eating more bananas recently and may be somewhat more constipated.  Plan:  -Sitz baths TID -Miralax daily -Pt was instructed to return to clinic if bleeding/pain worsen or do not improve.

## 2015-01-16 NOTE — Assessment & Plan Note (Addendum)
BP Readings from Last 3 Encounters:  01/15/15 129/49  12/13/14 144/81  11/18/14 143/82    Lab Results  Component Value Date   NA 139 11/18/2014   K 3.4* 11/18/2014   CREATININE 1.37* 11/18/2014    Assessment: Blood pressure control: controlled Progress toward BP goal:  at goal  Plan: Medications:  continue current medications including HCTZ 12.5mg  daily, lisinopril 40mg  daily, metoprolol 100mg  BID

## 2015-01-19 NOTE — Progress Notes (Signed)
Medicine attending: Medical history, presenting problems, physical findings, and medications, reviewed with Dr Jennifer Krall on the day of the patient visit and I concur with her evaluation and management plan. 

## 2015-01-23 ENCOUNTER — Other Ambulatory Visit: Payer: Self-pay | Admitting: Internal Medicine

## 2015-01-23 NOTE — Addendum Note (Signed)
Addended by: Charlsie MerlesGLENN, Toran Murch F on: 01/23/2015 03:04 PM   Modules accepted: Orders

## 2015-01-24 ENCOUNTER — Other Ambulatory Visit: Payer: Self-pay | Admitting: *Deleted

## 2015-01-27 MED ORDER — LISINOPRIL 40 MG PO TABS: 40.0000 mg | ORAL_TABLET | Freq: Every day | ORAL | Status: DC

## 2015-01-29 ENCOUNTER — Encounter: Payer: Self-pay | Admitting: *Deleted

## 2015-01-31 ENCOUNTER — Encounter: Payer: Self-pay | Admitting: Nurse Practitioner

## 2015-01-31 ENCOUNTER — Ambulatory Visit (INDEPENDENT_AMBULATORY_CARE_PROVIDER_SITE_OTHER): Payer: No Typology Code available for payment source | Admitting: Nurse Practitioner

## 2015-01-31 VITALS — BP 128/74 | HR 68 | Ht 64.0 in | Wt 308.2 lb

## 2015-01-31 DIAGNOSIS — R1013 Epigastric pain: Secondary | ICD-10-CM | POA: Diagnosis not present

## 2015-01-31 DIAGNOSIS — G8929 Other chronic pain: Secondary | ICD-10-CM | POA: Diagnosis not present

## 2015-01-31 DIAGNOSIS — K648 Other hemorrhoids: Secondary | ICD-10-CM | POA: Diagnosis not present

## 2015-01-31 DIAGNOSIS — R112 Nausea with vomiting, unspecified: Secondary | ICD-10-CM | POA: Diagnosis not present

## 2015-01-31 MED ORDER — HYDROCORTISONE ACETATE 25 MG RE SUPP: 25.0000 mg | Freq: Two times a day (BID) | RECTAL | Status: DC

## 2015-01-31 NOTE — Progress Notes (Addendum)
HPI :   Patient is a 54 year old female known remotely to Dr. Russella DarStark/ She had a colonoscopy for hematochezia in February 2008. Findings included an anal fissure. Patient referred by PCP for evaluation of epigastric pain. Patient states her upper abdominal pain is actually chronic, it has been present for years. The pain is intermittent, worse on empty stomach. Unclear if pain radiates to the back as patient has chronic back pain anyway. Patient also has a history of chronic GERD. She was started on a PPI several weeks ago and  GERD,as well as epigastric pain, nausea and vomiting have all improved. Apparently patient did not get a refill on the medication and symptoms recurred off therapy. She restarted PPI one week ago and symptoms improved. No unusual weight loss. Patient takes a full aspirin daily, no other NSAIDs.  Patient also describes recent painless rectal bleeding which lasted for about a week or so. PCP evaluated and felt patient had another anal fissure. Patient's bowel movements are pretty regular but she though she had been straining prior to the onset of bleeding.   Past Medical History  Diagnosis Date  . Diabetes mellitus type II   . Hypertension   . Sleep apnea   . Congenital stricture of esophagus   . Dysmetabolic syndrome   . Headache(784.0)   . Sarcoid   . DIABETES MELLITUS, TYPE II, UNCONTROLLED 09/01/2007    04/10/13 eye exam neg proliferative DM retinopathy, +non proliferative DM retinopathy OS and OD +amblyopia OD   . Anal fissure   . GERD (gastroesophageal reflux disease)   . Breast cancer   . Anxiety   . Depression   . Gall bladder disease     removed    Family History  Problem Relation Age of Onset  . Sarcoidosis Father   . Leukemia Father   . Hypertension Mother   . Diabetes Mother   . Heart attack Mother   . Aneurysm Mother     CNS  . Hypertension Sister   . Diabetes Sister   . Sleep apnea Sister    History  Substance Use Topics  . Smoking  status: Never Smoker   . Smokeless tobacco: Not on file  . Alcohol Use: No   Current Outpatient Prescriptions  Medication Sig Dispense Refill  . citalopram (CELEXA) 40 MG tablet TAKE ONE TABLET BY MOUTH DAILY 90 tablet 1  . glipiZIDE (GLUCOTROL) 10 MG tablet TAKE ONE TABLET BY MOUTH TWICE DAILY BEFORE MEAL(S) 180 tablet 2  . glucose blood test strip Use as instructed E11.65 100 each 12  . hydrochlorothiazide (HYDRODIURIL) 12.5 MG tablet Take 1 tablet (12.5 mg total) by mouth daily. 30 tablet 3  . insulin NPH Human (NOVOLIN N RELION) 100 UNIT/ML injection INJECT 33 UNITS SUBCUTANEOUSLY EACH MORNING AND 33 UNITS EACH EVENING 10 mL 5  . lisinopril (PRINIVIL,ZESTRIL) 40 MG tablet Take 1 tablet (40 mg total) by mouth daily. 90 tablet 1  . loratadine (CLARITIN) 10 MG tablet Take 1 tablet (10 mg total) by mouth daily as needed for allergies. 90 tablet 2  . lovastatin (MEVACOR) 20 MG tablet Take 1 tablet (20 mg total) by mouth at bedtime. 90 tablet 1  . metFORMIN (GLUCOPHAGE) 500 MG tablet Take 2 tablets (1,000 mg total) by mouth daily with breakfast. Take 2 tablets by mouth with breakfast and 1 tablet at dinner. 90 tablet 11  . metoprolol (LOPRESSOR) 100 MG tablet Take 1 tablet (100 mg total) by mouth 2 (two) times daily. 60  tablet 2  . omeprazole (PRILOSEC) 40 MG capsule Take 1 capsule (40 mg total) by mouth daily. 90 capsule 2  . traMADol (ULTRAM) 50 MG tablet Take 1 tablet (50 mg total) by mouth 2 (two) times daily as needed. 30 tablet 0   No current facility-administered medications for this visit.   Facility-Administered Medications Ordered in Other Visits  Medication Dose Route Frequency Provider Last Rate Last Dose  . methylPREDNISolone acetate (DEPO-MEDROL) 20 MG/ML injection 10 mg  10 mg Intra-articular Once Nestor RampSara L Neal, MD       Allergies  Allergen Reactions  . Azithromycin     REACTION: bump on face  . Metformin     REACTION: rectal tears as per patient     Review of  Systems: positive for allergies / sinus trouble, arthritis, back pain, depression, fatigue, hearing problems, heart murmur, day sweats, sore throat, swelling of feet and legs, excessive thirst, urine leakage. All other systems reviewed and negative except where noted in HPI.   Physical Exam: BP 128/74 mmHg  Pulse 68  Ht 5\' 4"  (1.626 m)  Wt 308 lb 4 oz (139.821 kg)  BMI 52.88 kg/m2 Constitutional: Pleasant morbidly obese black female in no acute distress. HEENT: Normocephalic and atraumatic. Conjunctivae are normal. No scleral icterus. Neck supple.  Cardiovascular: Normal rate, regular rhythm.  Pulmonary/chest: Effort normal and breath sounds normal. No wheezing, rales or rhonchi. Abdominal: Soft, nondistended, nontender. Bowel sounds active throughout. There are no masses palpable. No hepatomegaly. Rectal :no external lesions, on anoscopy there were inflamed internal hemorrhoids  Extremities: no edema Lymphadenopathy: No cervical adenopathy noted. Neurological: Alert and oriented to person place and time. Skin: Skin is warm and dry. No rashes noted. Psychiatric: Normal mood and affect. Behavior is normal.   ASSESSMENT AND PLAN: 321. 54 year old female with chronic intermittent epigastric pain, nausea and vomiting. Symptoms significantly improved on PPI.  At this point will hold off of any GI workup. Patient knows to call our office if she has recurrent symptoms. Did advise her to maintain blood sugars under 180 as hypoglycemia contributes to nausea and vomiting.   2. Rectal bleeding with bowel movements, resolved. Patient does have a history of an anal fissure several years back. This bleeding is painless I don't think she has a fissure, especially since I could not see one on exam. On anoscopy she does have inflamed internal hemorrhoids which I suspect are the source of bleeding. Will treat with steroid suppositories nightly for 1 week. Patient will call us back if she has recurrent  bleeding. She is up to date on colon cancer screening but loss threshold for repeating if bleeding recurs.    CC:  Griffin BasilJennifer Krall, MD

## 2015-01-31 NOTE — Patient Instructions (Addendum)
[facility] [address] [facility]. Anusol HC Suppositories If too expensive, you can get Preperation H 1 %  Suppositories.  You can also use Glycerin suppositories.   Use Stool softners which are over the counter as well.  Call us in a few weeks if you have recurrent bleeding.

## 2015-02-02 ENCOUNTER — Encounter: Payer: Self-pay | Admitting: Nurse Practitioner

## 2015-02-03 NOTE — Progress Notes (Signed)
Reviewed and agree with management plan.  Sagan Wurzel T. Axelle Szwed, MD FACG 

## 2015-02-19 ENCOUNTER — Other Ambulatory Visit: Payer: Self-pay | Admitting: *Deleted

## 2015-02-19 DIAGNOSIS — M25561 Pain in right knee: Secondary | ICD-10-CM

## 2015-02-19 DIAGNOSIS — M25562 Pain in left knee: Secondary | ICD-10-CM

## 2015-02-19 DIAGNOSIS — E785 Hyperlipidemia, unspecified: Secondary | ICD-10-CM

## 2015-02-19 MED ORDER — LOVASTATIN 20 MG PO TABS
20.0000 mg | ORAL_TABLET | Freq: Every day | ORAL | Status: DC
Start: 2015-02-19 — End: 2015-04-04

## 2015-02-19 NOTE — Telephone Encounter (Signed)
Tramadol was given by Dr Shirlee LatchMcLean in Feb for immediate use with no refills. I would not refill it without the patient being seen.

## 2015-03-27 ENCOUNTER — Other Ambulatory Visit: Payer: Self-pay | Admitting: *Deleted

## 2015-03-27 DIAGNOSIS — E113399 Type 2 diabetes mellitus with moderate nonproliferative diabetic retinopathy without macular edema, unspecified eye: Secondary | ICD-10-CM

## 2015-03-27 MED ORDER — INSULIN NPH (HUMAN) (ISOPHANE) 100 UNIT/ML ~~LOC~~ SUSP: SUBCUTANEOUS | Status: DC

## 2015-04-04 ENCOUNTER — Telehealth: Payer: Self-pay | Admitting: *Deleted

## 2015-04-04 ENCOUNTER — Other Ambulatory Visit: Payer: Self-pay | Admitting: *Deleted

## 2015-04-04 DIAGNOSIS — I10 Essential (primary) hypertension: Secondary | ICD-10-CM

## 2015-04-04 DIAGNOSIS — IMO0002 Reserved for concepts with insufficient information to code with codable children: Secondary | ICD-10-CM

## 2015-04-04 DIAGNOSIS — E785 Hyperlipidemia, unspecified: Secondary | ICD-10-CM

## 2015-04-04 DIAGNOSIS — E1165 Type 2 diabetes mellitus with hyperglycemia: Secondary | ICD-10-CM

## 2015-04-04 NOTE — Telephone Encounter (Signed)
Received fax from [facility] for refills; pharmacy not listed on her chart; wanted to know if she's changing pharmacies.

## 2015-04-04 NOTE — Telephone Encounter (Signed)
Fax from [facility] , [employer]; requesting 90 day supplies.

## 2015-04-07 MED ORDER — LOVASTATIN 20 MG PO TABS: 20.0000 mg | ORAL_TABLET | Freq: Every day | ORAL | Status: DC

## 2015-04-07 MED ORDER — LISINOPRIL 40 MG PO TABS: 40.0000 mg | ORAL_TABLET | Freq: Every day | ORAL | Status: DC

## 2015-04-07 MED ORDER — LORATADINE 10 MG PO TABS: 10.0000 mg | ORAL_TABLET | Freq: Every day | ORAL | Status: DC | PRN

## 2015-04-07 MED ORDER — GLIPIZIDE 10 MG PO TABS: ORAL_TABLET | ORAL | Status: DC

## 2015-04-07 MED ORDER — METFORMIN HCL 500 MG PO TABS: 1000.0000 mg | ORAL_TABLET | Freq: Two times a day (BID) | ORAL | Status: DC

## 2015-04-07 MED ORDER — METOPROLOL TARTRATE 100 MG PO TABS: 100.0000 mg | ORAL_TABLET | Freq: Two times a day (BID) | ORAL | Status: DC

## 2015-04-08 NOTE — Telephone Encounter (Signed)
Pt stated she will be using mail order pharmacy d/t her insurance; informed her refills have been sent to [facility] and filled.

## 2015-04-15 ENCOUNTER — Ambulatory Visit (INDEPENDENT_AMBULATORY_CARE_PROVIDER_SITE_OTHER): Payer: No Typology Code available for payment source | Admitting: Internal Medicine

## 2015-04-15 ENCOUNTER — Encounter: Payer: Self-pay | Admitting: Internal Medicine

## 2015-04-15 VITALS — BP 133/64 | HR 82 | Temp 98.5°F | Ht 64.0 in | Wt 307.8 lb

## 2015-04-15 DIAGNOSIS — F418 Other specified anxiety disorders: Secondary | ICD-10-CM | POA: Diagnosis not present

## 2015-04-15 DIAGNOSIS — E11319 Type 2 diabetes mellitus with unspecified diabetic retinopathy without macular edema: Secondary | ICD-10-CM

## 2015-04-15 DIAGNOSIS — I10 Essential (primary) hypertension: Secondary | ICD-10-CM

## 2015-04-15 DIAGNOSIS — Z794 Long term (current) use of insulin: Secondary | ICD-10-CM | POA: Diagnosis not present

## 2015-04-15 DIAGNOSIS — F419 Anxiety disorder, unspecified: Secondary | ICD-10-CM

## 2015-04-15 DIAGNOSIS — M25562 Pain in left knee: Secondary | ICD-10-CM

## 2015-04-15 DIAGNOSIS — Z0000 Encounter for general adult medical examination without abnormal findings: Secondary | ICD-10-CM

## 2015-04-15 DIAGNOSIS — F329 Major depressive disorder, single episode, unspecified: Secondary | ICD-10-CM

## 2015-04-15 DIAGNOSIS — E11359 Type 2 diabetes mellitus with proliferative diabetic retinopathy without macular edema: Secondary | ICD-10-CM

## 2015-04-15 DIAGNOSIS — M25561 Pain in right knee: Secondary | ICD-10-CM

## 2015-04-15 DIAGNOSIS — F32A Depression, unspecified: Secondary | ICD-10-CM

## 2015-04-15 LAB — GLUCOSE, CAPILLARY: Glucose-Capillary: 170 mg/dL — ABNORMAL HIGH (ref 65–99)

## 2015-04-15 LAB — POCT GLYCOSYLATED HEMOGLOBIN (HGB A1C): Hemoglobin A1C: 8.8

## 2015-04-15 NOTE — Patient Instructions (Signed)
-   Change Novolin to 30 units twice a day - Please try to eat at least 2 meals. This will help control your blood sugars.  - Take your insulin and diabetes medications as scheduled so we can see how your blood sugars are doing - Continue Tylenol 600 mg twice a day for your knee pain - Get knee x-rays - Referral to physical therapy made - Referral to gastroenterology for colonoscopy  General Instructions:   Thank you for bringing your medicines today. This helps [place] keep you safe from mistakes.   Progress Toward Treatment Goals:  Treatment Goal 01/15/2015  Hemoglobin A1C improved  Blood pressure at goal  Prevent falls -    Self Care Goals & Plans:  Self Care Goal 04/15/2015  Manage my medications bring my medications to every visit; refill my medications on time; take my medicines as prescribed; follow the sick day instructions if I am sick  Monitor my health keep track of my blood glucose; bring my glucose meter and log to each visit  Eat healthy foods drink diet soda or water instead of juice or soda; eat more vegetables; eat foods that are low in salt; eat baked foods instead of fried foods; eat fruit for snacks and desserts  Be physically active -  Meeting treatment goals -    Home Blood Glucose Monitoring 10/17/2014  Check my blood sugar 2 times a day  When to check my blood sugar before meals     Care Management & Community Referrals:  Referral 10/17/2014  Referrals made for care management support none needed  Referrals made to community resources none

## 2015-04-18 NOTE — Assessment & Plan Note (Signed)
BP Readings from Last 3 Encounters:  04/15/15 133/64  01/31/15 128/74  01/15/15 129/49    Lab Results  Component Value Date   NA 139 11/18/2014   K 3.4* 11/18/2014   CREATININE 1.37* 11/18/2014    Assessment: Blood pressure control: controlled Progress toward BP goal:  at goal Comments: BP well controlled.   Plan: Medications:  Continue HCTZ 12.5 mg daily, Lisinopril 40 mg daily, and Metoprolol 100 mg BID Educational resources provided: brochure (denies) Self management tools provided: home blood pressure logbook (denies)

## 2015-04-18 NOTE — Assessment & Plan Note (Signed)
Continue Celexa 40 mg daily. 

## 2015-04-18 NOTE — Progress Notes (Signed)
   Subjective:    Patient ID: Sheila Ferguson, female    DOB: October 28, 1960, 54 y.o.   MRN: [id number]  HPI Ms. Line is a 54yo woman with PMHx of HTN, Type 2 DM with retinopathy, CKD Stage 3, and depression who presents today for a routine visit.   Please refer to A&P documentation.    Review of Systems General: Denies fever, chills, night sweats, changes in weight, changes in appetite HEENT: Denies headaches, ear pain, changes in vision, rhinorrhea, sore throat CV: Denies CP, palpitations, SOB, orthopnea Pulm: Denies SOB, cough, wheezing GI: Denies abdominal pain, nausea, vomiting, diarrhea, constipation, melena, hematochezia GU: Denies dysuria, hematuria, frequency Msk: Reports bilateral knee pain. Denies muscle cramps Neuro: Reports numbness, tingling in hands and feet for last 2 months. Denies weakness Skin: Denies rashes, bruising    Objective:   Physical Exam General: sitting up in chair, NAD HEENT: [place]/AT, EOMI, sclera anicteric, mucus membranes moist CV: RRR, no m/g/r Pulm: CTA bilaterally, breaths non-labored Abd: BS+, soft, obese, non-tender Ext: warm, no edema, moves all Neuro: alert and oriented x 3, no focal deficits    Assessment & Plan:  Refer to A&P documentation.

## 2015-04-18 NOTE — Assessment & Plan Note (Signed)
Referral placed for colonoscopy. 

## 2015-04-18 NOTE — Assessment & Plan Note (Signed)
Lab Results  Component Value Date   HGBA1C 8.8 04/15/2015   HGBA1C 8.3 01/15/2015   HGBA1C 9.6 09/04/2014     Assessment: Diabetes control: fair control Progress toward A1C goal:  deteriorated Comments: HbA1c slightly higher this visit (8.8 today). Patient also reporting tingling/numbness in her hands and feet for the past 2 months. She reports she does not always take her insulin correctly (skips doses) because she forgets or does not want to get hypoglycemia. Her blood sugar log shows mostly CBGs in 200s-300s in AM and PM. One low of 67 noted in the morning. She states she has been taking NPH 34 units when she does take her insulin.   Plan: Medications:  Decrease NPH to 30 units BID. Continue Glipizide 10 mg daily and Metformin 500 mg BID.  Home glucose monitoring: Frequency: 2 times a day Timing: before meals Instruction/counseling given: reminded to bring blood glucose meter & log to each visit, reminded to bring medications to each visit and discussed the need for weight loss Educational resources provided: brochure (Denies) Self management tools provided: copy of home glucose meter download Other plans:  I think most of patient's hyperglycemia is a result of her not taking her insulin correctly due to fear of getting hypoglycemia. I have decreased her NPH in hopes to stop the lows from occurring. I explained that she needs to take the insulin twice daily so we can see how her blood sugars are doing on the new regimen.  - f/u in 1 month

## 2015-04-18 NOTE — Assessment & Plan Note (Signed)
Patient reports bilateral knee pain for the last 5-6 years. She reports a history of arthritis. She has been taking Tylenol 600 mg BID which alleviates her pain. She was supposed to get bilateral knee x-rays but has not done this yet. We discussed starting physical therapy and she is interested in pursuing this option. We also discussed how weight loss will likely help her pain as well. She states she would like to work with PT first and then work on weight loss once she is able to walk/move around more.  - Patient instructed to get bilateral knee x-rays - PT referral placed - Encouraged walking/light exercises, healthy diet

## 2015-04-23 NOTE — Progress Notes (Signed)
Internal Medicine Clinic Attending  Case discussed with Dr. Rivet soon after the resident saw the patient.  We reviewed the resident's history and exam and pertinent patient test results.  I agree with the assessment, diagnosis, and plan of care documented in the resident's note.  

## 2015-05-08 ENCOUNTER — Other Ambulatory Visit: Payer: Self-pay | Admitting: Internal Medicine

## 2015-05-12 ENCOUNTER — Ambulatory Visit: Payer: No Typology Code available for payment source | Attending: Internal Medicine | Admitting: Physical Therapy

## 2015-05-12 ENCOUNTER — Ambulatory Visit
Admission: RE | Admit: 2015-05-12 | Discharge: 2015-05-12 | Disposition: A | Discharge status: Home / Self Care | Payer: No Typology Code available for payment source | Source: Ambulatory Visit | Attending: Family Medicine | Admitting: Family Medicine

## 2015-05-12 DIAGNOSIS — M25562 Pain in left knee: Principal | ICD-10-CM

## 2015-05-12 DIAGNOSIS — G8929 Other chronic pain: Secondary | ICD-10-CM | POA: Diagnosis present

## 2015-05-12 DIAGNOSIS — M25551 Pain in right hip: Secondary | ICD-10-CM | POA: Diagnosis present

## 2015-05-12 DIAGNOSIS — M25561 Pain in right knee: Secondary | ICD-10-CM | POA: Diagnosis present

## 2015-05-12 DIAGNOSIS — M25552 Pain in left hip: Secondary | ICD-10-CM | POA: Insufficient documentation

## 2015-05-12 DIAGNOSIS — M549 Dorsalgia, unspecified: Secondary | ICD-10-CM | POA: Diagnosis present
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
## 2015-05-12 NOTE — Patient Instructions (Signed)
Quad Sets   Slowly tighten thigh muscles of straight, left leg while counting out loud to __5__. Relax. Repeat __10__ times. Do __2__ sessions per day.  PLACE TOWEL OR PILLOW UNDER KNEE   http://gt2.exer.us/293   Copyright  VHI. All rights reserved.

## 2015-05-13 ENCOUNTER — Ambulatory Visit: Payer: No Typology Code available for payment source | Admitting: Physical Therapy

## 2015-05-13 DIAGNOSIS — M25562 Pain in left knee: Secondary | ICD-10-CM

## 2015-05-13 DIAGNOSIS — G8929 Other chronic pain: Secondary | ICD-10-CM

## 2015-05-13 DIAGNOSIS — M25561 Pain in right knee: Secondary | ICD-10-CM | POA: Diagnosis not present

## 2015-05-13 DIAGNOSIS — M549 Dorsalgia, unspecified: Secondary | ICD-10-CM

## 2015-05-13 DIAGNOSIS — M25551 Pain in right hip: Secondary | ICD-10-CM

## 2015-05-13 DIAGNOSIS — M25552 Pain in left hip: Secondary | ICD-10-CM

## 2015-05-13 NOTE — Therapy (Addendum)
[place], [place], [address] Phone: [phone]   Fax:  [phone]  Physical Therapy Treatment  Patient Details  Name: KYNNADI DICENSO MRN: [id number] Date of Birth: [birth year] Referring Provider:  Juliet Rude, MD  Encounter Date: 05/13/2015      PT End of Session - 05/13/15 1503    Visit Number 2   Number of Visits 12   Date for PT Re-Evaluation 06/23/15   PT Start Time 0215   PT Stop Time 0300   PT Time Calculation (min) 45 min      Past Medical History  Diagnosis Date  . Diabetes mellitus type II   . Hypertension   . Sleep apnea   . Congenital stricture of esophagus   . Dysmetabolic syndrome   . Headache(784.0)   . Sarcoid   . DIABETES MELLITUS, TYPE II, UNCONTROLLED 09/01/2007    04/10/13 eye exam neg proliferative DM retinopathy, +non proliferative DM retinopathy OS and OD +amblyopia OD   . Anal fissure   . GERD (gastroesophageal reflux disease)   . Breast cancer   . Anxiety   . Depression   . Gall bladder disease     removed    Past Surgical History  Procedure Laterality Date  . Oophorectomy      right, due to endometriosis  . Cholecystectomy    . Esophageal dilation      x2  . Abdominal hysterectomy      There were no vitals filed for this visit.  Visit Diagnosis:  Knee pain, chronic, right  Knee pain, chronic, left  Hip pain, right  Hip pain, left  Back pain, chronic               [facility] Adult PT Treatment/Exercise - 05/13/15 1444    Lumbar Exercises: Stretches   Lower Trunk Rotation 10 seconds   Lumbar Exercises: Seated   Other Seated Lumbar Exercises Seated lumbar stab focus on neutral spine with alternating hip flexion x10 -patient fatigues   Knee/Hip Exercises: Stretches   Other Knee/Hip Stretches seated childs pose forward and lateral    Knee/Hip Exercises: Seated   Long Arc Quad Strengthening;Both;10 reps   Long Arc Quad Limitations red  band   Hamstring Curl Both;15 reps   Hamstring Limitations red band in door    Knee/Hip Exercises: Sidelying   Clams x 10  bilateral                PT Education - 05/13/15 1502    Education provided Yes   Education Details Clam in sidelying, seated knee theraband extension and flexion, lower trunk rotation   Person(s) Educated Patient   Methods Explanation;Handout   Comprehension Verbalized understanding          PT Short Term Goals - 05/12/15 1537    PT SHORT TERM GOAL #1   Title Pt. will be I with initial HEP   Time 2   Status New   PT SHORT TERM GOAL #2   Title Pt will demonstrate and verbilize understanding of pain relief strategies at home RICE and positioning    Time 2   Period Weeks   Status New           PT Long Term Goals - 05/12/15 1539    PT LONG TERM GOAL #1   Title Pt will be I with advanced HEP upon d/c   Time 6   Period Weeks   Status New  PT LONG TERM GOAL #2   Title Pt. will be able to go grocery shopping without increased pain in LE's completing more than >569f (walking distance)   Time 6   Period Weeks   Status New   PT LONG TERM GOAL #3   Title Pt. will demo improved standing time from 116m to 30 min in order to wash and put away dishes without increased pain   Time 6   Period Weeks   Status New   PT LONG TERM GOAL #4   Title Pt. 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 will report improvement in LE pain from 9/10 to 5/10 during caregiving services to patients which involves housekeeping duties, transfers, and bathing   Time 6   Period Weeks   Status New               Plan - 05/13/15 1559    Clinical Impression Statement Pt instructed in seated knee tstrengthening, seated core stabilization, seated and supine lumbar stretcing. Pt prefers seated to supine. Also sidelying clams to address abduction weakness.  All added to HEP   PT Next Visit Plan Review HEP, modalities- pt has family history of cancer- she has never been diagnosed        Problem List Patient Active Problem List   Diagnosis Date Noted  . Internal hemorrhoids 01/31/2015  . Abdominal pain, chronic, epigastric 01/31/2015  . Hypokalemia 11/18/2014  . Abdominal pain, epigastric 10/17/2014  . CKD (chronic kidney disease) stage 3, GFR 30-59 ml/min 09/04/2014  . Leg cramping 07/31/2014  . Plantar fasciitis, left 06/21/2014  . Heel pain, bilateral 06/11/2014  . Dysphagia, unspecified(787.20) 12/27/2013  . Trigger thumb of both thumbs 11/13/2013  . Health care maintenance 10/05/2013  . Knee pain, bilateral 10/05/2013  . Bilateral leg edema 10/05/2013  . Hyperlipidemia 12/25/2012  . Anxiety and depression 12/28/2011  . MIGRAINE HEADACHE 01/13/2008  . GERD 01/13/2008  . NEPHROLITHIASIS, HX OF 01/13/2008  . Anal fissure 01/13/2008  . Essential hypertension 02/09/2007  . TOTAL ABDOMINAL HYSTERECTOMY, HX OF 02/09/2007  . Diabetes mellitus type 2 with retinopathy 08/31/2004    DoDorene Ar PTA  05/13/2015, 4:26 PM  [facility]hone: [phone] Fax:  [phone]  PHYSICAL THERAPY DISCHARGE SUMMARY  Visits from Start of Care: 2  Current functional level related to goals / functional outcomes: See above for goals, unknown current   Remaining deficits: Unknown   Education / Equipment: HEP Plan: Patient agrees to discharge.  Patient goals were not met. Patient is being discharged due to not returning since the last visit.  ?????    JeRaeford RazorPT 08/01/2015 7:55 AM Phone: [phone]: [phone]

## 2015-05-13 NOTE — Patient Instructions (Signed)
Abduction: Clam (Eccentric) - Side-Lying   Lie on side with knees bent. Lift top knee, keeping feet together. Keep trunk steady. Slowly lower for 3-5 seconds. __10_ reps per set, __2_ sets per day, __7_ days per week. Add ___ lbs when you achieve ___ repetitions.  Copyright  VHI. All rights reserved.  .Lower Trunk Rotation Stretch   Keeping back flat and feet together, rotate knees to left side. Hold __15__ seconds. Repeat _3___ times per set. Do _2___ sets per session. Do __2__ sessions per day.  http://orth.exer.us/123   Copyright  VHI. All rights reserved.  BACK: Child's Pose (Sciatica)   Sit in knee-chest position and reach arms forward. Separate knees for comfort. Hold position for _5__ breaths. Repeat _2__ times. Do _2__ times per day. ROLL CHAIR TO RIGHT AND LEFT TO GET STRETCH ON THE SIDE Copyright  VHI. All rights reserved.   Knee Extension: Resisted (Sitting)   With band looped around right ankle and under other foot, straighten leg with ankle loop. Keep other leg bent to increase resistance. Repeat __10__ times per set. Do 2____ sets per session. Do __2__ sessions per day.  http://orth.exer.us/690   CKnee Flexion: Resisted (Sitting)   Sit with band under left foot and looped around ankle of supported leg. Pull unsupported leg back. Repeat _10___ times per set. Do __2__ sets per session. Do _2___ sessions per day.  http://orth.exer.us/695   Copyright  VHI. All rights reserved.

## 2015-05-20 ENCOUNTER — Encounter: Payer: Self-pay | Admitting: Gastroenterology

## 2015-05-24 ENCOUNTER — Encounter: Payer: Self-pay | Admitting: Family Medicine

## 2015-05-26 ENCOUNTER — Encounter: Payer: No Typology Code available for payment source | Admitting: Physical Therapy

## 2015-05-28 ENCOUNTER — Encounter: Payer: No Typology Code available for payment source | Admitting: Physical Therapy

## 2015-06-02 ENCOUNTER — Encounter: Payer: No Typology Code available for payment source | Admitting: Physical Therapy

## 2015-06-04 ENCOUNTER — Encounter: Payer: No Typology Code available for payment source | Admitting: Physical Therapy

## 2015-06-09 ENCOUNTER — Encounter: Payer: No Typology Code available for payment source | Admitting: Physical Therapy

## 2015-06-11 ENCOUNTER — Encounter: Payer: No Typology Code available for payment source | Admitting: Physical Therapy

## 2015-07-07 ENCOUNTER — Other Ambulatory Visit: Payer: Self-pay | Admitting: Internal Medicine

## 2015-08-05 ENCOUNTER — Encounter: Payer: No Typology Code available for payment source | Admitting: Internal Medicine

## 2015-09-09 ENCOUNTER — Other Ambulatory Visit: Payer: Self-pay | Admitting: *Deleted

## 2015-09-09 MED ORDER — INSULIN NPH (HUMAN) (ISOPHANE) 100 UNIT/ML ~~LOC~~ SUSP: SUBCUTANEOUS | Status: DC

## 2015-09-09 MED ORDER — METFORMIN HCL 500 MG PO TABS: ORAL_TABLET | ORAL | Status: DC

## 2015-09-23 ENCOUNTER — Encounter: Payer: Self-pay | Admitting: Internal Medicine

## 2015-10-01 ENCOUNTER — Other Ambulatory Visit: Payer: Self-pay | Admitting: Internal Medicine

## 2015-10-01 DIAGNOSIS — E1121 Type 2 diabetes mellitus with diabetic nephropathy: Secondary | ICD-10-CM

## 2015-10-01 DIAGNOSIS — E1165 Type 2 diabetes mellitus with hyperglycemia: Secondary | ICD-10-CM

## 2015-10-01 DIAGNOSIS — IMO0002 Reserved for concepts with insufficient information to code with codable children: Secondary | ICD-10-CM

## 2015-10-01 DIAGNOSIS — E1100 Type 2 diabetes mellitus with hyperosmolarity without nonketotic hyperglycemic-hyperosmolar coma (NKHHC): Secondary | ICD-10-CM

## 2015-10-01 DIAGNOSIS — I10 Essential (primary) hypertension: Secondary | ICD-10-CM

## 2015-10-01 DIAGNOSIS — E785 Hyperlipidemia, unspecified: Secondary | ICD-10-CM

## 2015-10-01 DIAGNOSIS — K219 Gastro-esophageal reflux disease without esophagitis: Secondary | ICD-10-CM

## 2015-10-01 MED ORDER — HYDROCHLOROTHIAZIDE 12.5 MG PO TABS: 12.5000 mg | ORAL_TABLET | Freq: Every day | ORAL | Status: DC

## 2015-10-01 MED ORDER — INSULIN NPH (HUMAN) (ISOPHANE) 100 UNIT/ML ~~LOC~~ SUSP: SUBCUTANEOUS | Status: DC

## 2015-10-01 MED ORDER — METFORMIN HCL 500 MG PO TABS: ORAL_TABLET | ORAL | Status: DC

## 2015-10-01 MED ORDER — HYDROCORTISONE ACETATE 25 MG RE SUPP: 25.0000 mg | Freq: Two times a day (BID) | RECTAL | Status: DC

## 2015-10-01 MED ORDER — LORATADINE 10 MG PO TABS: 10.0000 mg | ORAL_TABLET | Freq: Every day | ORAL | Status: DC | PRN

## 2015-10-01 MED ORDER — OMEPRAZOLE 40 MG PO CPDR: 40.0000 mg | DELAYED_RELEASE_CAPSULE | Freq: Every day | ORAL | Status: DC

## 2015-10-01 MED ORDER — METOPROLOL TARTRATE 100 MG PO TABS: 100.0000 mg | ORAL_TABLET | Freq: Two times a day (BID) | ORAL | Status: DC

## 2015-10-01 MED ORDER — LOVASTATIN 20 MG PO TABS: 20.0000 mg | ORAL_TABLET | Freq: Every day | ORAL | Status: DC

## 2015-10-01 MED ORDER — GLUCOSE BLOOD VI STRP: ORAL_STRIP | Status: DC

## 2015-10-01 MED ORDER — GLIPIZIDE 10 MG PO TABS: ORAL_TABLET | ORAL | Status: DC

## 2015-10-01 MED ORDER — LISINOPRIL 40 MG PO TABS: 40.0000 mg | ORAL_TABLET | Freq: Every day | ORAL | Status: DC

## 2015-10-01 MED ORDER — CITALOPRAM HYDROBROMIDE 40 MG PO TABS: 40.0000 mg | ORAL_TABLET | Freq: Every day | ORAL | Status: DC

## 2015-10-01 NOTE — Telephone Encounter (Signed)
Pt requesting all meds to be filled. °

## 2015-10-01 NOTE — Telephone Encounter (Signed)
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

## 2015-10-06 ENCOUNTER — Encounter: Payer: Self-pay | Admitting: Pulmonary Disease

## 2015-10-06 ENCOUNTER — Ambulatory Visit (INDEPENDENT_AMBULATORY_CARE_PROVIDER_SITE_OTHER): Payer: BLUE CROSS/BLUE SHIELD | Admitting: Pulmonary Disease

## 2015-10-06 VITALS — BP 143/71 | HR 79 | Temp 98.0°F | Resp 18 | Ht 65.0 in | Wt 308.3 lb

## 2015-10-06 DIAGNOSIS — M170 Bilateral primary osteoarthritis of knee: Secondary | ICD-10-CM

## 2015-10-06 DIAGNOSIS — M79651 Pain in right thigh: Secondary | ICD-10-CM

## 2015-10-06 DIAGNOSIS — E1165 Type 2 diabetes mellitus with hyperglycemia: Secondary | ICD-10-CM | POA: Diagnosis not present

## 2015-10-06 DIAGNOSIS — Z7984 Long term (current) use of oral hypoglycemic drugs: Secondary | ICD-10-CM

## 2015-10-06 DIAGNOSIS — M79652 Pain in left thigh: Secondary | ICD-10-CM

## 2015-10-06 DIAGNOSIS — Z794 Long term (current) use of insulin: Secondary | ICD-10-CM

## 2015-10-06 DIAGNOSIS — E11319 Type 2 diabetes mellitus with unspecified diabetic retinopathy without macular edema: Secondary | ICD-10-CM | POA: Diagnosis not present

## 2015-10-06 DIAGNOSIS — E113599 Type 2 diabetes mellitus with proliferative diabetic retinopathy without macular edema, unspecified eye: Secondary | ICD-10-CM

## 2015-10-06 DIAGNOSIS — I10 Essential (primary) hypertension: Secondary | ICD-10-CM | POA: Diagnosis not present

## 2015-10-06 LAB — POCT GLYCOSYLATED HEMOGLOBIN (HGB A1C): Hemoglobin A1C: 9.9

## 2015-10-06 LAB — GLUCOSE, CAPILLARY: GLUCOSE-CAPILLARY: 180 mg/dL — AB (ref 65–99)

## 2015-10-06 MED ORDER — GABAPENTIN 100 MG PO CAPS: 100.0000 mg | ORAL_CAPSULE | Freq: Three times a day (TID) | ORAL | Status: DC

## 2015-10-06 NOTE — Progress Notes (Signed)
Subjective:    Patient ID: Sheila Ferguson, female    DOB: [birth year], 55 y.o.   MRN: [id number]  HPI [name] is a 55 year old woman with history of DM2, HTn, OSA, sarcoid, GERD, depression/anxiety presenting for follow up of her diabetes.  She has not been checking her blood sugars regularly. She reports compliance of her diabetes medications including glipizide  daily, metformin  in AM and  in PM, and Novolin 33 units BID. She had one episode of hypoglycemia last week to 70. Denies polydipsia or polyuria.  Review of Systems Constitutional: no fevers/chills Respiratory: no shortness of breath Cardiovascular: no chest pain Gastrointestinal: no nausea/vomiting, no abdominal pain, no constipation, no diarrhea Genitourinary: no dysuria Hematologic/lymphatic: no edema Neurological: no weakness  Past Medical History  Diagnosis Date  . Diabetes mellitus type II   . Hypertension   . Sleep apnea   . Congenital stricture of esophagus   . Dysmetabolic syndrome   . Headache(784.0)   . Sarcoid (HCC)   . DIABETES MELLITUS, TYPE II, UNCONTROLLED 09/01/2007    04/10/13 eye exam neg proliferative DM retinopathy, +non proliferative DM retinopathy OS and OD +amblyopia OD   . Anal fissure   . GERD (gastroesophageal reflux disease)   . Breast cancer (HCC)   . Anxiety   . Depression   . Gall bladder disease     removed    Current Outpatient Prescriptions on File Prior to Visit  Medication Sig Dispense Refill  . citalopram (CELEXA) 40 MG tablet Take 1 tablet (40 mg total) by mouth daily. 90 tablet 0  . glipiZIDE (GLUCOTROL) 10 MG tablet TAKE ONE TABLET BY MOUTH TWICE DAILY BEFORE MEAL(S) 180 tablet 0  . glucose blood test strip Use to check blood sugar 3 to 4 times daily. diag code E11.65. Insulin dependent 100 each 2  . hydrochlorothiazide (HYDRODIURIL) 12.5 MG tablet Take 1 tablet (12.5 mg total) by mouth daily. 90 tablet 0  . hydrocortisone (ANUSOL-HC) 25  MG suppository Place 1 suppository (25 mg total) rectally 2 (two) times daily. 7 suppository 1  . insulin NPH Human (NOVOLIN N RELION) 100 UNIT/ML injection INJECT 30 UNITS SUBCUTANEOUSLY EACH MORNING AND 30 UNITS EACH EVENING 10 mL 2  . lisinopril (PRINIVIL,ZESTRIL) 40 MG tablet Take 1 tablet (40 mg total) by mouth daily. 90 tablet 0  . loratadine (CLARITIN) 10 MG tablet Take 1 tablet (10 mg total) by mouth daily as needed for allergies. 90 tablet 0  . lovastatin (MEVACOR) 20 MG tablet Take 1 tablet (20 mg total) by mouth at bedtime. 90 tablet 0  . metFORMIN (GLUCOPHAGE) 500 MG tablet TAKE 2 TABLETS WITH BREAKFAST AND 1 TABLET WITH DINNER DAILY 270 tablet 0  . metoprolol (LOPRESSOR) 100 MG tablet Take 1 tablet (100 mg total) by mouth 2 (two) times daily. 180 tablet 0  . omeprazole (PRILOSEC) 40 MG capsule Take 1 capsule (40 mg total) by mouth daily. 90 capsule 0   Current Facility-Administered Medications on File Prior to Visit  Medication Dose Route Frequency Provider Last Rate Last Dose  . methylPREDNISolone acetate (DEPO-MEDROL) 20 MG/ML injection 10 mg  10 mg Intra-articular Once Nestor Ramp, MD        Today's Vitals   10/06/15 1600 10/06/15 1644  BP: 178/95 143/71  Pulse: 79   Temp: 98 F (36.7 C)   TempSrc: Oral   Resp: 18   Height:  (1.651 m)   Weight: 308 lb 4.8 oz (139.844 kg)  SpO2: 96%     Objective:   Physical Exam  Constitutional: She is oriented to person, place, and time. She appears well-developed and well-nourished. No distress.  Cardiovascular: Normal rate, regular rhythm and normal heart sounds.   Pulmonary/Chest: Effort normal and breath sounds normal. She has no wheezes. She has no rales.  Musculoskeletal: Normal range of motion. She exhibits no tenderness.  Neurological: She is alert and oriented to person, place, and time. No cranial nerve deficit. Coordination normal.   Assessment & Plan:  Please refer to problem based charting.

## 2015-10-06 NOTE — Patient Instructions (Signed)
1. Please check your blood sugars 4 times a day: before breakfast, before lunch, before dinner, and at bedtime. 2. Take your diabetes medications as instructed. 3. Follow up in 2 weeks with your meter.

## 2015-10-07 DIAGNOSIS — M79652 Pain in left thigh: Secondary | ICD-10-CM

## 2015-10-07 DIAGNOSIS — M79651 Pain in right thigh: Secondary | ICD-10-CM | POA: Insufficient documentation

## 2015-10-07 NOTE — Assessment & Plan Note (Signed)
Assessment: Bilateral knee osteoarthritis previously followed by sports medicine. Chronic pain from this. Lost to follow up and did not complete PT due to insurance/financial issues  Plan: -Referral placed to follow up with sports medicine.

## 2015-10-07 NOTE — Assessment & Plan Note (Addendum)
Assessment: Bilateral lateral thigh pain and numbness R>L for the past 3-4 months. She also reports an associated leaking sensation subcutaneously. Some associated lower back, hip, and upper buttock pain. Pain worse at night. She does not feel that her pants are too tight. Denies leg weakness or stool incontinence. No recent trauma. Differential includes referred pain from lumbosacral spine, meralgia paresthetica.  Plan: -Will work on weight loss, referral placed for medical nutrition therapy -Gabapentin  TID

## 2015-10-07 NOTE — Assessment & Plan Note (Signed)
Lab Results  Component Value Date   HGBA1C 9.9 10/06/2015   HGBA1C 8.8 04/15/2015   HGBA1C 8.3 01/15/2015     Assessment: Diabetes control: poor control (HgbA1C >9%) Progress toward A1C goal:  deteriorated Comments: Only two measurements on meter -- 1/21 10AM 206 and 1/16 8PM 70.  Plan: Medications:  Continue glipizide  daily, metformin  qAM and  qPM, NPH 33 units BID Home glucose monitoring: Frequency: 4 times daily Timing: before meals and at bedtime Other plans:  -I asked her to bring her meter and check her CBG more frequently to follow up in 1-2 weeks. -Referral to [facility] for assistance in nutrition for Diabetes control and weight loss

## 2015-10-07 NOTE — Progress Notes (Signed)
Case discussed with Dr. Krall at the time of the visit. We reviewed the resident's history and exam and pertinent patient test results. I agree with the assessment, diagnosis, and plan of care documented in the resident's note. 

## 2015-10-07 NOTE — Assessment & Plan Note (Signed)
BP Readings from Last 3 Encounters:  10/06/15 143/71  04/15/15 133/64  01/31/15 128/74    Lab Results  Component Value Date   NA 139 11/18/2014   K 3.4* 11/18/2014   CREATININE 1.37* 11/18/2014    Assessment: Blood pressure control: mildly elevated Progress toward BP goal:  deteriorated   Recently restarted her medications last week  Plan: Medications:  Continue HCTZ 12.5mg  daily, lisinopril  daily, metoprolol  BID Other plans:  -Follow up in 1-2 weeks

## 2015-10-10 ENCOUNTER — Ambulatory Visit: Payer: BLUE CROSS/BLUE SHIELD | Admitting: Family Medicine

## 2015-10-17 ENCOUNTER — Encounter: Payer: Self-pay | Admitting: Family Medicine

## 2015-10-17 ENCOUNTER — Ambulatory Visit (INDEPENDENT_AMBULATORY_CARE_PROVIDER_SITE_OTHER): Payer: BLUE CROSS/BLUE SHIELD | Admitting: Family Medicine

## 2015-10-17 VITALS — BP 160/87 | Ht 64.0 in | Wt 308.0 lb

## 2015-10-17 DIAGNOSIS — M170 Bilateral primary osteoarthritis of knee: Secondary | ICD-10-CM

## 2015-10-17 MED ORDER — METHYLPREDNISOLONE ACETATE 40 MG/ML IJ SUSP
40.0000 mg | Freq: Once | INTRAMUSCULAR | Status: AC
  Administered 2015-10-17: 40 mg via INTRA_ARTICULAR

## 2015-10-17 NOTE — Addendum Note (Signed)
Addended by: Annita Brod on: 10/17/2015 09:24 AM   Modules accepted: Orders

## 2015-10-17 NOTE — Progress Notes (Signed)
Patient ID: Sheila Ferguson, female   DOB: Dec 28, 1960, 55 y.o.   MRN: [id number]  Sheila Ferguson - 55 y.o. female MRN [id number]  Date of birth: 1961-08-04    SUBJECTIVE:     B/L KNEE PAIN Aching pain in last few months. Pain w/ walking, esp stair climbing. No giving way, no locking. Calf is not painful.  Previous steroid injections performed about one year ago which she states worked great up until about 1-2 months ago now.  Denies effusions at this time or any injuries.  Is taking medications OTC for this.   ROS:     Pertinent review of systems: negative for fever or unusual weight change.  PERTINENT  PMH / PSH FH / / SH:  Past Medical, Surgical, Social, and Family History Reviewed & Updated in the EMR.    OBJECTIVE: BP 160/87 mmHg  Ht  (1.626 m)  Wt 308 lb (139.708 kg)  BMI 52.84 kg/m2  Physical Exam:  Vital signs are reviewed. WD WN Obese KNEES: No effusion, no unusual erythema or warmth. FROM in flexion and extension bilaterally.B/L knee TTP medial joint line. Popliteal space is soft, calf is soft. Distally neurovascularly intact  Imaging: 05/12/15 - Bicompartmental OA B/L knees.

## 2015-10-17 NOTE — Assessment & Plan Note (Signed)
Repeat injections today. - Options include knee sleeve, OTC Tylenol/NSAIDS, PT, synvisc, tramadol - F/U PRN for repeat injections.   Aspiration/Injection Procedure Note Sheila Ferguson 1961-05-04  Procedure: Injection Indications: B/L Knee OA  Procedure Details Consent: Risks of procedure as well as the alternatives and risks of each were explained to the (patient/caregiver).  Consent for procedure obtained. Time Out: Verified patient identification, verified procedure, site/side was marked, verified correct patient position, special equipment/implants available, medications/allergies/relevent history reviewed, required imaging and test results available.  Performed.  The area was cleaned with iodine and alcohol swabs.    The B/L medial knee  was injected using 2 cc's of  Depomedrol and 4 cc's of 1% lidocaine with a 21 1 1/2" needle.    A sterile dressing was applied.  Patient did tolerate procedure well. Estimated blood loss: N/A

## 2015-10-23 ENCOUNTER — Encounter (HOSPITAL_COMMUNITY): Payer: Self-pay

## 2015-10-23 ENCOUNTER — Emergency Department (HOSPITAL_COMMUNITY)
Admission: EM | Admit: 2015-10-23 | Discharge: 2015-10-23 | Disposition: A | Discharge status: Home / Self Care | Payer: BLUE CROSS/BLUE SHIELD | Source: Home / Self Care

## 2015-10-23 DIAGNOSIS — K047 Periapical abscess without sinus: Secondary | ICD-10-CM

## 2015-10-23 MED ORDER — AMOXICILLIN-POT CLAVULANATE 875-125 MG PO TABS: 1.0000 | ORAL_TABLET | Freq: Two times a day (BID) | ORAL | Status: DC

## 2015-10-23 MED ORDER — ACETAMINOPHEN-CODEINE #3 300-30 MG PO TABS: 1.0000 | ORAL_TABLET | Freq: Four times a day (QID) | ORAL | Status: DC | PRN

## 2015-10-23 NOTE — ED Notes (Signed)
Pt stated that she had a filling fall out and that she has a hole in her tooth. She said she took half of a percocet earlier and know she is nauseous  Pt alert and oriented.

## 2015-10-23 NOTE — ED Provider Notes (Signed)
CSN: [id number]     Arrival date & time 10/23/15  1820 History   None    Chief Complaint  Patient presents with  . Dental Pain   (Consider location/radiation/quality/duration/timing/severity/associated sxs/prior Treatment) Patient is a 55 y.o. female presenting with tooth pain. The history is provided by the patient. No language interpreter was used.  Dental Pain Associated symptoms: no congestion and no fever    Patient presents to [facility] with complaint of LEFT UPPER MOLAR PAIN which began this morning when she woke up.  Had filling in the tooth many years ago, fell out 3 months ago but was painless. This morning awoke with pain.  Denies fever or chills; has had difficulty eating today due to pain.   Patient does not have an established dentist but has Secretary/administrator, is searching for a dentist who takes her insurance.  Past Medical History  Diagnosis Date  . Diabetes mellitus type II   . Hypertension   . Sleep apnea   . Congenital stricture of esophagus   . Dysmetabolic syndrome   . Headache(784.0)   . Sarcoid (HCC)   . DIABETES MELLITUS, TYPE II, UNCONTROLLED 09/01/2007    04/10/13 eye exam neg proliferative DM retinopathy, +non proliferative DM retinopathy OS and OD +amblyopia OD   . Anal fissure   . GERD (gastroesophageal reflux disease)   . Breast cancer (HCC)   . Anxiety   . Depression   . Gall bladder disease     removed   Past Surgical History  Procedure Laterality Date  . Oophorectomy      right, due to endometriosis  . Cholecystectomy    . Esophageal dilation      x2  . Abdominal hysterectomy     Family History  Problem Relation Age of Onset  . Sarcoidosis Father   . Leukemia Father   . Hypertension Mother   . Diabetes Mother   . Heart attack Mother   . Aneurysm Mother     CNS  . Hypertension Sister   . Diabetes Sister   . Sleep apnea Sister    Social History  Substance Use Topics  . Smoking status: Never Smoker   . Smokeless tobacco: None  .  Alcohol Use: No   OB History    No data available     Review of Systems  Constitutional: Negative for fever, chills and fatigue.  HENT: Positive for dental problem. Negative for congestion.   Respiratory: Negative for cough.   All other systems reviewed and are negative.   Allergies  Azithromycin  Home Medications   Prior to Admission medications   Medication Sig Start Date End Date Taking? Authorizing Provider  acetaminophen-codeine (TYLENOL #3) 300-30 MG tablet Take 1-2 tablets by mouth every 6 (six) hours as needed for moderate pain. 10/23/15   Barbaraann Barthel, MD  amoxicillin-clavulanate (AUGMENTIN) 875-125 MG tablet Take 1 tablet by mouth 2 (two) times daily. 10/23/15   Barbaraann Barthel, MD  citalopram (CELEXA) 40 MG tablet Take 1 tablet (40 mg total) by mouth daily. 10/01/15   Carly Arlyce Harman, MD  gabapentin (NEURONTIN) 100 MG capsule Take 1 capsule (100 mg total) by mouth 3 (three) times daily. 10/06/15   Lora Paula, MD  glipiZIDE (GLUCOTROL) 10 MG tablet TAKE ONE TABLET BY MOUTH TWICE DAILY BEFORE MEAL(S) 10/01/15   Carly J Rivet, MD  glucose blood test strip Use to check blood sugar 3 to 4 times daily. diag code E11.65. Insulin dependent 10/01/15  Su Hoff, MD  hydrochlorothiazide (HYDRODIURIL) 12.5 MG tablet Take 1 tablet (12.5 mg total) by mouth daily. 10/01/15   Carly Arlyce Harman, MD  hydrocortisone (ANUSOL-HC) 25 MG suppository Place 1 suppository (25 mg total) rectally 2 (two) times daily. 10/01/15   Carly J Rivet, MD  insulin NPH Human (NOVOLIN N RELION) 100 UNIT/ML injection INJECT 30 UNITS SUBCUTANEOUSLY EACH MORNING AND 30 UNITS EACH EVENING 10/01/15   Su Hoff, MD  lisinopril (PRINIVIL,ZESTRIL) 40 MG tablet Take 1 tablet (40 mg total) by mouth daily. 10/01/15   Carly Arlyce Harman, MD  loratadine (CLARITIN) 10 MG tablet Take 1 tablet (10 mg total) by mouth daily as needed for allergies. 10/01/15 09/27/16  Su Hoff, MD  lovastatin (MEVACOR) 20 MG tablet Take 1 tablet (20 mg  total) by mouth at bedtime. 10/01/15   Carly Arlyce Harman, MD  metFORMIN (GLUCOPHAGE) 500 MG tablet TAKE 2 TABLETS WITH BREAKFAST AND 1 TABLET WITH DINNER DAILY 10/01/15   Su Hoff, MD  metoprolol (LOPRESSOR) 100 MG tablet Take 1 tablet (100 mg total) by mouth 2 (two) times daily. 10/01/15 09/27/16  Su Hoff, MD  omeprazole (PRILOSEC) 40 MG capsule Take 1 capsule (40 mg total) by mouth daily. 10/01/15   Su Hoff, MD   Meds Ordered and Administered this Visit  Medications - No data to display  BP 171/90 mmHg  Pulse 79  Temp(Src) 98.4 F (36.9 C) (Oral)  Resp 16  SpO2 100% No data found.   Physical Exam  Constitutional: She appears well-developed and well-nourished. No distress.  HENT:  Head: Normocephalic and atraumatic.  Right Ear: External ear normal.  Left Ear: External ear normal.  Mouth/Throat: Oropharynx is clear and moist. No oropharyngeal exudate.  LEFT UPPER MOLAR WITH VISIBLE CARIES; mild erythema around gingiva surrounding molar.  Parotid duct without erythema. Moist mucus membranes.   Eyes: Conjunctivae and EOM are normal. Pupils are equal, round, and reactive to light.  Neck: Normal range of motion. Neck supple. No thyromegaly present.  Lymphadenopathy:    She has no cervical adenopathy.  Skin: She is not diaphoretic.    ED Course  Procedures (including critical care time)  Labs Review Labs Reviewed - No data to display  Imaging Review No results found.   Visual Acuity Review  Right Eye Distance:   Left Eye Distance:   Bilateral Distance:    Right Eye Near:   Left Eye Near:    Bilateral Near:         MDM   1. Dental infection    Mild odontogenic infection. Patient is tolerating orals; no fevers or chills. Temporizing until she is able to establish with a dentist.  Augmentin and T#3 for pain.  Discussed indications for return to ED or UCC.   Paula Compton, MD    Barbaraann Barthel, MD 10/23/15 [phone]

## 2015-10-23 NOTE — Discharge Instructions (Signed)
It is a pleasure to see you today for the complaint of dental pain.   As we discussed, it is imperative that you see a dentist to treat the underlying cause of your pain.   I am prescribing you the following two medications:   1> Augmentin (antibiotic), take 1 tablet by mouth twice daily for 14 days; 2.>Tylenol #3, 1-2 tablets by mouth every six hours as needed for pain.  You may also take ibuprofen  tablets, take 2 to 4 tablets by mouth every 6 hours as needed.

## 2015-10-27 ENCOUNTER — Encounter: Payer: BLUE CROSS/BLUE SHIELD | Admitting: Dietician

## 2015-10-29 ENCOUNTER — Telehealth: Payer: Self-pay | Admitting: Licensed Clinical Social Worker

## 2015-10-29 NOTE — Telephone Encounter (Signed)
Sheila Ferguson was referred to CSW as pt canceled recent appointment due to lack of transportation.  If pt lives on bus route and familiar with fixed route bus, CSW will offer bus pass.  CSW placed call to Sheila Ferguson. Pt states she has a car and canceled due to being unable to get the time off of work for appointment.  Sheila Ferguson voiced that she is not feeling well at this time, CSW offered to notify triage and if pt needed an appointment.  Pt declined stating "it's viral, I just have to wait it out."  Pt denied add'l social work needs at this time.

## 2015-11-25 NOTE — Addendum Note (Signed)
Addended by: Neomia DearPOWERS, Kenedi Cilia E on: 11/25/2015 07:14 PM   Modules accepted: Orders

## 2016-01-05 ENCOUNTER — Other Ambulatory Visit: Payer: Self-pay

## 2016-01-05 MED ORDER — INSULIN NPH (HUMAN) (ISOPHANE) 100 UNIT/ML ~~LOC~~ SUSP: SUBCUTANEOUS | Status: DC

## 2016-01-12 ENCOUNTER — Telehealth: Payer: Self-pay | Admitting: Internal Medicine

## 2016-01-12 NOTE — Telephone Encounter (Signed)
APPT. REMINDER CALL, LMTCB °

## 2016-01-13 ENCOUNTER — Ambulatory Visit (INDEPENDENT_AMBULATORY_CARE_PROVIDER_SITE_OTHER): Payer: BLUE CROSS/BLUE SHIELD | Admitting: Internal Medicine

## 2016-01-13 ENCOUNTER — Other Ambulatory Visit: Payer: Self-pay | Admitting: Internal Medicine

## 2016-01-13 ENCOUNTER — Encounter: Payer: Self-pay | Admitting: Internal Medicine

## 2016-01-13 VITALS — BP 153/64 | HR 78 | Temp 99.2°F | Ht 64.0 in | Wt 312.1 lb

## 2016-01-13 DIAGNOSIS — E11649 Type 2 diabetes mellitus with hypoglycemia without coma: Secondary | ICD-10-CM | POA: Diagnosis not present

## 2016-01-13 DIAGNOSIS — N183 Chronic kidney disease, stage 3 unspecified: Secondary | ICD-10-CM

## 2016-01-13 DIAGNOSIS — M170 Bilateral primary osteoarthritis of knee: Secondary | ICD-10-CM

## 2016-01-13 DIAGNOSIS — E1122 Type 2 diabetes mellitus with diabetic chronic kidney disease: Secondary | ICD-10-CM

## 2016-01-13 DIAGNOSIS — M25562 Pain in left knee: Secondary | ICD-10-CM

## 2016-01-13 DIAGNOSIS — Z794 Long term (current) use of insulin: Secondary | ICD-10-CM | POA: Diagnosis not present

## 2016-01-13 DIAGNOSIS — I10 Essential (primary) hypertension: Secondary | ICD-10-CM

## 2016-01-13 DIAGNOSIS — M25561 Pain in right knee: Secondary | ICD-10-CM

## 2016-01-13 DIAGNOSIS — I129 Hypertensive chronic kidney disease with stage 1 through stage 4 chronic kidney disease, or unspecified chronic kidney disease: Secondary | ICD-10-CM

## 2016-01-13 DIAGNOSIS — Z1231 Encounter for screening mammogram for malignant neoplasm of breast: Secondary | ICD-10-CM

## 2016-01-13 DIAGNOSIS — G8929 Other chronic pain: Secondary | ICD-10-CM | POA: Insufficient documentation

## 2016-01-13 DIAGNOSIS — Z79899 Other long term (current) drug therapy: Secondary | ICD-10-CM

## 2016-01-13 DIAGNOSIS — E113599 Type 2 diabetes mellitus with proliferative diabetic retinopathy without macular edema, unspecified eye: Secondary | ICD-10-CM

## 2016-01-13 LAB — GLUCOSE, CAPILLARY: GLUCOSE-CAPILLARY: 64 mg/dL — AB (ref 65–99)

## 2016-01-13 LAB — POCT GLYCOSYLATED HEMOGLOBIN (HGB A1C): HEMOGLOBIN A1C: 8.8

## 2016-01-13 MED ORDER — AMLODIPINE BESYLATE 5 MG PO TABS: 5.0000 mg | ORAL_TABLET | Freq: Every day | ORAL | Status: DC

## 2016-01-13 MED ORDER — ACETAMINOPHEN 500 MG PO TABS: 1000.0000 mg | ORAL_TABLET | Freq: Three times a day (TID) | ORAL | Status: AC

## 2016-01-13 NOTE — Assessment & Plan Note (Signed)
She has chronic pain in her knees and back. I advised her to stop all NSAIDs given her CKD stage 3. Advised to start Tylenol 1000 mg TID and to restart gabapentin 100 mg TID. Will likely have to titrate up Gabapentin but will continue to monitor pain control.

## 2016-01-13 NOTE — Patient Instructions (Signed)
General Instructions: - Start taking Amlodipine 5 mg daily for your blood pressure - STOP hydrochlorothiazide - Continue Novolin 33 units in the morning and 33 units in the evening - Please check blood sugars twice daily with meals - Stop taking ibuprofen, aleve, etc. These medicines are bad for your kidneys. - Can start taking Tylenol 1000 mg three times daily - Restart Gabapentin 100 mg three times daily - Mammogram will be scheduled - Please call Dr. Nile RiggsShapiro for an eye exam - [facility] will call you to set up nutrition counseling - Follow up in 2 weeks to reassess blood sugars and blood pressure  Please bring your medicines with you each time you come to clinic.  Medicines may include prescription medications, over-the-counter medications, herbal remedies, eye drops, vitamins, or other pills.   Progress Toward Treatment Goals:  Treatment Goal 10/06/2015  Hemoglobin A1C deteriorated  Blood pressure deteriorated    Self Care Goals & Plans:  Self Care Goal 01/13/2016  Manage my medications take my medicines as prescribed; bring my medications to every visit; refill my medications on time  Monitor my health keep track of my blood pressure; bring my glucose meter and log to each visit; keep track of my blood glucose; bring my blood pressure log to each visit  Eat healthy foods eat more vegetables; eat foods that are low in salt; eat baked foods instead of fried foods  Be physically active find an activity I enjoy    Home Blood Glucose Monitoring 04/15/2015  Check my blood sugar 2 times a day  When to check my blood sugar before meals     Care Management & Community Referrals:  Referral 10/17/2014  Referrals made for care management support none needed  Referrals made to community resources none

## 2016-01-13 NOTE — Assessment & Plan Note (Signed)
Had a long discussion with the patient today about her CKD as she was not aware of this diagnosis. I explained that uncontrolled HTN and DM are the likely culprit for her kidney disease and that it is important for us to get these under control. She is very motivated by this news. I also advised her to stop taking any NSAIDs. She understands the plan. - Will check bmet today

## 2016-01-13 NOTE — Progress Notes (Signed)
Hypoglycemic Event  CBG:64  Treatment: 4 oz OJ Symptoms: States she feels tired.  Follow-up CBG: Time BMET ordered per MD CBG Result: see result  Possible Reasons for Event: Works 3rd shift; ate breakfast,  took her meds then came to her appt. Comments/MD notified:Yes.    Sheila Ferguson, Sheila Ferguson

## 2016-01-13 NOTE — Assessment & Plan Note (Signed)
Will stop HCTZ. Start Amlodipine 5 mg daily. Continue Lisinopril 40 mg daily and Lopressor 100 mg BID. BP recheck in 2 weeks.

## 2016-01-13 NOTE — Assessment & Plan Note (Signed)
Mammogram ordered

## 2016-01-13 NOTE — Progress Notes (Signed)
   Subjective:    Patient ID: Sheila Ferguson, female    DOB: 10-Nov-1960, 55 y.o.   MRN: [id number]  HPI [name] is a 55yo woman with PMHx of HTN, type 2 DM, and CKD stage 3 who presents today for follow up of her hypertension.  HTN: BP mildly elevated today at 153/64. She admits she is not taking her HCTZ consistently because it gives her leg muscle cramps. She is taking Lopressor 100 mg BID and Lisinopril 40 mg daily.   Type 2 DM: Last A1c 9.9, repeat today is 8.8. She takes Metformin 1000 mg in AM and 500 mg in PM, Glipizide 10 mg BID, and Novolin 33 units in AM and 33 units in PM. She reports experiencing low blood sugars over the last few months because she switched her morning/evening diabetes regimen around to see if this would help with her work schedule (works 3rd shift). She reports she switched back to her prescribed routine in the last 2-3 weeks and no longer has any low blood sugars.   CKD Stage 3: Patient was unaware of her CKD diagnosis so we discussed this today. Her Cr was 1.37 with GFR 50 in March 2016. She denies any difficulties urinating or hematuria.   Chronic Knee/Back Pain: Reports her pain is not well controlled. She is frustrated because she wants to lose weight but is limited with exercise due to the pain. She stopped taking her Gabapentin. She is currently taking Tylenol 500 mg daily and ibuprofen up to 1000 mg daily.    Review of Systems General: Denies fever, chills, night sweats, changes in weight, changes in appetite HEENT: Denies headaches, ear pain, changes in vision, rhinorrhea, sore throat CV: Denies CP, palpitations, SOB, orthopnea Pulm: Denies SOB, cough, wheezing GI: Denies abdominal pain, nausea, vomiting, diarrhea, constipation, melena, hematochezia GU: Denies dysuria, frequency Msk: See HPI Neuro: Denies weakness, numbness, tingling Skin: Denies rashes, bruising Psych: Denies depression, anxiety, hallucinations    Objective:   Physical  Exam General: alert, sitting up, NAD HEENT: /AT, EOMI, sclera anicteric, mucus membranes moist CV: RRR, no m/g/r Pulm: CTA bilaterally, breaths non-labored Abd: BS+, soft, obese, non-tender Ext: warm, no peripheral edema  Neuro: alert and oriented x 3, no focal deficits    Assessment & Plan:  Please refer to A&P documentation.

## 2016-01-13 NOTE — Assessment & Plan Note (Signed)
A1c has improved from 9.9 to 8.8. This may have been related to her frequent hypoglycemia over the past few months though. She is no longer having hypoglycemia since switching back to her prescribed regimen. I will not make any changes at this time since she had a low blood sugar in the office today of 64. I advised her continue Novolin 33 units BID with meals (she only eats 2 meals) and to check her blood sugar twice daily before meals. Will have her return in 2 weeks to reassess blood sugars and adjust insulin as necessary. - Continue Novolin 33 units BID with meals - Continue Metformin 1000 mg in AM and 500 mg in PM - Continue Glipizide 10 mg BID - Advised to call Dr. Nile RiggsShapiro for eye exam - [facility]

## 2016-01-14 ENCOUNTER — Telehealth: Payer: Self-pay | Admitting: Internal Medicine

## 2016-01-14 LAB — BMP8+ANION GAP
Anion Gap: 16 mmol/L (ref 10.0–18.0)
BUN/Creatinine Ratio: 13 (ref 9–23)
BUN: 13 mg/dL (ref 6–24)
CO2: 24 mmol/L (ref 18–29)
CREATININE: 0.99 mg/dL (ref 0.57–1.00)
Calcium: 9.5 mg/dL (ref 8.7–10.2)
Chloride: 104 mmol/L (ref 96–106)
GFR, EST AFRICAN AMERICAN: 75 mL/min/{1.73_m2} (ref >59)
GFR, EST NON AFRICAN AMERICAN: 65 mL/min/{1.73_m2} (ref >59)
Glucose: 80 mg/dL (ref 65–99)
POTASSIUM: 4.3 mmol/L (ref 3.5–5.2)
SODIUM: 144 mmol/L (ref 134–144)

## 2016-01-14 LAB — CBC
Hematocrit: 34.2 % (ref 34.0–46.6)
Hemoglobin: 11.7 g/dL (ref 11.1–15.9)
MCH: 28.5 pg (ref 26.6–33.0)
MCHC: 34.2 g/dL (ref 31.5–35.7)
MCV: 83 fL (ref 79–97)
PLATELETS: 374 10*3/uL (ref 150–379)
RBC: 4.1 x10E6/uL (ref 3.77–5.28)
RDW: 14.9 % (ref 12.3–15.4)
WBC: 10.6 10*3/uL (ref 3.4–10.8)

## 2016-01-14 NOTE — Telephone Encounter (Signed)
Gave patient lab results

## 2016-01-14 NOTE — Progress Notes (Signed)
Internal Medicine Clinic Attending  Case discussed with Dr. Rivet at the time of the visit.  We reviewed the resident's history and exam and pertinent patient test results.  I agree with the assessment, diagnosis, and plan of care documented in the resident's note.  

## 2016-01-23 ENCOUNTER — Ambulatory Visit (INDEPENDENT_AMBULATORY_CARE_PROVIDER_SITE_OTHER): Payer: BLUE CROSS/BLUE SHIELD | Admitting: Dietician

## 2016-01-23 ENCOUNTER — Ambulatory Visit: Payer: BLUE CROSS/BLUE SHIELD | Admitting: Internal Medicine

## 2016-01-23 ENCOUNTER — Other Ambulatory Visit: Payer: Self-pay | Admitting: Dietician

## 2016-01-23 ENCOUNTER — Ambulatory Visit (INDEPENDENT_AMBULATORY_CARE_PROVIDER_SITE_OTHER): Payer: BLUE CROSS/BLUE SHIELD | Admitting: Internal Medicine

## 2016-01-23 ENCOUNTER — Encounter: Payer: Self-pay | Admitting: Internal Medicine

## 2016-01-23 ENCOUNTER — Encounter: Payer: BLUE CROSS/BLUE SHIELD | Admitting: Dietician

## 2016-01-23 VITALS — BP 130/72 | HR 82 | Temp 98.4°F | Ht 64.0 in | Wt 310.7 lb

## 2016-01-23 DIAGNOSIS — Z794 Long term (current) use of insulin: Principal | ICD-10-CM

## 2016-01-23 DIAGNOSIS — E11319 Type 2 diabetes mellitus with unspecified diabetic retinopathy without macular edema: Secondary | ICD-10-CM | POA: Diagnosis not present

## 2016-01-23 DIAGNOSIS — M170 Bilateral primary osteoarthritis of knee: Secondary | ICD-10-CM

## 2016-01-23 DIAGNOSIS — E1122 Type 2 diabetes mellitus with diabetic chronic kidney disease: Secondary | ICD-10-CM

## 2016-01-23 DIAGNOSIS — I129 Hypertensive chronic kidney disease with stage 1 through stage 4 chronic kidney disease, or unspecified chronic kidney disease: Secondary | ICD-10-CM

## 2016-01-23 DIAGNOSIS — E113599 Type 2 diabetes mellitus with proliferative diabetic retinopathy without macular edema, unspecified eye: Secondary | ICD-10-CM

## 2016-01-23 DIAGNOSIS — E1142 Type 2 diabetes mellitus with diabetic polyneuropathy: Secondary | ICD-10-CM

## 2016-01-23 DIAGNOSIS — I10 Essential (primary) hypertension: Secondary | ICD-10-CM

## 2016-01-23 DIAGNOSIS — Z6841 Body Mass Index (BMI) 40.0 and over, adult: Secondary | ICD-10-CM

## 2016-01-23 DIAGNOSIS — E119 Type 2 diabetes mellitus without complications: Secondary | ICD-10-CM | POA: Diagnosis not present

## 2016-01-23 DIAGNOSIS — N182 Chronic kidney disease, stage 2 (mild): Secondary | ICD-10-CM

## 2016-01-23 DIAGNOSIS — E11649 Type 2 diabetes mellitus with hypoglycemia without coma: Secondary | ICD-10-CM

## 2016-01-23 DIAGNOSIS — Z713 Dietary counseling and surveillance: Secondary | ICD-10-CM

## 2016-01-23 LAB — GLUCOSE, CAPILLARY: Glucose-Capillary: 167 mg/dL — ABNORMAL HIGH (ref 65–99)

## 2016-01-23 MED ORDER — BAYER MICROLET LANCETS MISC: Status: DC

## 2016-01-23 MED ORDER — CITALOPRAM HYDROBROMIDE 40 MG PO TABS: 40.0000 mg | ORAL_TABLET | Freq: Every day | ORAL | Status: DC

## 2016-01-23 MED ORDER — AMLODIPINE BESYLATE 10 MG PO TABS: 10.0000 mg | ORAL_TABLET | Freq: Every day | ORAL | Status: DC

## 2016-01-23 MED ORDER — GABAPENTIN 300 MG PO CAPS: 300.0000 mg | ORAL_CAPSULE | Freq: Three times a day (TID) | ORAL | Status: DC

## 2016-01-23 MED ORDER — AMLODIPINE BESYLATE 5 MG PO TABS: 5.0000 mg | ORAL_TABLET | Freq: Every day | ORAL | Status: DC

## 2016-01-23 MED ORDER — GLUCOSE 40 % PO GEL: 1.0000 | Freq: Once | ORAL | Status: DC | PRN

## 2016-01-23 MED ORDER — DICLOFENAC SODIUM 1 % TD GEL
4.0000 g | Freq: Four times a day (QID) | TRANSDERMAL | Status: DC
Start: 2016-01-23 — End: 2016-10-22

## 2016-01-23 MED ORDER — INSULIN SYRINGE-NEEDLE U-100 31G X 15/64" 0.5 ML MISC
Status: DC
Start: 2016-01-23 — End: 2018-08-15

## 2016-01-23 MED ORDER — METOPROLOL TARTRATE 100 MG PO TABS: 100.0000 mg | ORAL_TABLET | Freq: Two times a day (BID) | ORAL | Status: DC

## 2016-01-23 MED ORDER — OMEPRAZOLE 40 MG PO CPDR
40.0000 mg | DELAYED_RELEASE_CAPSULE | Freq: Every day | ORAL | Status: DC
Start: 2016-01-23 — End: 2016-10-22

## 2016-01-23 MED ORDER — INSULIN NPH (HUMAN) (ISOPHANE) 100 UNIT/ML ~~LOC~~ SUSP: SUBCUTANEOUS | Status: DC

## 2016-01-23 MED ORDER — LISINOPRIL 40 MG PO TABS: 40.0000 mg | ORAL_TABLET | Freq: Every day | ORAL | Status: DC

## 2016-01-23 MED ORDER — GLIPIZIDE 10 MG PO TABS: ORAL_TABLET | ORAL | Status: DC

## 2016-01-23 MED ORDER — GLUCOSE BLOOD VI STRP: ORAL_STRIP | Status: DC

## 2016-01-23 NOTE — Assessment & Plan Note (Signed)
Cr and GFR improved based on last week's labs. Her kidney function has fluctuated between Stage 2/3. If she has worsening of her kidney function again would obtain renal US to see if consistent with medical renal disease and get UA to check for protein/blood.

## 2016-01-23 NOTE — Assessment & Plan Note (Signed)
She is still having some hypoglycemia, particularly around dinner time. Will decrease her morning dose of NPH to 30 units and continue NPH 33 units in the evening since her morning blood sugars are still in the 200 range. Advised her to check more frequently so we can see how her blood sugars are doing throughout the day. She works the 3rd shift for her job so makes controlling her sugars a little more difficult. Will continue Metformin 1000 mg in AM and 500 mg in PM. Reminded her to call eye doctor for appointment. Follow up in 1 month to reassess blood sugars.

## 2016-01-23 NOTE — Assessment & Plan Note (Signed)
She continues to have pain in her bilateral knees. She also has diabetic peripheral neuropathy which adds to her pain level. I advised her to increase Tylenol to 1000 mg TID and to start voltaren gel 4 times daily. Will increase her Gabapentin to 300 mg TID to see if this helps her pain level.

## 2016-01-23 NOTE — Patient Instructions (Addendum)
General Instructions: - Increase Tylenol to 1000 mg three times daily - We will increase Gabapentin to 300 mg three times daily - Start using voltaren gel up to 4 times daily for your knees - Change Novolin NPH to 30 units in the morning and 33 units in the evening - Please try to test twice daily before meals - Follow up in 3-4 weeks to reassess blood pressure and blood sugars  Thank you for bringing your medicines today. This helps us keep you safe from mistakes.   Progress Toward Treatment Goals:  Treatment Goal 10/06/2015  Hemoglobin A1C deteriorated  Blood pressure deteriorated    Self Care Goals & Plans:  Self Care Goal 01/13/2016  Manage my medications take my medicines as prescribed; bring my medications to every visit; refill my medications on time  Monitor my health keep track of my blood pressure; bring my glucose meter and log to each visit; keep track of my blood glucose; bring my blood pressure log to each visit  Eat healthy foods eat more vegetables; eat foods that are low in salt; eat baked foods instead of fried foods  Be physically active find an activity I enjoy    Home Blood Glucose Monitoring 04/15/2015  Check my blood sugar 2 times a day  When to check my blood sugar before meals     Care Management & Community Referrals:  Referral 10/17/2014  Referrals made for care management support none needed  Referrals made to community resources none

## 2016-01-23 NOTE — Progress Notes (Signed)
Medical nutrition therapy coverage verified today by caklling [employer] of [place]:the folliowing codes are billable, no pa required, coverage All codes are covered at 100% with no limits on the number of visit: G0108, [id number], [id number]   Medical Nutrition Therapy:  Appt start time: 1130 end time:  1215 Visit # 1  Assessment:  Primary concerns today:  Patient wants help with controlling her blood sugars and meal planning for weight loss. She is planning to begin exercising soon as well. She asks about places and cost.  She was provided with a sample meter today that is preferred on her insurance. She is concerned about the cost of foods that she thinks she needs for weight loss. This was clarified with education about what foods are needed on a daily basis to meet her nutrition needs. She also requested help with planning her meal and grocery list to help her eat the foods she needs Preferred Learning Style: No preference indicated  Learning Readiness: Ready  ANTHROPOMETRICS: weight-310#  BMI-53 WEIGHT HISTORY:260# in 2008, increased to ~300# by 2012, then increased to 310# over the past 5 years SLEEP:not discussed MEDICATIONS: 33 units in pm 30 units in am of novolin N. glipizide 10 mg twice a day, metformin  BLOOD SUGAR: 170-119-105 DIETARY INTAKE: Usual eating pattern includes 2 meals and 3 snacks per day. Everyday foods include pretzels vegetables, almond milk.  Avoided foods include cow's milk.   24-hr recall:  B ( AM): usually skips, sometimes Snk ( AM): pretzels, juice, Snacks- stops to get  regular soda and chips to treat low blood sugars Dinner: fish, brussel sprouts, greens, broccoli Beverages: juice, water, diet soda  Usual physical activity: limited due to weight and knees  Estimated energy needs:not needed for today's visit  Progress Towards Goal(s):  In progress.   Nutritional Diagnosis:  NB-1.1 Food and nutrition-related knowledge deficit As related to lack of sufficient diabetes  training.  As evidenced by her questions and concerns.    Intervention:  Nutrition education about insurance coverage, self monitoring, affordable and local options for exercise Coordination of care: request strips for sample meter provided for pt today, glucose gel, consider minimizing dicine that cause low blood sugar and weight gain. Patients formulary given to her to review with doctor at next visit  Teaching Method Utilized: Visual, Auditory, Hands on Handouts given during visit include:plate method handout with food lists of portions sizes and meal planning book that gives more detail Barriers to learning/adherence to lifestyle change: limited material resources Demonstrated degree of understanding via:  Teach Back   Monitoring/Evaluation:  Dietary intake, exercise, meter, and body weight in 4 week(s).

## 2016-01-23 NOTE — Telephone Encounter (Signed)
Needs rx for syringes

## 2016-01-23 NOTE — Telephone Encounter (Signed)
Patient received new meter today and requests prescription for supplies and glucose tabs. She had low blood sugar symptoms at today's visit ~ 3  Hours after small breakfast and reports her blood sugar fasting today was in 200s, here 170 prior to meeting with CDE, and it was 199 then 105 in CDE's office. She is afraid she may needs the glucose tabs while driving or if she cuts back her food intake and starts to exercise (which is her plan) that she will continue to have problems with low blood sugars.

## 2016-01-23 NOTE — Assessment & Plan Note (Signed)
BP improved to 130/82 on recheck. Will hold off on increasing Amlodipine but would have low threshold. Will continue Amlodipine 5 mg daily, Lisinopril 40 mg daily, and Lopressor 100 mg BID. Follow up in 1 month.

## 2016-01-23 NOTE — Progress Notes (Signed)
   Subjective:    Patient ID: Sheila Ferguson, female    DOB: Mar 24, 1961, 55 y.o.   MRN: [id number]  HPI [name] is a 55yo woman with PMHx of HTN, type 2 DM, and hyperlipidemia who presents today for follow up of her diabetes.  Type 2 DM: Last A1c 8.8. She it taking Metformin 1000 mg in AM and 500 mg in PM, Glipizide 10 mg BID, and NPH 33 units BID with meals. She reports she did check her blood sugars twice daily as instructed. Per review of her glucometer, most AM blood sugars are in the 180-220 range. Early afternoon blood sugars range from the 110s-300s. Evening blood sugars run in the 60s-160 range. She reports testing every time she has hypoglycemia. Lowest blood sugar recorded is 60. She states she self-adjusted her morning insulin dose a few days from 33 units to 30 units and noticed she was not getting lows with that change.   HTN: BP today elevated initially at 171/85 but then improved to 130/82 on repeat. She stopped taking her HCTZ as instructed. She has been taking Amlodipine 5 mg daily, Lisinopril 40 mg daily, and Lopressor 100 mg BID.   Chronic Bilateral Knee Pain: Has hx of OA in both knees. Reports she is having intense pain to the point where she has sit down to wash dishes and cannot walk far distances. She has been taking Gabapentin 100 mg TID and Tylenol 1000 mg BID. She states she does not have pain right now but she had pain earlier (8/10) when walking from her car to the clinic.   CKD: Her Cr has been in the 1.1-1.2 range for the last 2 years and then jumped up to 1.37 in March 2016. Repeat labs last week show her Cr has improved to 0.99. Her GFR has remained consistently in the 50-60 range. She denies any problems with urination and no hematuria.    Review of Systems General: Denies fever, chills, night sweats, changes in weight, changes in appetite HEENT: Denies headaches, ear pain, changes in vision, rhinorrhea, sore throat CV: Denies CP, palpitations, SOB,  orthopnea Pulm: Denies SOB, cough, wheezing GI: Denies abdominal pain, nausea, vomiting, diarrhea, constipation, melena, hematochezia GU: See HPI Msk: Denies muscle cramps Neuro: Denies weakness Skin: Denies rashes, bruising Psych: Denies depression, anxiety, hallucinations    Objective:   Physical Exam General: pleasant, obese woman sitting up, NAD HEENT: /AT, EOMI, sclera anicteric, mucus membranes moist CV: RRR, no m/g/r Pulm: CTA bilaterally, breaths non-labored Abd: BS+, soft, obese, non-tender Ext: warm, no edema. Distal pulses 2+ bilaterally. Neuro: alert and oriented x 3 Skin: Feet have no sores or ulcers present.     Assessment & Plan:  Please refer to A&P documentation.

## 2016-01-26 NOTE — Progress Notes (Signed)
Internal Medicine Clinic Attending  Case discussed with Dr. Rivet at the time of the visit.  We reviewed the resident's history and exam and pertinent patient test results.  I agree with the assessment, diagnosis, and plan of care documented in the resident's note.  

## 2016-02-10 ENCOUNTER — Ambulatory Visit: Payer: BLUE CROSS/BLUE SHIELD

## 2016-02-18 ENCOUNTER — Ambulatory Visit
Admission: RE | Admit: 2016-02-18 | Discharge: 2016-02-18 | Disposition: A | Discharge status: Home / Self Care | Payer: BLUE CROSS/BLUE SHIELD | Source: Ambulatory Visit | Attending: Internal Medicine | Admitting: Internal Medicine

## 2016-02-18 DIAGNOSIS — Z1231 Encounter for screening mammogram for malignant neoplasm of breast: Secondary | ICD-10-CM

## 2016-04-13 ENCOUNTER — Telehealth: Payer: Self-pay | Admitting: Dietician

## 2016-04-13 NOTE — Telephone Encounter (Signed)
Called patient to follow up on her diabetes self care and to remind her that she is due for an appointment.

## 2016-04-22 NOTE — Telephone Encounter (Signed)
Spoke with patient. She has been working a lot and is moving. She plans to schedule an appointment with the doctor when her life settles down. Encouraged her to call with questions or concerns.

## 2016-05-06 ENCOUNTER — Telehealth: Payer: Self-pay | Admitting: Internal Medicine

## 2016-05-06 ENCOUNTER — Ambulatory Visit (INDEPENDENT_AMBULATORY_CARE_PROVIDER_SITE_OTHER): Payer: BLUE CROSS/BLUE SHIELD | Admitting: Sports Medicine

## 2016-05-06 ENCOUNTER — Ambulatory Visit
Admission: RE | Admit: 2016-05-06 | Discharge: 2016-05-06 | Disposition: A | Discharge status: Home / Self Care | Payer: BLUE CROSS/BLUE SHIELD | Source: Ambulatory Visit | Attending: Sports Medicine | Admitting: Sports Medicine

## 2016-05-06 ENCOUNTER — Encounter: Payer: Self-pay | Admitting: Sports Medicine

## 2016-05-06 VITALS — BP 153/70 | HR 82 | Ht 64.0 in | Wt 303.0 lb

## 2016-05-06 DIAGNOSIS — M5416 Radiculopathy, lumbar region: Secondary | ICD-10-CM | POA: Insufficient documentation

## 2016-05-06 DIAGNOSIS — E113599 Type 2 diabetes mellitus with proliferative diabetic retinopathy without macular edema, unspecified eye: Secondary | ICD-10-CM

## 2016-05-06 DIAGNOSIS — M5441 Lumbago with sciatica, right side: Secondary | ICD-10-CM | POA: Diagnosis not present

## 2016-05-06 DIAGNOSIS — M170 Bilateral primary osteoarthritis of knee: Secondary | ICD-10-CM

## 2016-05-06 DIAGNOSIS — Z794 Long term (current) use of insulin: Principal | ICD-10-CM

## 2016-05-06 MED ORDER — METHYLPREDNISOLONE ACETATE 40 MG/ML IJ SUSP
40.0000 mg | Freq: Once | INTRAMUSCULAR | Status: AC
  Administered 2016-05-06: 40 mg via INTRA_ARTICULAR
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
## 2016-05-06 NOTE — Assessment & Plan Note (Signed)
Injections to bilateral knees performed. Patient tolerated procedure. Follow-up as needed.

## 2016-05-06 NOTE — Assessment & Plan Note (Signed)
Will get x-rays of the lumbar spine.  Continue gabapentin. Will call patient with the results and notified PCP. Can consider MRI if still having radicular pain. Discussed treatment options with patient in detail.

## 2016-05-08 NOTE — Telephone Encounter (Signed)
Patient needs appointment with me asap

## 2016-05-10 ENCOUNTER — Other Ambulatory Visit: Payer: Self-pay | Admitting: *Deleted

## 2016-05-10 ENCOUNTER — Ambulatory Visit: Payer: BLUE CROSS/BLUE SHIELD | Admitting: Sports Medicine

## 2016-05-10 DIAGNOSIS — M5416 Radiculopathy, lumbar region: Secondary | ICD-10-CM

## 2016-05-12 NOTE — Telephone Encounter (Signed)
Will forward appt request to front office to assist with scheduling pt an appt.Criss AlvineGoldston, Darlene Cassady8/30/201710:07 AM

## 2016-05-13 NOTE — Telephone Encounter (Signed)
Patient must have called us.  She already has an appt scheduled on 07-20-16 @ 1:15 pm.  Checked doctor's schedule and that is the first available date.  Sending message back to doctor to make her aware.

## 2016-05-21 ENCOUNTER — Ambulatory Visit
Admission: RE | Admit: 2016-05-21 | Discharge: 2016-05-21 | Disposition: A | Discharge status: Home / Self Care | Payer: BLUE CROSS/BLUE SHIELD | Source: Ambulatory Visit | Attending: Student | Admitting: Student

## 2016-05-21 DIAGNOSIS — M5416 Radiculopathy, lumbar region: Secondary | ICD-10-CM

## 2016-05-29 ENCOUNTER — Other Ambulatory Visit: Payer: Self-pay | Admitting: Internal Medicine

## 2016-05-31 NOTE — Telephone Encounter (Signed)
Patient needs appointment. Thanks

## 2016-05-31 NOTE — Telephone Encounter (Signed)
appt already scheduled for Nov 7th.Sheila Ferguson, Sheila Zhen Cassady9/18/201711:32 AM

## 2016-06-04 ENCOUNTER — Other Ambulatory Visit: Payer: Self-pay | Admitting: Internal Medicine

## 2016-06-04 DIAGNOSIS — E785 Hyperlipidemia, unspecified: Secondary | ICD-10-CM

## 2016-06-04 NOTE — Telephone Encounter (Signed)
appt 11/07 with pcp

## 2016-06-14 ENCOUNTER — Other Ambulatory Visit: Payer: Self-pay | Admitting: *Deleted

## 2016-06-14 ENCOUNTER — Telehealth: Payer: Self-pay | Admitting: *Deleted

## 2016-06-14 MED ORDER — TRAMADOL HCL 50 MG PO TABS: 50.0000 mg | ORAL_TABLET | Freq: Two times a day (BID) | ORAL | 1 refills | Status: DC

## 2016-06-14 NOTE — Telephone Encounter (Signed)
Per Dr. Margaretha Sheffieldraper, we will try some Tramadol 50mg  BID prn pain #60 with one RF   Per agreed

## 2016-06-29 ENCOUNTER — Encounter: Payer: Self-pay | Admitting: *Deleted

## 2016-07-20 ENCOUNTER — Encounter: Payer: BLUE CROSS/BLUE SHIELD | Admitting: Internal Medicine

## 2016-08-23 ENCOUNTER — Other Ambulatory Visit: Payer: Self-pay | Admitting: *Deleted

## 2016-08-23 MED ORDER — TRAMADOL HCL 50 MG PO TABS: 50.0000 mg | ORAL_TABLET | Freq: Two times a day (BID) | ORAL | 3 refills | Status: DC

## 2016-09-07 ENCOUNTER — Encounter (HOSPITAL_COMMUNITY): Payer: Self-pay

## 2016-09-07 ENCOUNTER — Emergency Department (HOSPITAL_COMMUNITY)
Admission: EM | Admit: 2016-09-07 | Discharge: 2016-09-07 | Disposition: A | Discharge status: Home / Self Care | Payer: BLUE CROSS/BLUE SHIELD | Attending: Emergency Medicine | Admitting: Emergency Medicine

## 2016-09-07 DIAGNOSIS — R21 Rash and other nonspecific skin eruption: Secondary | ICD-10-CM

## 2016-09-07 DIAGNOSIS — I129 Hypertensive chronic kidney disease with stage 1 through stage 4 chronic kidney disease, or unspecified chronic kidney disease: Secondary | ICD-10-CM | POA: Insufficient documentation

## 2016-09-07 DIAGNOSIS — Z794 Long term (current) use of insulin: Secondary | ICD-10-CM | POA: Insufficient documentation

## 2016-09-07 DIAGNOSIS — Z853 Personal history of malignant neoplasm of breast: Secondary | ICD-10-CM | POA: Insufficient documentation

## 2016-09-07 DIAGNOSIS — L299 Pruritus, unspecified: Secondary | ICD-10-CM | POA: Diagnosis present

## 2016-09-07 DIAGNOSIS — N182 Chronic kidney disease, stage 2 (mild): Secondary | ICD-10-CM | POA: Insufficient documentation

## 2016-09-07 DIAGNOSIS — E1122 Type 2 diabetes mellitus with diabetic chronic kidney disease: Secondary | ICD-10-CM | POA: Diagnosis not present

## 2016-09-07 MED ORDER — HYDROCORTISONE 2.5 % EX LOTN: TOPICAL_LOTION | Freq: Two times a day (BID) | CUTANEOUS | 0 refills | Status: DC

## 2016-09-07 MED ORDER — HYDROXYZINE HCL 10 MG PO TABS: 10.0000 mg | ORAL_TABLET | Freq: Four times a day (QID) | ORAL | 0 refills | Status: DC | PRN

## 2016-09-07 NOTE — ED Provider Notes (Signed)
[facility]-EMERGENCY DEPT Provider Note   CSN: [id number] Arrival date & time: 09/07/16  2151     History   Chief Complaint Chief Complaint  Patient presents with  . Allergic Reaction    HPI Sheila Ferguson is a 55 y.o. female.  Sheila Ferguson is a 55 y.o. Female who presents to the ED complaining of itching all over her body after using Dove soap 3 days ago. She reports her symptoms began immediately after using Dove soap 3 days ago. She reports feeling itchy all over. She reports since that began 3 days ago but it seems to be beginning to improve but she is still very itchy. She's used Benadryl with some relief of her symptoms. She is diabetic and reports her blood sugars have been good if not on the low side. She denies fevers, abdominal pain, nausea, vomiting, chest pain, shortness of breath, tongue swelling, lip swelling.    The history is provided by the patient. No language interpreter was used.  Allergic Reaction  Presenting symptoms: rash   Presenting symptoms: no difficulty swallowing     Past Medical History:  Diagnosis Date  . Anal fissure   . Anxiety   . Breast cancer (HCC)   . Congenital stricture of esophagus   . Depression   . Diabetes mellitus type II   . DIABETES MELLITUS, TYPE II, UNCONTROLLED 09/01/2007   04/10/13 eye exam neg proliferative DM retinopathy, +non proliferative DM retinopathy OS and OD +amblyopia OD   . Dysmetabolic syndrome   . Gall bladder disease    removed  . GERD (gastroesophageal reflux disease)   . Headache(784.0)   . Hypertension   . Sarcoid (HCC)   . Sleep apnea     Patient Active Problem List   Diagnosis Date Noted  . Lumbar radiculopathy, chronic 05/06/2016  . Bilateral thigh pain 10/07/2015  . CKD stage 2 due to type 2 diabetes mellitus (HCC) 09/04/2014  . Health care maintenance 10/05/2013  . Osteoarthritis of both knees 10/05/2013  . Hyperlipidemia 12/25/2012  . Anxiety and depression 12/28/2011  . MIGRAINE  HEADACHE 01/13/2008  . GERD 01/13/2008  . Essential hypertension 02/09/2007  . Diabetes mellitus type 2 with retinopathy (HCC) 08/31/2004    Past Surgical History:  Procedure Laterality Date  . ABDOMINAL HYSTERECTOMY    . CHOLECYSTECTOMY    . ESOPHAGEAL DILATION     x2  . OOPHORECTOMY     right, due to endometriosis    OB History    No data available       Home Medications    Prior to Admission medications   Medication Sig Start Date End Date Taking? Authorizing Provider  acetaminophen (TYLENOL) 500 MG tablet Take 2 tablets (1,000 mg total) by mouth 3 (three) times daily. 01/13/16   Carly Arlyce HarmanJ Rivet, MD  amLODipine (NORVASC) 5 MG tablet Take 1 tablet (5 mg total) by mouth daily. 01/23/16 01/22/17  Su Hoffarly J Rivet, MD  BAYER MICROLET LANCETS lancets Check blood sugar up to 5 times daily 01/23/16   Su Hoffarly J Rivet, MD  citalopram (CELEXA) 40 MG tablet Take 1 tablet (40 mg total) by mouth daily. 01/23/16   Carly Arlyce HarmanJ Rivet, MD  dextrose (RELION GLUCOSE) 40 % GEL Take 37.5 g by mouth once as needed for low blood sugar. 01/23/16   Carly Arlyce HarmanJ Rivet, MD  diclofenac sodium (VOLTAREN) 1 % GEL Apply 4 g topically 4 (four) times daily. 01/23/16   Carly Arlyce HarmanJ Rivet, MD  gabapentin (NEURONTIN) 300 MG  capsule TAKE ONE CAPSULE BY MOUTH THREE TIMES DAILY 05/08/16   Carly J Rivet, MD  glipiZIDE (GLUCOTROL) 10 MG tablet TAKE ONE TABLET BY MOUTH TWICE DAILY BEFORE MEAL(S) 01/23/16   Su Hoffarly J Rivet, MD  glucose blood (BAYER CONTOUR NEXT TEST) test strip Use to check blood sugar before meals and bedtime and anytime you fell like you are having a low blood sugar 01/23/16   Su Hoffarly J Rivet, MD  hydrocortisone (ANUSOL-HC) 25 MG suppository Place 1 suppository (25 mg total) rectally 2 (two) times daily. 10/01/15   Su Hoffarly J Rivet, MD  hydrocortisone 2.5 % lotion Apply topically 2 (two) times daily. 09/07/16   Everlene FarrierWilliam Tramaine Snell, PA-C  hydrOXYzine (ATARAX/VISTARIL) 10 MG tablet Take 1 tablet (10 mg total) by mouth every 6 (six) hours as  needed for itching. 09/07/16   Everlene FarrierWilliam Messina Kosinski, PA-C  insulin NPH Human (NOVOLIN N RELION) 100 UNIT/ML injection INJECT 30 UNITS SUBCUTANEOUSLY EACH MORNING AND 33 UNITS EACH EVENING 01/23/16   Su Hoffarly J Rivet, MD  Insulin Syringe-Needle U-100 31G X 15/64" 0.5 ML MISC Use to inject insulin two times a day 01/23/16   Su Hoffarly J Rivet, MD  lisinopril (PRINIVIL,ZESTRIL) 40 MG tablet Take 1 tablet (40 mg total) by mouth daily. 01/23/16   Carly Arlyce HarmanJ Rivet, MD  loratadine (CLARITIN) 10 MG tablet TAKE ONE TABLET BY MOUTH ONCE DAILY AS NEEDED FOR ALLERGIES 05/08/16   Carly J Rivet, MD  lovastatin (MEVACOR) 20 MG tablet TAKE ONE TABLET BY MOUTH AT BEDTIME 06/04/16   Carly J Rivet, MD  metFORMIN (GLUCOPHAGE) 500 MG tablet TAKE 2 TABLETS WITH BREAKFAST AND 1 TABLET WITH DINNER DAILY 10/01/15   Su Hoffarly J Rivet, MD  metoprolol (LOPRESSOR) 100 MG tablet Take 1 tablet (100 mg total) by mouth 2 (two) times daily. 01/23/16 01/19/17  Carly J Rivet, MD  NOVOLIN N RELION 100 UNIT/ML injection INJECT 33 UNITS SUBCUTANEOUSLY EACH MORNING AND 33 UNITS EACH EVENING ** CALL DONNA PLYER [phone] IF BLOOD GLUCOSE IS GREATER THAN 200 05/31/16   Carly J Rivet, MD  omeprazole (PRILOSEC) 40 MG capsule Take 1 capsule (40 mg total) by mouth daily. 01/23/16   Carly Arlyce HarmanJ Rivet, MD  traMADol (ULTRAM) 50 MG tablet Take 1 tablet (50 mg total) by mouth 2 (two) times daily. 08/23/16   Ralene Corkimothy R Draper, DO    Family History Family History  Problem Relation Age of Onset  . Sarcoidosis Father   . Leukemia Father   . Hypertension Mother   . Diabetes Mother   . Heart attack Mother   . Aneurysm Mother     CNS  . Hypertension Sister   . Diabetes Sister   . Sleep apnea Sister     Social History Social History  Substance Use Topics  . Smoking status: Never Smoker  . Smokeless tobacco: Never Used  . Alcohol use No     Allergies   Azithromycin   Review of Systems Review of Systems  Constitutional: Negative for fever.  HENT: Negative for facial  swelling and trouble swallowing.   Respiratory: Negative for cough and shortness of breath.   Cardiovascular: Negative for chest pain.  Gastrointestinal: Negative for abdominal pain, nausea and vomiting.  Skin: Positive for rash.     Physical Exam Updated Vital Signs BP 170/100 (BP Location: Right Arm)   Pulse 106   Temp 99 F (37.2 C) (Oral)   Resp 17   Ht 5\' 4"  (1.626 m)   Wt (!) 139.7 kg   SpO2 97%  BMI 52.87 kg/m   Physical Exam  Constitutional: She appears well-developed and well-nourished. No distress.  Nontoxic appearing.  HENT:  Head: Normocephalic and atraumatic.  Right Ear: External ear normal.  Left Ear: External ear normal.  Mouth/Throat: Oropharynx is clear and moist.  No tongue or lip swelling.  Eyes: Pupils are equal, round, and reactive to light. Right eye exhibits no discharge. Left eye exhibits no discharge. No scleral icterus.  Neck: Neck supple.  Cardiovascular: Normal rate, regular rhythm and intact distal pulses.   Pulmonary/Chest: Effort normal and breath sounds normal. No respiratory distress. She has no wheezes. She has no rales.  Abdominal: Soft. There is no tenderness.  Lymphadenopathy:    She has no cervical adenopathy.  Neurological: She is alert. Coordination normal.  Skin: Skin is warm and dry. Capillary refill takes less than 2 seconds. No rash noted. She is not diaphoretic. No erythema. No pallor.  Only rash noted is a small area of urticaria noted to her right upper back. No vesicles or bulla. No other rashes noted.   Psychiatric: She has a normal mood and affect. Her behavior is normal.  Nursing note and vitals reviewed.    ED Treatments / Results  Labs (all labs ordered are listed, but only abnormal results are displayed) Labs Reviewed - No data to display  EKG  EKG Interpretation None       Radiology No results found.  Procedures Procedures (including critical care time)  Medications Ordered in ED Medications - No  data to display   Initial Impression / Assessment and Plan / ED Course  I have reviewed the triage vital signs and the nursing notes.  Pertinent labs & imaging results that were available during my care of the patient were reviewed by me and considered in my medical decision making (see chart for details).  Clinical Course    This is a 55 y.o. Female who presents to the ED complaining of itching all over her body after using Dove soap 3 days ago. She reports her symptoms began immediately after using Dove soap 3 days ago. She reports feeling itchy all over. She reports since that began 3 days ago but it seems to be beginning to improve but she is still very itchy. She's used Benadryl with some relief of her symptoms. On exam the patient is afebrile nontoxic appearing. Only rash noted is a small area of urticaria noted to her right upper back. No other rashes noted. No icterus. No vesicles or bulla. Patient reports improvement over the past several days. Will provide with steroid cream and Atarax for itching. Encouraged her to follow-up with primary care. I discussed return precautions. I advised the patient to follow-up with their primary care provider this week. I advised the patient to return to the emergency department with new or worsening symptoms or new concerns. The patient verbalized understanding and agreement with plan.    Final Clinical Impressions(s) / ED Diagnoses   Final diagnoses:  Rash and nonspecific skin eruption    New Prescriptions New Prescriptions   HYDROCORTISONE 2.5 % LOTION    Apply topically 2 (two) times daily.   HYDROXYZINE (ATARAX/VISTARIL) 10 MG TABLET    Take 1 tablet (10 mg total) by mouth every 6 (six) hours as needed for itching.     Everlene Farrier, PA-C 09/07/16 2345    Lorre Nick, MD 09/08/16 [phone]

## 2016-09-07 NOTE — ED Triage Notes (Signed)
Pt states that she changed soap Friday and started to itch and then washed again in her normal soap, she also has changed her eating habits. She took childrens liquid benedryl and it helped with the whelps but not the itching

## 2016-09-22 ENCOUNTER — Other Ambulatory Visit: Payer: Self-pay | Admitting: Internal Medicine

## 2016-09-22 DIAGNOSIS — Z794 Long term (current) use of insulin: Principal | ICD-10-CM

## 2016-09-22 DIAGNOSIS — E113599 Type 2 diabetes mellitus with proliferative diabetic retinopathy without macular edema, unspecified eye: Secondary | ICD-10-CM

## 2016-09-22 DIAGNOSIS — I10 Essential (primary) hypertension: Secondary | ICD-10-CM

## 2016-09-23 ENCOUNTER — Encounter: Payer: Self-pay | Admitting: Gastroenterology

## 2016-10-19 ENCOUNTER — Telehealth: Payer: Self-pay | Admitting: Internal Medicine

## 2016-10-19 DIAGNOSIS — K219 Gastro-esophageal reflux disease without esophagitis: Secondary | ICD-10-CM

## 2016-10-20 ENCOUNTER — Telehealth: Payer: Self-pay

## 2016-10-20 NOTE — Telephone Encounter (Signed)
Patient must have called in.  [facility]

## 2016-10-20 NOTE — Telephone Encounter (Signed)
Patient needs an appointment

## 2016-10-21 ENCOUNTER — Telehealth: Payer: Self-pay | Admitting: Internal Medicine

## 2016-10-21 NOTE — Telephone Encounter (Signed)
Calling to confirm appt for 10/22/16 at 9:15 °

## 2016-10-22 ENCOUNTER — Encounter: Payer: Self-pay | Admitting: Dietician

## 2016-10-22 ENCOUNTER — Encounter (INDEPENDENT_AMBULATORY_CARE_PROVIDER_SITE_OTHER): Payer: Self-pay

## 2016-10-22 ENCOUNTER — Ambulatory Visit (INDEPENDENT_AMBULATORY_CARE_PROVIDER_SITE_OTHER): Payer: BLUE CROSS/BLUE SHIELD | Admitting: Pulmonary Disease

## 2016-10-22 VITALS — BP 152/84 | HR 88 | Temp 98.0°F | Wt 302.1 lb

## 2016-10-22 DIAGNOSIS — Z79899 Other long term (current) drug therapy: Secondary | ICD-10-CM

## 2016-10-22 DIAGNOSIS — E113599 Type 2 diabetes mellitus with proliferative diabetic retinopathy without macular edema, unspecified eye: Secondary | ICD-10-CM

## 2016-10-22 DIAGNOSIS — Z794 Long term (current) use of insulin: Secondary | ICD-10-CM

## 2016-10-22 DIAGNOSIS — I10 Essential (primary) hypertension: Secondary | ICD-10-CM | POA: Diagnosis not present

## 2016-10-22 DIAGNOSIS — E11319 Type 2 diabetes mellitus with unspecified diabetic retinopathy without macular edema: Secondary | ICD-10-CM

## 2016-10-22 DIAGNOSIS — K219 Gastro-esophageal reflux disease without esophagitis: Secondary | ICD-10-CM | POA: Diagnosis not present

## 2016-10-22 DIAGNOSIS — Z6841 Body Mass Index (BMI) 40.0 and over, adult: Secondary | ICD-10-CM

## 2016-10-22 LAB — GLUCOSE, CAPILLARY: GLUCOSE-CAPILLARY: 234 mg/dL — AB (ref 65–99)

## 2016-10-22 LAB — POCT GLYCOSYLATED HEMOGLOBIN (HGB A1C): HEMOGLOBIN A1C: 9.7

## 2016-10-22 MED ORDER — OMEPRAZOLE 40 MG PO CPDR: 40.0000 mg | DELAYED_RELEASE_CAPSULE | Freq: Every day | ORAL | 1 refills | Status: DC

## 2016-10-22 MED ORDER — DICLOFENAC SODIUM 1 % TD GEL: 2.0000 g | Freq: Four times a day (QID) | TRANSDERMAL | 0 refills | Status: DC

## 2016-10-22 MED ORDER — AMLODIPINE BESYLATE 10 MG PO TABS: 10.0000 mg | ORAL_TABLET | Freq: Every day | ORAL | 1 refills | Status: DC

## 2016-10-22 MED ORDER — METFORMIN HCL ER 500 MG PO TB24: 2000.0000 mg | ORAL_TABLET | Freq: Every day | ORAL | 1 refills | Status: DC

## 2016-10-22 NOTE — Patient Instructions (Addendum)
Take two tablets of amlodipine per day until you run out. Your new prescription of amlodipine will be a higher dose so you can go back to one tablet a day. Please follow up in 4-6 weeks

## 2016-10-22 NOTE — Progress Notes (Signed)
Followed up to ophthalmology referral in 2016. Patient did not go to the appointment with Dr,. Nile RiggsShapiro, but [facility] [place] 3 months ago. She was not told that diabetes was affecting her eyes.  P: ask medical records to request results from recent eye doctor visit.

## 2016-10-22 NOTE — Progress Notes (Signed)
   CC: Heartburn follow up  HPI:  Sheila Ferguson is a 56 y.o. woman with history as noted below here for GERD follow up.  Her reflux is usually controlled by medication but is now uncontrolled because she ran out of her medication.  She is taking 30u in the morning and 33u in the evening of her insulin. Does experience some episodes of hypoglycemia but more often has hyperglycemia.  Past Medical History:  Diagnosis Date  . Anal fissure   . Anxiety   . Breast cancer (HCC)   . Congenital stricture of esophagus   . Depression   . Diabetes mellitus type II   . DIABETES MELLITUS, TYPE II, UNCONTROLLED 09/01/2007   04/10/13 eye exam neg proliferative DM retinopathy, +non proliferative DM retinopathy OS and OD +amblyopia OD   . Dysmetabolic syndrome   . Gall bladder disease    removed  . GERD (gastroesophageal reflux disease)   . Headache(784.0)   . Hypertension   . Sarcoid (HCC)   . Sleep apnea     Review of Systems:   No fevers or chills No chest pain  Physical Exam:  BP (!) 152/78 (BP Location: Left Arm, Patient Position: Sitting, Cuff Size: Large)   Pulse 90   Temp 98 F (36.7 C) (Oral)   Wt (!) 302 lb 1.6 oz (137 kg)   BMI 51.86 kg/m   General Apperance: NAD HEENT: Normocephalic, atraumatic, anicteric sclera Neck: Supple, trachea midline Lungs: Clear to auscultation bilaterally. No wheezes, rhonchi or rales. Breathing comfortably Heart: Regular rate and rhythm, no murmur/rub/gallop Abdomen: Soft, nontender, nondistended, no rebound/guarding Extremities: Warm and well perfused, no edema Skin: No rashes or lesions Neurologic: Alert and interactive. No gross deficits.   Assessment & Plan:   See Encounters Tab for problem based charting.  Patient discussed with Dr. Cyndie ChimeGranfortuna

## 2016-10-22 NOTE — Assessment & Plan Note (Signed)
GERD usually controlled on omeprazole. Ran out. Refill sent.

## 2016-10-22 NOTE — Assessment & Plan Note (Signed)
Assessment: Hgb A1c deteriorated to 9.7 from 8.8.  Plan: Change metformin to XR formulation and attempt to increase to 2000mg  daily Continue glipizide 10mg  BID Continue NPH Human 30u in the morning and 33u in the evening. Will look into adding another class of diabetes medications - GLP1 RA or SGLT2 inhibitors

## 2016-10-22 NOTE — Assessment & Plan Note (Signed)
BP 150s systolic on both checks.   Increase amlodipine to 10mg  daily Continue lisinopril 40mg  daily Continue metoprolol 100mg  BID Follow up in 4-6 weeks.

## 2016-10-22 NOTE — Progress Notes (Signed)
Medicine attending: Medical history, presenting problems, physical findings, and medications, reviewed with resident physician Dr Jennifer Krall on the day of the patient visit and I concur with her evaluation and management plan. 

## 2016-10-22 NOTE — Assessment & Plan Note (Signed)
Assessment: She is interested in sleeve gastrectomy. She would qualify based on BMI.  Plan: Referral placed for bariatric surgery.

## 2016-10-23 LAB — LIPID PANEL
CHOLESTEROL TOTAL: 171 mg/dL (ref 100–199)
Chol/HDL Ratio: 3.4 ratio units (ref 0.0–4.4)
HDL: 51 mg/dL (ref >39)
LDL CALC: 92 mg/dL (ref 0–99)
Triglycerides: 141 mg/dL (ref 0–149)
VLDL CHOLESTEROL CAL: 28 mg/dL (ref 5–40)

## 2016-10-25 ENCOUNTER — Other Ambulatory Visit: Payer: Self-pay | Admitting: Pharmacist

## 2016-10-25 ENCOUNTER — Telehealth: Payer: Self-pay | Admitting: Pharmacist

## 2016-10-25 DIAGNOSIS — E113599 Type 2 diabetes mellitus with proliferative diabetic retinopathy without macular edema, unspecified eye: Secondary | ICD-10-CM

## 2016-10-25 DIAGNOSIS — Z794 Long term (current) use of insulin: Secondary | ICD-10-CM

## 2016-10-25 DIAGNOSIS — I10 Essential (primary) hypertension: Secondary | ICD-10-CM

## 2016-10-25 MED ORDER — LIRAGLUTIDE 18 MG/3ML ~~LOC~~ SOPN: 1.2000 mg | PEN_INJECTOR | Freq: Every day | SUBCUTANEOUS | 3 refills | Status: DC

## 2016-10-25 MED ORDER — EMPAGLIFLOZIN 10 MG PO TABS: 10.0000 mg | ORAL_TABLET | Freq: Every day | ORAL | 3 refills | Status: DC

## 2016-10-29 MED ORDER — GLIPIZIDE 10 MG PO TABS: 10.0000 mg | ORAL_TABLET | Freq: Two times a day (BID) | ORAL | 3 refills | Status: DC

## 2016-10-29 MED ORDER — GLIPIZIDE-METFORMIN HCL 5-500 MG PO TABS: 2.0000 | ORAL_TABLET | Freq: Two times a day (BID) | ORAL | 1 refills | Status: DC

## 2016-10-29 MED ORDER — AMLODIPINE BESYLATE-VALSARTAN 10-320 MG PO TABS: 1.0000 | ORAL_TABLET | Freq: Every day | ORAL | 3 refills | Status: DC

## 2016-10-29 MED ORDER — LISINOPRIL 40 MG PO TABS: 40.0000 mg | ORAL_TABLET | Freq: Every day | ORAL | 3 refills | Status: DC

## 2016-10-29 MED ORDER — GLIPIZIDE-METFORMIN HCL 5-500 MG PO TABS: 2.0000 | ORAL_TABLET | Freq: Two times a day (BID) | ORAL | 3 refills | Status: DC

## 2016-10-29 MED ORDER — METFORMIN HCL ER 500 MG PO TB24: 2000.0000 mg | ORAL_TABLET | Freq: Every day | ORAL | 3 refills | Status: DC

## 2016-10-29 MED ORDER — AMLODIPINE BESYLATE 10 MG PO TABS: 10.0000 mg | ORAL_TABLET | Freq: Every day | ORAL | 3 refills | Status: DC

## 2016-10-29 MED ORDER — AMLODIPINE BESYLATE-VALSARTAN 10-320 MG PO TABS: 1.0000 | ORAL_TABLET | Freq: Every day | ORAL | 1 refills | Status: DC

## 2016-10-29 NOTE — Telephone Encounter (Signed)
Worked with Dr. Isabella BowensKrall to initiate empagliflozin for patient, covered by insurance and patient states it is affordable. Tried combining medications for less copayments, but combinations not covered by insurance. Patient notified.

## 2016-11-10 ENCOUNTER — Telehealth: Payer: Self-pay | Admitting: *Deleted

## 2016-11-10 ENCOUNTER — Other Ambulatory Visit: Payer: Self-pay | Admitting: Internal Medicine

## 2016-11-10 DIAGNOSIS — I10 Essential (primary) hypertension: Secondary | ICD-10-CM

## 2016-11-10 NOTE — Telephone Encounter (Signed)
Information was sent to CoverMyMeds for PA for Diclofenac Gel.  Approved 11/10/2016 thru 09/12/2038.  Angelina OkGladys Navon Kotowski, RN 11/10/2016 9:12 AM

## 2016-11-11 ENCOUNTER — Other Ambulatory Visit: Payer: Self-pay | Admitting: Internal Medicine

## 2016-11-11 DIAGNOSIS — M170 Bilateral primary osteoarthritis of knee: Secondary | ICD-10-CM

## 2016-11-11 NOTE — Telephone Encounter (Signed)
Needs appointment for BP recheck in mid March

## 2016-11-11 NOTE — Telephone Encounter (Signed)
Needs appointment for mid-March or April

## 2016-11-24 ENCOUNTER — Ambulatory Visit: Payer: BLUE CROSS/BLUE SHIELD

## 2016-11-29 ENCOUNTER — Encounter: Payer: Self-pay | Admitting: Internal Medicine

## 2016-11-29 ENCOUNTER — Ambulatory Visit (INDEPENDENT_AMBULATORY_CARE_PROVIDER_SITE_OTHER): Payer: BLUE CROSS/BLUE SHIELD | Admitting: Internal Medicine

## 2016-11-29 DIAGNOSIS — M549 Dorsalgia, unspecified: Secondary | ICD-10-CM | POA: Insufficient documentation

## 2016-11-29 DIAGNOSIS — E882 Lipomatosis, not elsewhere classified: Secondary | ICD-10-CM

## 2016-11-29 DIAGNOSIS — M4807 Spinal stenosis, lumbosacral region: Secondary | ICD-10-CM | POA: Diagnosis not present

## 2016-11-29 DIAGNOSIS — M5117 Intervertebral disc disorders with radiculopathy, lumbosacral region: Secondary | ICD-10-CM | POA: Diagnosis not present

## 2016-11-29 DIAGNOSIS — M4316 Spondylolisthesis, lumbar region: Secondary | ICD-10-CM

## 2016-11-29 DIAGNOSIS — G8929 Other chronic pain: Secondary | ICD-10-CM

## 2016-11-29 DIAGNOSIS — I10 Essential (primary) hypertension: Secondary | ICD-10-CM

## 2016-11-29 DIAGNOSIS — M5126 Other intervertebral disc displacement, lumbar region: Secondary | ICD-10-CM

## 2016-11-29 DIAGNOSIS — M545 Low back pain, unspecified: Secondary | ICD-10-CM

## 2016-11-29 DIAGNOSIS — Z79899 Other long term (current) drug therapy: Secondary | ICD-10-CM

## 2016-11-29 MED ORDER — CYCLOBENZAPRINE HCL 10 MG PO TABS: 10.0000 mg | ORAL_TABLET | Freq: Three times a day (TID) | ORAL | 2 refills | Status: DC | PRN

## 2016-11-29 NOTE — Progress Notes (Signed)
Internal Medicine Clinic Attending  Case discussed with Dr. Ahmed at the time of the visit.  We reviewed the resident's history and exam and pertinent patient test results.  I agree with the assessment, diagnosis, and plan of care documented in the resident's note. 

## 2016-11-29 NOTE — Telephone Encounter (Deleted)
   Reason for call:   I received a call from [name] at 6 AM AM indicating below   Pertinent Data:   Have tried to call back multiple times and left a voicemail asking the pt to call back   Assessment / Plan / Recommendations:     As always, pt is advised that if symptoms worsen or new symptoms arise, they should go to an urgent care facility or to to ER for further evaluation.   Sheila LeverParth Glee Lashomb, MD   11/29/2016, 6:59 AM

## 2016-11-29 NOTE — Assessment & Plan Note (Signed)
on lisinopril 40mg  + amlodipine 10 mg + metoprolol 100mg  bid. Hasn't taken her meds today (BP meds and others).   Vitals:   11/29/16 0940  BP: (!) 146/92  Pulse: 78  Temp: 98.2 F (36.8 C)   BP slightly above goal. Cont current regimen.

## 2016-11-29 NOTE — Progress Notes (Signed)
   CC: lower back pain  HPI:  Sheila Ferguson is a 56 y.o. with pmh as listed below is here for 10/10 lower back pain.  MRI L spine 05/2016: IMPRESSION: 1. Advanced facet hypertrophy bilaterally at L4-5 with grade 1 anterolisthesis and uncovering of a broad-based disc protrusion. 2. Mild foraminal narrowing bilaterally at L4-5. 3. Epidural lipomatosis with crowding of the nerve roots at the L5-S1 level. 4. Mild left foraminal stenosis at L5-S1.   Has chronic lower back pain 6/10 b/l but last night around 9 PM she got "locked in" while cooking and washing clothes and had increased severity of the pain. Friday twisted Left knee, could be from that. Gabapentin helps somewhat, takes tylenol too along with tramadol when needed. She took all of these last night. Tried heat, Bengay, electric back vibrator, without any help with the acute episode. No bowel or bladder incontinence.   HTN: on lisinopril 40mg  + amlodipine 10 mg + metoprolol 100mg  bid. Hasn't taken her meds today (BP meds and others).     Past Medical History:  Diagnosis Date  . Anal fissure   . Anxiety   . Breast cancer (HCC)   . Congenital stricture of esophagus   . Depression   . Diabetes mellitus type II   . DIABETES MELLITUS, TYPE II, UNCONTROLLED 09/01/2007   04/10/13 eye exam neg proliferative DM retinopathy, +non proliferative DM retinopathy OS and OD +amblyopia OD   . Dysmetabolic syndrome   . Gall bladder disease    removed  . GERD (gastroesophageal reflux disease)   . Headache(784.0)   . Hypertension   . Sarcoid (HCC)   . Sleep apnea      Review of Systems:   Review of Systems  Constitutional: Negative for chills and fever.  Cardiovascular: Negative for chest pain and palpitations.  Genitourinary: Negative for dysuria and frequency.  Musculoskeletal: Positive for back pain and joint pain.  Neurological: Negative for dizziness and headaches.     Physical Exam:  Vitals:   11/29/16 0940    BP: (!) 146/92  Pulse: 78  Temp: 98.2 F (36.8 C)  TempSrc: Oral  SpO2: 99%  Weight: 296 lb 4.8 oz (134.4 kg)  Height: 5\' 4"  (1.626 m)   Physical Exam  Constitutional: She is oriented to person, place, and time. She appears well-developed and well-nourished.  Obese female. Moans due to pain with leg movement.   HENT:  Head: Normocephalic and atraumatic.  Respiratory: Breath sounds normal. No respiratory distress. She has no wheezes.  Musculoskeletal:  Limited ROM of hip joint due to back pain. No spinous process or paraspinal muscle tenderness.   Neurological: She is alert and oriented to person, place, and time.    Assessment & Plan:   See Encounters Tab for problem based charting.  Patient discussed with Dr. Rogelia BogaButcher

## 2016-11-29 NOTE — Patient Instructions (Addendum)
Please take Flexeril muscle relaxer in addition to your other pain medications.  Follow up as needed for this.  Continue your BP meds.  Follow up with Dr. Beckie Saltsivet in 1 month.

## 2016-11-29 NOTE — Assessment & Plan Note (Addendum)
Patient has chronic sciatica pain from L spine stenosis but she now has acute pain from twisting her left knee and putting pressure on her back due to the back pain. No alarming signs.   - will do supportive care with flexeril -cont her chronic pain regimen: gabapentin, prn tramadol, prn tylenol. Heat and Bengay also encouraged. -gave work note to return to work on 12/01/16.

## 2016-11-30 ENCOUNTER — Telehealth: Payer: Self-pay | Admitting: Internal Medicine

## 2016-11-30 NOTE — Telephone Encounter (Signed)
   Reason for call:   I received a call from Ms. Mazy N Dutan at 6 AM AM indicating below   Pertinent Data:   Have tried to call back multiple times and left a voicemail asking the pt to call back   Assessment / Plan / Recommendations:     As always, pt is advised that if symptoms worsen or new symptoms arise, they should go to an urgent care facility or to to ER for further evaluation.   Braheem Tomasik, MD   11/29/2016, 6:59 AM   

## 2016-12-09 ENCOUNTER — Encounter: Payer: Self-pay | Admitting: Pulmonary Disease

## 2016-12-29 ENCOUNTER — Other Ambulatory Visit: Payer: Self-pay | Admitting: Internal Medicine

## 2017-01-03 ENCOUNTER — Telehealth: Payer: Self-pay | Admitting: Internal Medicine

## 2017-01-03 NOTE — Telephone Encounter (Signed)
APT. REMINDER CALL, LMTCB °

## 2017-01-04 ENCOUNTER — Encounter: Payer: BLUE CROSS/BLUE SHIELD | Admitting: Internal Medicine

## 2017-01-10 ENCOUNTER — Telehealth: Payer: Self-pay | Admitting: Internal Medicine

## 2017-01-10 NOTE — Telephone Encounter (Signed)
APT. REMINDER CALL, LMTCB °

## 2017-01-11 ENCOUNTER — Ambulatory Visit (INDEPENDENT_AMBULATORY_CARE_PROVIDER_SITE_OTHER): Payer: BLUE CROSS/BLUE SHIELD | Admitting: Internal Medicine

## 2017-01-11 ENCOUNTER — Encounter: Payer: Self-pay | Admitting: Internal Medicine

## 2017-01-11 VITALS — BP 142/74 | HR 78 | Temp 98.1°F | Ht 64.0 in | Wt 302.9 lb

## 2017-01-11 DIAGNOSIS — E113219 Type 2 diabetes mellitus with mild nonproliferative diabetic retinopathy with macular edema, unspecified eye: Secondary | ICD-10-CM

## 2017-01-11 DIAGNOSIS — I10 Essential (primary) hypertension: Secondary | ICD-10-CM

## 2017-01-11 DIAGNOSIS — Z0000 Encounter for general adult medical examination without abnormal findings: Secondary | ICD-10-CM

## 2017-01-11 DIAGNOSIS — Z794 Long term (current) use of insulin: Secondary | ICD-10-CM

## 2017-01-11 DIAGNOSIS — M25562 Pain in left knee: Secondary | ICD-10-CM

## 2017-01-11 DIAGNOSIS — Z79899 Other long term (current) drug therapy: Secondary | ICD-10-CM

## 2017-01-11 DIAGNOSIS — E11319 Type 2 diabetes mellitus with unspecified diabetic retinopathy without macular edema: Secondary | ICD-10-CM | POA: Diagnosis not present

## 2017-01-11 DIAGNOSIS — M25561 Pain in right knee: Secondary | ICD-10-CM | POA: Diagnosis not present

## 2017-01-11 DIAGNOSIS — Z1211 Encounter for screening for malignant neoplasm of colon: Secondary | ICD-10-CM

## 2017-01-11 DIAGNOSIS — Z6841 Body Mass Index (BMI) 40.0 and over, adult: Secondary | ICD-10-CM

## 2017-01-11 LAB — GLUCOSE, CAPILLARY: Glucose-Capillary: 109 mg/dL — ABNORMAL HIGH (ref 65–99)

## 2017-01-11 LAB — POCT GLYCOSYLATED HEMOGLOBIN (HGB A1C): Hemoglobin A1C: 9.7

## 2017-01-11 MED ORDER — EMPAGLIFLOZIN 10 MG PO TABS: 10.0000 mg | ORAL_TABLET | Freq: Every day | ORAL | 3 refills | Status: DC

## 2017-01-11 NOTE — Progress Notes (Signed)
   CC: Follow up for diabetes  HPI:  Sheila Ferguson is a 56 y.o. woman with PMHx as noted below who presents today for follow up of her diabetes.  Type 2 DM: Last A1c 9.7. Today her A1c has remained unchanged at 9.7. She is taking Glipizide 10 mg BID, Metformin 2000 mg daily, and Novolin N 30 units in AM and 33 units in PM. She was prescribed Empagliflozin at her last visit but she was unaware of this medication. She denies any recent episodes of hypoglycemia. She has not been testing her blood sugars very much as only a few readings are on her glucometer, most in the 200s-300s. She reports she did get an eye exam recently.   HTN: BP elevated at 150/64. Repeat BP 142/74. She is taking Amlodipine 10 mg daily, Lisinopril 40 mg daily, and Lopressor 100 mg BID. She admits to eating a lot of salty foods and feels she could improve on this.  Morbid Obesity: BMI 52. Her weight is up 6 lbs from her last visit. She reports she has seen a bariatric surgeon and is going through the process to be a candidate for a gastric sleeve procedure. She brought in paperwork with her today and would like help filling out some of the medical aspects that she does not understand. She admits she has been eating poorly lately and that she has a difficult time staying motivated with dietary changes. She has tried to meal prep in the past but found this burdensome. She would like to exercise but feels limited by her bilateral knee pain.   Past Medical History:  Diagnosis Date  . Anal fissure   . Anxiety   . Breast cancer (HCC)   . Congenital stricture of esophagus   . Depression   . Diabetes mellitus type II   . DIABETES MELLITUS, TYPE II, UNCONTROLLED 09/01/2007   04/10/13 eye exam neg proliferative DM retinopathy, +non proliferative DM retinopathy OS and OD +amblyopia OD   . Dysmetabolic syndrome   . Gall bladder disease    removed  . GERD (gastroesophageal reflux disease)   . Headache(784.0)   .  Hypertension   . Sarcoid (HCC)   . Sleep apnea     Review of Systems:   All negative except per HPI  Physical Exam:  Vitals:   01/11/17 1528  BP: (!) 150/64  Pulse: 82  Temp: 98.1 F (36.7 C)  TempSrc: Oral  SpO2: 99%  Weight: (!) 302 lb 14.4 oz (137.4 kg)  Height:  (1.626 m)   General: Morbidly obese woman in NAD CV: RRR, no m/g/r  Assessment & Plan:   See Encounters Tab for problem based charting.  Patient discussed with Dr. Cleda Daub

## 2017-01-11 NOTE — Patient Instructions (Signed)
General Instructions: - Start Empagliflozin 10 mg daily - Will have Lupita Leash call you to set up appointment for continuous glucose monitor - Follow up with me in 3 weeks - Referral placed for colonoscopy- Dr. Ardell Isaacs office will call you to set up  Thank you for bringing your medicines today. This helps [place] keep you safe from mistakes.   Progress Toward Treatment Goals:  Treatment Goal 10/06/2015  Hemoglobin A1C deteriorated  Blood pressure deteriorated  Prevent falls -    Self Care Goals & Plans:  Self Care Goal 01/11/2017  Manage my medications take my medicines as prescribed; bring my medications to every visit; refill my medications on time  Monitor my health keep track of my blood pressure; bring my glucose meter and log to each visit; check my feet daily; keep track of my blood glucose  Eat healthy foods eat more vegetables; eat baked foods instead of fried foods; eat foods that are low in salt  Be physically active find an activity I enjoy  Meeting treatment goals -    Home Blood Glucose Monitoring 04/15/2015  Check my blood sugar 2 times a day  When to check my blood sugar before meals     Care Management & Community Referrals:  Referral 10/17/2014  Referrals made for care management support none needed  Referrals made to community resources none

## 2017-01-12 ENCOUNTER — Encounter: Payer: Self-pay | Admitting: Gastroenterology

## 2017-01-12 NOTE — Assessment & Plan Note (Signed)
Referral placed for colonoscopy. 

## 2017-01-12 NOTE — Assessment & Plan Note (Signed)
She has a BMI of 52 with comorbidities of DM and HTN. I helped her fill out some of the paperwork required by the bariatric surgery assessment. Encouraged her to work on her dietary intake as this will be an important aspect if she does undergo bariatric surgery. She will follow up with bariatric soon.

## 2017-01-12 NOTE — Assessment & Plan Note (Signed)
Her A1c has remained elevated at 9.7. She did not start the Empagliflozin that was prescribed last visit. Advised her to start Empagliflozin 10 mg daily. We also discussed using the continuous glucose monitoring to evaluate her blood sugars in more detail and she was agreeable to wearing this for 2 weeks. Will have her see Lupita Leash for placement. She can then follow up with me for first download. Recommended that she work on her dietary intake as weight loss is key for getting her diabetes under control. We discussed some improvements that she could make, including eating more vegetables and less simple carbs such as white bread and rice. Will have her continue Metformin 2000 mg daily, Glipizide 10 mg BID, and Novolin N 30 units in AM and 33 units PM. We also discussed that if she is able to undergo weight loss surgery this could potentially cure her diabetes as well.

## 2017-01-12 NOTE — Assessment & Plan Note (Signed)
BP mildly elevated. We discussed trying to follow a low salt diet to see if this could improve her BP. She will follow up with me in a few weeks.

## 2017-01-17 ENCOUNTER — Telehealth: Payer: Self-pay | Admitting: Dietician

## 2017-01-17 ENCOUNTER — Encounter: Payer: Self-pay | Admitting: Internal Medicine

## 2017-01-17 NOTE — Telephone Encounter (Signed)
Spoke to patient on phone about CGM(Continuous Glucose Monitoring). Informed her that we are happy to place CGM, but do not know how her insurance company will cover it. Gave her CPT codes for CGM. She wants to call her insurance company to find out her cost and will call me back to schedule it if she decides to have it placed.

## 2017-01-18 NOTE — Progress Notes (Signed)
Internal Medicine Clinic Attending  Case discussed with Dr. Rivet at the time of the visit.  We reviewed the resident's history and exam and pertinent patient test results.  I agree with the assessment, diagnosis, and plan of care documented in the resident's note.  

## 2017-01-20 NOTE — Telephone Encounter (Signed)
Left message for patient to call to schedule. 

## 2017-01-20 NOTE — Telephone Encounter (Signed)
Patient wants to schedule CGM placement.  Calling to schedule. Left message for return call.

## 2017-01-24 NOTE — Telephone Encounter (Signed)
Spoke with patient. She is not ready to schedule because of a work conflict. She will call when she can schedule a time to place CGM.

## 2017-02-22 ENCOUNTER — Encounter: Payer: Self-pay | Admitting: *Deleted

## 2017-02-24 ENCOUNTER — Telehealth: Payer: Self-pay

## 2017-02-25 NOTE — Telephone Encounter (Signed)
Dr Russella DarStark,      BMI>50  Would you like me to have your nurse schedule as direct [facility] pt or would you like her seen in clinic first? Thank you,      Harriett SineNancy Chayim Bialas/PV

## 2017-02-27 NOTE — Telephone Encounter (Signed)
OK for direct at [facility]

## 2017-02-28 ENCOUNTER — Telehealth: Payer: Self-pay

## 2017-02-28 NOTE — Telephone Encounter (Signed)
Sheri,  Please schedule patient at the hospital per Dr. Russella DarStark. Thanks.   Janalee DaneNancy Carol Loftin, LPN

## 2017-02-28 NOTE — Telephone Encounter (Signed)
No hospital days available at this time for Dr. Russella DarStark through the end of August.

## 2017-02-28 NOTE — Telephone Encounter (Signed)
Patient was called to reschedule her pre-visit and colonoscopy per Dr. Russella DarStark. Patients BMI is 52.1 and [facility]. The physician schedule is not out until until the middle of July so we need to inform the patient that we need to reschedule her appointment. Ok per Dr. Russella DarStark to be a direct [facility].    Sheila DaneNancy Glyn Zendejas, LPN

## 2017-03-01 NOTE — Telephone Encounter (Signed)
I spoke with Sheila Ferguson regarding her pre-visit appointment and her colonoscopy. Patient will call the second week in July to [facility]  and pre-visit. The physicians hospital schedule is not out for August or Sept. Patient's BMI is 52.1. Ok per Dr. Russella DarStark to direct schedule at the hospital.    Janalee DaneNancy Elray Dains, LPN

## 2017-03-01 NOTE — Telephone Encounter (Signed)
Patient returning phone call to Harriett Sineancy best call back # is [phone]

## 2017-03-02 NOTE — Addendum Note (Signed)
Addended by: Neomia DearPOWERS, Tranise Forrest E on: 03/02/2017 11:36 AM   Modules accepted: Orders

## 2017-03-22 ENCOUNTER — Encounter: Payer: BLUE CROSS/BLUE SHIELD | Admitting: Gastroenterology

## 2017-03-23 NOTE — Telephone Encounter (Signed)
Left message for patient to return my call.

## 2017-03-23 NOTE — Telephone Encounter (Signed)
Sheri,  Can you help me with this?  Thank you  Judeth CornfieldStephanie

## 2017-03-23 NOTE — Telephone Encounter (Signed)
Patient has been scheduled for 05/17/17 11:[facility].  She will need to arrive at 9:30 and will need a pre-visit prior.  Would you mind helping arrange the pre-visit and just put the details in the appt notes.

## 2017-03-24 NOTE — Telephone Encounter (Signed)
Left message for patient to return my call.

## 2017-03-29 NOTE — Telephone Encounter (Signed)
Left message for patient to return my call.

## 2017-04-01 ENCOUNTER — Encounter: Payer: Self-pay | Admitting: Internal Medicine

## 2017-04-01 ENCOUNTER — Ambulatory Visit (INDEPENDENT_AMBULATORY_CARE_PROVIDER_SITE_OTHER): Payer: BLUE CROSS/BLUE SHIELD | Admitting: Internal Medicine

## 2017-04-01 VITALS — BP 156/85 | HR 78 | Temp 98.3°F | Ht 64.0 in | Wt 299.6 lb

## 2017-04-01 DIAGNOSIS — E11319 Type 2 diabetes mellitus with unspecified diabetic retinopathy without macular edema: Secondary | ICD-10-CM | POA: Diagnosis not present

## 2017-04-01 DIAGNOSIS — I10 Essential (primary) hypertension: Secondary | ICD-10-CM | POA: Diagnosis not present

## 2017-04-01 DIAGNOSIS — Z0184 Encounter for antibody response examination: Secondary | ICD-10-CM | POA: Insufficient documentation

## 2017-04-01 DIAGNOSIS — Z794 Long term (current) use of insulin: Secondary | ICD-10-CM

## 2017-04-01 DIAGNOSIS — Z79899 Other long term (current) drug therapy: Secondary | ICD-10-CM | POA: Diagnosis not present

## 2017-04-01 DIAGNOSIS — E669 Obesity, unspecified: Secondary | ICD-10-CM

## 2017-04-01 DIAGNOSIS — E113599 Type 2 diabetes mellitus with proliferative diabetic retinopathy without macular edema, unspecified eye: Secondary | ICD-10-CM

## 2017-04-01 MED ORDER — METOPROLOL TARTRATE 100 MG PO TABS: 100.0000 mg | ORAL_TABLET | Freq: Two times a day (BID) | ORAL | 1 refills | Status: DC

## 2017-04-01 MED ORDER — LISINOPRIL 40 MG PO TABS: 40.0000 mg | ORAL_TABLET | Freq: Every day | ORAL | 3 refills | Status: DC

## 2017-04-01 MED ORDER — GLIPIZIDE 10 MG PO TABS: 10.0000 mg | ORAL_TABLET | Freq: Two times a day (BID) | ORAL | 3 refills | Status: DC

## 2017-04-01 MED ORDER — CITALOPRAM HYDROBROMIDE 40 MG PO TABS: 40.0000 mg | ORAL_TABLET | Freq: Every day | ORAL | 1 refills | Status: DC

## 2017-04-01 NOTE — Assessment & Plan Note (Signed)
She has stopped taking her emagliflozin due to a yeast infection after her last visit. Her weight is relatively stable, she does report giving up soda. She has not been able to make an appointment for continuous glucose monitoring, encouraged pt to re-attempt to make that appt. Will recheck A1c and defer her DM regimen on f/u with more information regarding her blood sugar levels.

## 2017-04-01 NOTE — Progress Notes (Signed)
   CC: Vaccinations or immunity status  HPI:  Sheila Ferguson is a 56 y.o. F with past medical history as described below who presents to the clinic for need for vaccinations.   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 As part of the new hire process, immunization records are needed. She does not have any immunization records from her childhood, but feels confident she received all vaccinations on a normal schedule, she had chicken pox as a child. She had a TB skin test recently which was negative, and has had the tetanus shot here in 2013.   She states she is otherwise doing well, has stopped drinking soda. She needs refills on some of her chronic medications.   Past Medical History:  Diagnosis Date  . Anal fissure   . Anxiety   . Breast cancer (HCC)   . Congenital stricture of esophagus   . Depression   . Diabetes mellitus type II   . DIABETES MELLITUS, TYPE II, UNCONTROLLED 09/01/2007   04/10/13 eye exam neg proliferative DM retinopathy, +non proliferative DM retinopathy OS and OD +amblyopia OD   . Dysmetabolic syndrome   . Gall bladder disease    removed  . GERD (gastroesophageal reflux disease)   . Headache(784.0)   . Hypertension   . Sarcoid   . Sleep apnea    Review of Systems:  Review of Systems  Constitutional: Negative for fever and weight loss.  Cardiovascular: Positive for claudication.  Musculoskeletal: Positive for joint pain (chronic knee pain).     Physical Exam:  Vitals:   04/01/17 0943  BP: (!) 156/85  Pulse: 78  Temp: 98.3 F (36.8 C)  TempSrc: Oral  SpO2: 100%  Weight: 299 lb 9.6 oz (135.9 kg)  Height: 5\' 4"  (1.626 m)   Physical Exam  Constitutional: She is oriented to person, place, and time.  Obese female, sitting comfortably in no acute distress   Neurological: She is alert and oriented to person, place, and time.  Skin: Skin is warm and dry.  Psychiatric: She has a normal mood and affect. Her  behavior is normal.    Assessment & Plan:   See Encounters Tab for problem based charting.  Patient seen with Dr. Rogelia BogaButcher

## 2017-04-01 NOTE — Patient Instructions (Addendum)
Nice to meet you Sheila Ferguson.   We are checking your blood work for your immune status to make sure you've had all your vaccines and we can try to send those results to your new office. You can call the clinic in a few days when they've had time to come back to make sure we can send them.   For your blood pressure, try taking a half tablet of your Amlodipine. Hopefully this won't cause muscle cramps and will also have more time to work if you're able to take it every day. We're checking some electrolytes today as well to make sure everything looks good.   I'll see you back hopefully some time in August to see how you're doing with your blood pressure and diabetes

## 2017-04-01 NOTE — Assessment & Plan Note (Signed)
She requires titers and proof of immunity in lieu of vaccination records for her new employer. Her tetanus is up to date, TB results already sent to employer.   -Hep B Surface Ab -MMR Immunity panel -Varicella IgG/IgM

## 2017-04-01 NOTE — Assessment & Plan Note (Signed)
Her BP is elevated today to 156/85, also elevated on recent clinic visits. She reports she is not regularly taking amlodipine due to muscle cramps- she takes around 2-3 days, and skips for 2 days. This may explain the persistently elevated BP after regimen adjustments at previous visits. She has also had muscle cramps with HCTZ in the past. We will decrease the dose of Amlodipine today in hopes of avoiding cramping sx to increase adherence and better evaluate her BP control on her next visit. If control remains difficult, may consider spironolactone.   -Decrease Amlodipine to 5 mg daily  -Refilled Lisinopril 40 mg daily -Refilled Metoprolol 100 mg BID

## 2017-04-04 LAB — BMP8+ANION GAP
Anion Gap: 18 mmol/L (ref 10.0–18.0)
BUN / CREAT RATIO: 11 (ref 9–23)
BUN: 11 mg/dL (ref 6–24)
CHLORIDE: 100 mmol/L (ref 96–106)
CO2: 23 mmol/L (ref 20–29)
Calcium: 9.3 mg/dL (ref 8.7–10.2)
Creatinine, Ser: 1.04 mg/dL — ABNORMAL HIGH (ref 0.57–1.00)
GFR calc Af Amer: 70 mL/min/{1.73_m2} (ref >59)
GFR calc non Af Amer: 61 mL/min/{1.73_m2} (ref >59)
GLUCOSE: 191 mg/dL — AB (ref 65–99)
POTASSIUM: 4.3 mmol/L (ref 3.5–5.2)
Sodium: 141 mmol/L (ref 134–144)

## 2017-04-04 LAB — MEASLES/MUMPS/RUBELLA IMMUNITY
MUMPS ABS, IGG: 152 [AU]/ml (ref >10.9)
Rubella Antibodies, IGG: 2.54 index (ref >0.99)

## 2017-04-04 LAB — VARICELLA ZOSTER ABS, IGG/IGM
VARICELLA: 2757 {index} (ref >165)
Varicella IgM: 3.7 index — ABNORMAL HIGH (ref 0.00–0.90)

## 2017-04-04 LAB — HEPATITIS B SURFACE ANTIBODY,QUALITATIVE: HEP B SURFACE AB, QUAL: NONREACTIVE

## 2017-04-04 NOTE — Progress Notes (Signed)
Internal Medicine Clinic Attending  I saw and evaluated the patient.  I personally confirmed the key portions of the history and exam documented by Dr. Harden and I reviewed pertinent patient test results.  The assessment, diagnosis, and plan were formulated together and I agree with the documentation in the resident's note.  

## 2017-04-12 ENCOUNTER — Telehealth: Payer: Self-pay | Admitting: Gastroenterology

## 2017-04-12 NOTE — Addendum Note (Signed)
Addended by: Neomia DearPOWERS, Alena Blankenbeckler E on: 04/12/2017 01:13 PM   Modules accepted: Orders

## 2017-04-12 NOTE — Telephone Encounter (Signed)
Patient notified that the colonoscopy has been cancelled and she is asked to call back when she is ready to schedule.

## 2017-04-12 NOTE — Telephone Encounter (Signed)
Patient cancelled procedure.

## 2017-04-13 ENCOUNTER — Telehealth: Payer: Self-pay

## 2017-04-13 NOTE — Telephone Encounter (Signed)
Requesting lab result. Please call back. 

## 2017-04-14 NOTE — Telephone Encounter (Signed)
Please call pt and results need to be faxed, please call her at ph# [phone] asap

## 2017-04-15 NOTE — Telephone Encounter (Addendum)
Done

## 2017-05-03 ENCOUNTER — Telehealth: Payer: Self-pay

## 2017-05-03 NOTE — Telephone Encounter (Signed)
Requesting to speak with a nurse about injection for job. Please call pt back.

## 2017-05-03 NOTE — Telephone Encounter (Signed)
Called pt, she had tdap in 2013, will fax record to [phone]

## 2017-05-05 ENCOUNTER — Telehealth: Payer: Self-pay | Admitting: Internal Medicine

## 2017-05-05 MED ORDER — INSULIN NPH (HUMAN) (ISOPHANE) 100 UNIT/ML ~~LOC~~ SUSP: 33.0000 [IU] | Freq: Two times a day (BID) | SUBCUTANEOUS | 5 refills | Status: DC

## 2017-05-05 NOTE — Telephone Encounter (Signed)
Refill Request        NOVOLIN N RELION 100 UNIT/ML injection

## 2017-05-06 ENCOUNTER — Ambulatory Visit: Payer: BLUE CROSS/BLUE SHIELD

## 2017-05-06 NOTE — Telephone Encounter (Signed)
Patient has an appointment scheduled with Dr. Anthonette Legato on 05/24/17 at 1:15 pm.

## 2017-05-17 ENCOUNTER — Encounter (HOSPITAL_COMMUNITY): Payer: Self-pay

## 2017-05-17 ENCOUNTER — Ambulatory Visit (HOSPITAL_COMMUNITY): Admit: 2017-05-17 | Payer: BLUE CROSS/BLUE SHIELD | Admitting: Gastroenterology

## 2017-05-17 SURGERY — COLONOSCOPY WITH PROPOFOL
Anesthesia: Monitor Anesthesia Care

## 2017-05-23 NOTE — Progress Notes (Signed)
   CC: F/u of diabetes, hypertension  HPI:  Sheila Ferguson is a 56 y.o. F with past medical history as described below who presents to the clinic for follow up of her chronic medical conditions.   DM: The pt notes that she could be eating better. She has tried eating more salad, brown rice, and vegetables but states she gets bored with the taste. Exercise is also difficult due to arthritis pain in her knees. She has also experienced episodes of low blood sugar when she feels shaky, nauseous. She has checked her blood sugar at these times with values in the 60s, 70s. Sx resolve with intake of juice or soda. She states these occur 3-4 x per month. The lower values make her nervous about her using her insulin.   HTN: She reports she is no longer experiencing the cramps she associated with the Amlodipine after the dose was decreased at the last visit. Taking all her medications as prescribed.   Obesity: She had previously begun evaluation for bariatric surgery with sleeve gastrectomy. She has had trouble filling out some paperwork because she is not confident in the details to some of the answers.     Past Medical History:  Diagnosis Date  . Anal fissure   . Anxiety   . Breast cancer (HCC)   . Congenital stricture of esophagus   . Depression   . Diabetes mellitus type II   . DIABETES MELLITUS, TYPE II, UNCONTROLLED 09/01/2007   04/10/13 eye exam neg proliferative DM retinopathy, +non proliferative DM retinopathy OS and OD +amblyopia OD   . Dysmetabolic syndrome   . Gall bladder disease    removed  . GERD (gastroesophageal reflux disease)   . Headache(784.0)   . Hypertension   . Sarcoid   . Sleep apnea    Review of Systems:  Review of Systems  Constitutional: Negative for fever and weight loss.  Gastrointestinal: Negative for abdominal pain.  Musculoskeletal: Negative for falls.  Neurological: Negative for seizures.  Psychiatric/Behavioral: Negative for depression.     Physical Exam:  Vitals:   05/24/17 1333  BP: 127/63  Pulse: 82  Temp: 98.5 F (36.9 C)  TempSrc: Oral  SpO2: 98%  Weight: (!) 301 lb 11.2 oz (136.9 kg)  Height: 5\' 4"  (1.626 m)   Physical Exam  Constitutional: She is oriented to person, place, and time. She appears well-developed and well-nourished.  Cardiovascular: Normal rate, regular rhythm and normal heart sounds.   Pulmonary/Chest: Effort normal and breath sounds normal. No respiratory distress.  Abdominal: Soft. She exhibits no distension. There is no tenderness.  Obese abdomen  Neurological: She is alert and oriented to person, place, and time.  Skin: Skin is warm and dry.    Assessment & Plan:   See Encounters Tab for problem based charting.  Patient seen with Dr. Josem KaufmannKlima

## 2017-05-24 ENCOUNTER — Encounter: Payer: Self-pay | Admitting: Internal Medicine

## 2017-05-24 ENCOUNTER — Ambulatory Visit (INDEPENDENT_AMBULATORY_CARE_PROVIDER_SITE_OTHER): Payer: BLUE CROSS/BLUE SHIELD | Admitting: Internal Medicine

## 2017-05-24 ENCOUNTER — Ambulatory Visit (INDEPENDENT_AMBULATORY_CARE_PROVIDER_SITE_OTHER): Payer: BLUE CROSS/BLUE SHIELD | Admitting: Dietician

## 2017-05-24 VITALS — BP 127/63 | HR 82 | Temp 98.5°F | Ht 64.0 in | Wt 301.7 lb

## 2017-05-24 DIAGNOSIS — I10 Essential (primary) hypertension: Secondary | ICD-10-CM

## 2017-05-24 DIAGNOSIS — E11319 Type 2 diabetes mellitus with unspecified diabetic retinopathy without macular edema: Secondary | ICD-10-CM | POA: Diagnosis not present

## 2017-05-24 DIAGNOSIS — Z713 Dietary counseling and surveillance: Secondary | ICD-10-CM | POA: Diagnosis not present

## 2017-05-24 DIAGNOSIS — Z79899 Other long term (current) drug therapy: Secondary | ICD-10-CM

## 2017-05-24 DIAGNOSIS — Z794 Long term (current) use of insulin: Secondary | ICD-10-CM

## 2017-05-24 DIAGNOSIS — M170 Bilateral primary osteoarthritis of knee: Secondary | ICD-10-CM | POA: Diagnosis not present

## 2017-05-24 DIAGNOSIS — Z23 Encounter for immunization: Secondary | ICD-10-CM | POA: Diagnosis not present

## 2017-05-24 DIAGNOSIS — Z6841 Body Mass Index (BMI) 40.0 and over, adult: Secondary | ICD-10-CM

## 2017-05-24 DIAGNOSIS — E113599 Type 2 diabetes mellitus with proliferative diabetic retinopathy without macular edema, unspecified eye: Secondary | ICD-10-CM

## 2017-05-24 LAB — POCT GLYCOSYLATED HEMOGLOBIN (HGB A1C): Hemoglobin A1C: 8.7

## 2017-05-24 LAB — GLUCOSE, CAPILLARY: Glucose-Capillary: 299 mg/dL — ABNORMAL HIGH (ref 65–99)

## 2017-05-24 MED ORDER — GLIPIZIDE 10 MG PO TABS: 10.0000 mg | ORAL_TABLET | Freq: Every day | ORAL | 3 refills | Status: DC

## 2017-05-24 MED ORDER — INSULIN NPH (HUMAN) (ISOPHANE) 100 UNIT/ML ~~LOC~~ SUSP: 33.0000 [IU] | Freq: Two times a day (BID) | SUBCUTANEOUS | 5 refills | Status: DC

## 2017-05-24 NOTE — Assessment & Plan Note (Addendum)
Her last A1c in May '18 was 9.7, stable from the previous value three months prior. [facility] emagliflozin was started, however she discontinued this medication after a yeast infection. She is currently on NPH insulin (33 U am, 30 U pm), glipizide 10 mg BID, and metformin XR 2000 mg. She has also tried to make small lifestyle modifications though reports difficulty. Today her A1c has decreased to 8.7, however, she reports having several hypoglycemic events, supported by her meter readings. These episodes occur at various times, no consistent pattern to time of day. Her hypoglycemia is likely related to her concurrent use of both insulin and sulfonylurea. She would benefit from moving toward a regimen without the sulfonylurea which would be easier to titrate with a known amount of exogenous insulin only, and less likely to cause hypoglycema. Will initiate this transition today with a modest increase in her NPH insulin and decreasing her sulfonylurea dose. She will undergo CGM which will provide accurate information to make further adjustments with tentative plan to further increase NPH while stopping glipizide.   --Increase NPH Insulin to 36 U am, 33 U pm --Decrease Glipizide to once daily with largest meal of breakfast/lunch --Continue Metformin XR  --F/u in 1 week for CGM report

## 2017-05-24 NOTE — Patient Instructions (Addendum)
Please record the time, amount and what food drinks and activities you have while wearing the continuous glucose monitor(CGM).  Bring the folder with you to follow up appointments  Do not have a CT or an MRI while wearing the CGM.   Please make an appointment for 1 week with me and a doctor for the first of two CGM downloads..   You will also return in 2 weeks to have your second download and the CGM remove  

## 2017-05-24 NOTE — Assessment & Plan Note (Signed)
Received annual flu vaccine. 

## 2017-05-24 NOTE — Assessment & Plan Note (Addendum)
Currently on amlodipine 5 mg daily, lisinopril 40 mg daily, and metoprolol 100 mg twice a day. She had poor control of her blood pressure, but was unable to tolerate 10 mg of amlodipine which was affecting regimen adherence. Since the last visit the amlodipine dose was decreased which decreased her experienced side effects and she has been able to take it regularly. With adherence, her BP today is 127/63, much improved compared to prior.   --Continue current regimen

## 2017-05-24 NOTE — Patient Instructions (Addendum)
Good to see you again today Sheila Ferguson.  For your diabetes medications we are making the following changes:  -Take 36 units of Novolin in the morning and 33 units in the evening. -Only take glipizide once a day with breakfast or lunch. This type of medicine can cause low blood sugar and we will eventually transitioned to not taking it at all. -Continue taking your metformin as usual. -Sheila Ferguson will set up your glucose monitoring and we will be able to see how these changes are doing. I will be able to review these results after the 2 weeks. Hopefully they will decrease your low blood sugars and keep you at a good level more often.  Great job with your blood pressure! We don't need to make any changes to those medicines  Try to complete the paperwork for the bariatric surgery workup. And let us know if you have any questions or need help.

## 2017-05-24 NOTE — Progress Notes (Signed)
Freestyle Libre Pro CGM sensor placed and started. Patient was educated about wearing sensor, keeping food, activity and medication log and when to call office. Follow up was arranged with the patient.  Plyler, Lupita LeashDonna, RD 05/24/2017 3:56 PM.

## 2017-05-24 NOTE — Assessment & Plan Note (Signed)
She has initiated the early stages of evaluation for bariatric surgery. This would likely be a great benefit to the patient and help to decrease her risk of long-term complications related to her weight, DM, and HTN. She was encouraged to complete the paperwork to the best of her ability, call the clinic with specific questions. She was provided with a written record of her recent weights at clinic visits to help complete an aspect of the paperwork she remembered.

## 2017-05-25 NOTE — Progress Notes (Signed)
Patient ID: Sheila Ferguson, female   DOB: [birth year], 56 y.o.   MRN: [id number]  I saw and evaluated the patient.  I personally confirmed the key portions of Dr. Cammy BrochureHarden's history and exam and reviewed pertinent patient test results.  The assessment, diagnosis, and plan were formulated together and I agree with the documentation in the resident's note.

## 2017-05-25 NOTE — Addendum Note (Signed)
Addended by: Doneen PoissonKLIMA, Will Schier D on: 05/25/2017 09:19 AM   Modules accepted: Level of Service

## 2017-05-31 ENCOUNTER — Telehealth: Payer: Self-pay | Admitting: Dietician

## 2017-05-31 ENCOUNTER — Other Ambulatory Visit: Payer: Self-pay | Admitting: Internal Medicine

## 2017-05-31 ENCOUNTER — Encounter: Payer: Self-pay | Admitting: Internal Medicine

## 2017-05-31 ENCOUNTER — Ambulatory Visit (INDEPENDENT_AMBULATORY_CARE_PROVIDER_SITE_OTHER): Payer: BLUE CROSS/BLUE SHIELD | Admitting: Internal Medicine

## 2017-05-31 ENCOUNTER — Ambulatory Visit (INDEPENDENT_AMBULATORY_CARE_PROVIDER_SITE_OTHER): Payer: BLUE CROSS/BLUE SHIELD | Admitting: Dietician

## 2017-05-31 VITALS — BP 145/72 | HR 85 | Temp 98.5°F | Ht 64.0 in | Wt 301.9 lb

## 2017-05-31 DIAGNOSIS — E113299 Type 2 diabetes mellitus with mild nonproliferative diabetic retinopathy without macular edema, unspecified eye: Secondary | ICD-10-CM | POA: Diagnosis not present

## 2017-05-31 DIAGNOSIS — Z833 Family history of diabetes mellitus: Secondary | ICD-10-CM | POA: Diagnosis not present

## 2017-05-31 DIAGNOSIS — E119 Type 2 diabetes mellitus without complications: Secondary | ICD-10-CM

## 2017-05-31 DIAGNOSIS — F419 Anxiety disorder, unspecified: Secondary | ICD-10-CM

## 2017-05-31 DIAGNOSIS — Z806 Family history of leukemia: Secondary | ICD-10-CM | POA: Diagnosis not present

## 2017-05-31 DIAGNOSIS — Z713 Dietary counseling and surveillance: Secondary | ICD-10-CM | POA: Diagnosis not present

## 2017-05-31 DIAGNOSIS — Z840 Family history of diseases of the skin and subcutaneous tissue: Secondary | ICD-10-CM

## 2017-05-31 DIAGNOSIS — E1165 Type 2 diabetes mellitus with hyperglycemia: Secondary | ICD-10-CM

## 2017-05-31 DIAGNOSIS — Z8249 Family history of ischemic heart disease and other diseases of the circulatory system: Secondary | ICD-10-CM

## 2017-05-31 DIAGNOSIS — Z836 Family history of other diseases of the respiratory system: Secondary | ICD-10-CM | POA: Diagnosis not present

## 2017-05-31 DIAGNOSIS — Z794 Long term (current) use of insulin: Secondary | ICD-10-CM | POA: Diagnosis not present

## 2017-05-31 DIAGNOSIS — Z79899 Other long term (current) drug therapy: Secondary | ICD-10-CM | POA: Diagnosis not present

## 2017-05-31 DIAGNOSIS — F32A Depression, unspecified: Secondary | ICD-10-CM

## 2017-05-31 DIAGNOSIS — F329 Major depressive disorder, single episode, unspecified: Secondary | ICD-10-CM

## 2017-05-31 DIAGNOSIS — E113599 Type 2 diabetes mellitus with proliferative diabetic retinopathy without macular edema, unspecified eye: Secondary | ICD-10-CM

## 2017-05-31 DIAGNOSIS — F418 Other specified anxiety disorders: Secondary | ICD-10-CM | POA: Diagnosis not present

## 2017-05-31 DIAGNOSIS — I10 Essential (primary) hypertension: Secondary | ICD-10-CM

## 2017-05-31 MED ORDER — INSULIN GLARGINE 100 UNIT/ML ~~LOC~~ SOLN
35.0000 [IU] | Freq: Every day | SUBCUTANEOUS | 0 refills | Status: DC
Start: 2017-05-31 — End: 2017-06-07

## 2017-05-31 MED ORDER — LIRAGLUTIDE 18 MG/3ML ~~LOC~~ SOPN: 0.6000 mg | PEN_INJECTOR | Freq: Every day | SUBCUTANEOUS | 2 refills | Status: DC

## 2017-05-31 NOTE — Progress Notes (Signed)
  Medical Nutrition Therapy:  Appt start time: 08:49 end time:  0930. Visit # 1  Assessment:  Primary concerns today: weight loss, glycemic control.  Sheila Ferguson is here for her first CGM download. She wore the CGM for 7 days with no problems. "I had no lows." Her average blood sugar was 173, 59% in target, 40% above target and 1% below target( <70) for 135 minutes . She was not symptomatic of the low. When asked about her goals she says she wants to lose weight. She liked victoza and was taken off when she lost her insurance.  She currently has insurance paying her medicine at 100% and is willing to eat lower fat, higher fiber food that she stopped when she started having low blood sugars. She has not finished the paperwork for bariatric surgery.  Preferred Learning Style: No preference indicated  Learning Readiness: Ready  ANTHROPOMETRICS: weight-301.9#, BMI-51 WEIGHT HISTORY: Highest: 317# in 2015 Lowest-256 in 2008 SLEEP:good most nights  MEDICATIONS: 36 units NPH QAM ~ 7:45 AM, 33 units at bedtime ~ 11 PM BLOOD SUGAR:last A1C 8.7%, last 7 days as above, did not bring meter DIETARY INTAKE: Usual eating pattern includes 2-3 meals and 1-2 snacks per day.Food Intolerances: none discussed Dining Out (times/week): frequently eats food from fast food and restaurants. Everyday foods include fries, chicken sandwich, salad.  Avoided foods include none discussed today.   24-hr recall:  B ( 8AM): coffee  Snk ( 11AM): chips or candy  L ( 12-1PM): sandwich, fries, sweet drink Snk ( PM):  D (9-10 PM): 2 bowls cereal or salad or sandwich, fruit and water or rice, baked chicken, milk, green beans, brownie Beverages: sweet tea, regular soda, juice and water  Usual physical activity:ADLs  Estimated daily energy needs: ~ 2500 calories   Progress Towards Goal(s):  In progress.   Nutritional Diagnosis:  [place]-2.3 Food-medication interaction As related to hig and lowo blood sugars due to not mathcing insulin  to food intake.  As evidenced by her CGM download.    Intervention:  Nutrition education about need to coordinate food with timing of insulin. Coordination of care: suggest restarting Victoza and decreasing her insulin by at least 20%. Discussed with Drs. Hoffman and Winfrey. Consider checking c-peptide and insulin fasting to assess endogenous insulin production. Attempt to wean her off as many hypoglycemic causing medicines as possible.  Teaching Method Utilized: Visual, Auditory, Hands on Handouts given during visit include: CGM download Barriers to learning/adherence to lifestyle change: material resources, lack of diabetes training Demonstrated degree of understanding via:  Teach Back   Monitoring/Evaluation:  Dietary intake, exercise, meter & CGM sensor, and body weight in 1 week(s)Sheila Ferguson, Sheila Ferguson, RD 05/31/2017 11:50 AM.

## 2017-05-31 NOTE — Progress Notes (Signed)
CC: hyperglycemia,   HPI:  [name] is a 56 y.o. female who comes in to evaluate her type 2 diabetes mellitus. On her last visit 1 week ago she received diabetic and nutritional counseling along with a continuous blood glucose monitor. She has been struggling with her glycemic control for many years now and has sought out help with this issue. She has also had trouble with her health insurance situation but now has good coverage and wants to do the best thing possible for her diabetes.  Please see A&P for status of the patient's chronic medical conditions  Past Medical History:  Diagnosis Date  . Anal fissure   . Anxiety   . Breast cancer (HCC)   . Congenital stricture of esophagus   . Depression   . Diabetes mellitus type II   . DIABETES MELLITUS, TYPE II, UNCONTROLLED 09/01/2007   04/10/13 eye exam neg proliferative DM retinopathy, +non proliferative DM retinopathy OS and OD +amblyopia OD   . Dysmetabolic syndrome   . Gall bladder disease    removed  . GERD (gastroesophageal reflux disease)   . Headache(784.0)   . Hypertension   . Sarcoid   . Sleep apnea    Review of Systems: ROS: Pulmonary: pt denies increased work of breathing, shortness of breath,  Cardiac: pt denies palpitations, chest pain,  Abdominal: pt denies abdominal pain, nausea, vomiting, or diarrhea  Physical Exam:  Vitals:   05/31/17 1000  BP: (!) 145/72  Pulse: 85  Temp: 98.5 F (36.9 C)  TempSrc: Oral  SpO2: 99%  Weight: (!) 301 lb 14.4 oz (136.9 kg)  Height:  (1.626 m)   Physical Exam  Constitutional: She is oriented to person, place, and time. She appears well-developed and well-nourished.  HENT:  Head: Normocephalic and atraumatic.  Eyes: Right eye exhibits no discharge. Left eye exhibits no discharge. No scleral icterus.  Neck: No JVD present. No tracheal deviation present. No thyromegaly present.  Cardiovascular: Normal rate, regular rhythm and normal heart sounds.     Pulmonary/Chest: Effort normal and breath sounds normal. No respiratory distress. She has no wheezes.  Abdominal: Soft. Bowel sounds are normal. She exhibits no distension. There is no tenderness. There is no guarding.  Musculoskeletal: She exhibits no edema or deformity.  Neurological: She is alert and oriented to person, place, and time.  Psychiatric: She has a normal mood and affect. Her behavior is normal.    Social History   Social History  . Marital status: Single    Spouse name: N/A  . Number of children: N/A  . Years of education: 13+   Occupational History  . 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 1 year left   Social History Main Topics  . Smoking status: Never Smoker  . Smokeless tobacco: Never Used  . Alcohol use No  . Drug use: No  . Sexual activity: Not on file   Other Topics Concern  . Not on file   Social History Narrative  . No narrative on file    Family History  Problem Relation Age of Onset  . Sarcoidosis Father   . Leukemia Father   . Hypertension Mother   . Diabetes Mother   . Heart attack Mother   . Aneurysm Mother        CNS  . Hypertension Sister   . Diabetes Sister   . Sleep apnea Sister     Assessment & Plan:   See Encounters Tab  for problem based charting.  Patient seen with Dr. Angelia Mould

## 2017-05-31 NOTE — Telephone Encounter (Signed)
Please send to pts pcp as we did not discuss this issue today.

## 2017-05-31 NOTE — Patient Instructions (Signed)
Rosezella Rumpf job keeping records. That was very helpful to be able to accurately review your continuous Glucose Monitor information.   Most of your blood sugars are in target. If they stay like this for 3 months your A1C will be about 7.5%.   Please make a follow up in 1 week with me so we can see how the changes you make this coming week do.  Lupita Leash [phone]

## 2017-05-31 NOTE — Patient Instructions (Signed)
We have discontinued your glipizide  and we have discontinued your NPH insulin.  Continue taking your metformin  xr and we have added victoza 0.6mg  per day and lantus 35 units every day.  As discussed you will take the victoza and lantus in the mornings and eat a healthy diet throughout the day without worry of having low sugars.  If you are having lows please let us know.  Also if your sugars are running very high please let us know.  We will have you back in one week to adjust as necessary.

## 2017-05-31 NOTE — Assessment & Plan Note (Signed)
Patient has had type 2 diabetes for about 18 years now. She was given counseling one week ago along with a continuous blood glucose monitor. Her insulin regimen was changed to 36 units of NPH in the morning and 33 units of NPH at night. She feels she has been on too much insulin in the past and has had lows, because of this she tends to eat more unhealthy food and drink unhealthy drinks to make sure she does not get a low blood sugar.  She admits that sometimes she also gets busy and has to eat fast food and that many times she may eat only one to 2 meals per day as opposed to 3.  In reviewing her CBG data from the past week and speaking with the patient we felt it would be more appropriate to switch to a one to one basal bolus regimen with Lantus and NovoLog. However when presenting to this idea to the patient she was reluctant to begin the mealtime insulin due to too many changes happening at once. Therefore we decided to begin with just the long-acting Lantus starting at 35 units. We also discussed discontinuing her glipizide due to her low blood sugars in the past and she was very amenable to this idea. When she had insurance in the past she was on Victoza and had good results from this medication and she is additionally interested in losing weight. After discussing with the patient we felt this would be a good idea to reinstitute the victoza.  She agreed that we can reassess after 1 week at a time when she will feel better about starting mealtime insulin as well if necessary.  Given her prior glucose readings and the CBM report I prepared her for the fact that we will most likely have to add some mealtime insulin.  She agreed that it was also a good idea to continue her metformin 2000 mg extended release.  Patient understands that she can eat as healthy as she wants to and will try to do so due to the fact that her long-acting insulin has now been virtually cut in half.    -begin Lantus 35 units every morning  and Victoza 0.6 mg every morning( to be titrated up) -continue metformin 2000 mg per day extended release -evaluate continuous blood glucose monitoring device again after 1 week and adjust regimen as necessary.

## 2017-05-31 NOTE — Telephone Encounter (Signed)
Patient informed. 

## 2017-05-31 NOTE — Telephone Encounter (Signed)
Patient was seen today by Dr Frances Furbish in [facility] states they spoke about refilling her gabapentin (NEURONTIN) 300 MG capsule at  [facility] [address] [place] - [address] [phone] (Phone) [phone] (Fax)    Did not see it on her AVS.  Please verify and call patient.

## 2017-05-31 NOTE — Assessment & Plan Note (Addendum)
Patient reported no issues with her anxiety and depression recently.  She has been in good spirits and enjoying going to work every day.  She is thankful that I asked and feels better about not only addressing her diabetes but treating her as a whole person.  She is not currently on any therapy but was on Celexa in the past.  -continue to screen for depression and anxiety in subsequent visits

## 2017-05-31 NOTE — Assessment & Plan Note (Addendum)
Patient has had some difficulty in the past controlling her blood pressure. It was well within the normal range during her last visit one week ago at 127/63.  She is currently on metoprolol 100 mg twice a day, lisinopril 40 mg once daily, amlodipine 5 mg daily.  Her systolic was slightly higher today at 145/72.  We agreed to continue to monitor this as she will be returning in only one week.  -Continue hypertension management as written above.

## 2017-05-31 NOTE — Telephone Encounter (Signed)
Patient requested assistance in finding out her insurance coverage for the personal Freestyle CGM. Called [employer] on her behalf: She has 100% coverage.  She also has coverage for the professional CGM once every 3 months.

## 2017-06-01 MED ORDER — GABAPENTIN 300 MG PO CAPS: 300.0000 mg | ORAL_CAPSULE | Freq: Three times a day (TID) | ORAL | 1 refills | Status: DC

## 2017-06-01 NOTE — Progress Notes (Signed)
Internal Medicine Clinic Attending  I saw and evaluated the patient.  I personally confirmed the key portions of the history and exam documented by Dr. Winfrey and I reviewed pertinent patient test results.  The assessment, diagnosis, and plan were formulated together and I agree with the documentation in the resident's note. 

## 2017-06-06 ENCOUNTER — Other Ambulatory Visit: Payer: Self-pay | Admitting: Dietician

## 2017-06-06 DIAGNOSIS — Z794 Long term (current) use of insulin: Principal | ICD-10-CM

## 2017-06-06 DIAGNOSIS — E113599 Type 2 diabetes mellitus with proliferative diabetic retinopathy without macular edema, unspecified eye: Secondary | ICD-10-CM

## 2017-06-06 MED ORDER — FREESTYLE LIBRE READER DEVI: 1.0000 | Freq: Four times a day (QID) | 0 refills | Status: DC

## 2017-06-06 MED ORDER — FREESTYLE LIBRE SENSOR SYSTEM MISC: 12 refills | Status: DC

## 2017-06-06 NOTE — Telephone Encounter (Signed)
Ms. Allyne Gee has enjoyed wearing the [facility] and would like to own her own [facility] personal. She requests a prescription.

## 2017-06-06 NOTE — Telephone Encounter (Signed)
completed

## 2017-06-07 ENCOUNTER — Encounter: Payer: BLUE CROSS/BLUE SHIELD | Admitting: Dietician

## 2017-06-07 ENCOUNTER — Ambulatory Visit: Payer: BLUE CROSS/BLUE SHIELD

## 2017-06-07 ENCOUNTER — Ambulatory Visit (INDEPENDENT_AMBULATORY_CARE_PROVIDER_SITE_OTHER): Payer: BLUE CROSS/BLUE SHIELD | Admitting: Internal Medicine

## 2017-06-07 DIAGNOSIS — Z794 Long term (current) use of insulin: Secondary | ICD-10-CM

## 2017-06-07 DIAGNOSIS — E113299 Type 2 diabetes mellitus with mild nonproliferative diabetic retinopathy without macular edema, unspecified eye: Secondary | ICD-10-CM

## 2017-06-07 DIAGNOSIS — E113599 Type 2 diabetes mellitus with proliferative diabetic retinopathy without macular edema, unspecified eye: Secondary | ICD-10-CM

## 2017-06-07 MED ORDER — LIRAGLUTIDE 18 MG/3ML ~~LOC~~ SOPN: 1.2000 mg | PEN_INJECTOR | Freq: Every day | SUBCUTANEOUS | 2 refills | Status: DC

## 2017-06-07 MED ORDER — INSULIN GLARGINE 100 UNIT/ML ~~LOC~~ SOLN: 35.0000 [IU] | Freq: Every day | SUBCUTANEOUS | 0 refills | Status: DC

## 2017-06-07 NOTE — Progress Notes (Signed)
   CC: Type 2 diabetes mellitus  HPI:  [name] is a 57 y.o. with past medical history as documented below presenting for type 2 diabetes follow up. Please see encounter based charting for more detailed description of the patient's chronic medical condition.   Past Medical History:  Diagnosis Date  . Anal fissure   . Anxiety   . Breast cancer (HCC)   . Congenital stricture of esophagus   . Depression   . Diabetes mellitus type II   . DIABETES MELLITUS, TYPE II, UNCONTROLLED 09/01/2007   04/10/13 eye exam neg proliferative DM retinopathy, +non proliferative DM retinopathy OS and OD +amblyopia OD   . Dysmetabolic syndrome   . Gall bladder disease    removed  . GERD (gastroesophageal reflux disease)   . Headache(784.0)   . Hypertension   . Sarcoid   . Sleep apnea    Review of Systems:   Review of Systems  Constitutional: Negative for chills and fever.  Respiratory: Negative for shortness of breath.   Cardiovascular: Negative for chest pain.  Gastrointestinal: Negative for heartburn and nausea.    Physical Exam:  Vitals:   06/07/17 1621 06/07/17 1708  BP: (!) 172/92 (!) 135/98  Pulse: 90 91  Temp: 98.2 F (36.8 C)   TempSrc: Oral   SpO2: 100%   Weight: (!) 303 lb 8 oz (137.7 kg)   Height:  (1.626 m)    General: Laying in bed comfortably, NAD HEENT: [place]/AT, EOMI, no scleral icterus Cardiac: RRR, No R/M/G appreciated Pulm: normal effort, CTAB Abd: soft, non tender, non distended, BS normal Ext: extremities well perfused, no peripheral edema Neuro: alert and oriented X3, cranial nerves II-XII grossly intact   Assessment & Plan:   See Encounters Tab for problem based charting.  Patient seen with Dr. Sandre Kitty

## 2017-06-07 NOTE — Patient Instructions (Addendum)
Sheila Ferguson,   I am happy you are making progress with your diabetes and this new regimen is working for you!  Continue Lantus 35 units daily.   Increase your Victoza to 1.2 mg daily. In one week on October 2, increase the Victoza to 1.8 mg daily.   Continue the Metformin 2000 mg daily (4 tablets) daily.   If you experience more acid reflux with the increase in Victoza do not stop the medicine. Call us and we can suggest changes to your reflux medicine in order to treat this.   Return to clinic in 3 weeks.

## 2017-06-07 NOTE — Assessment & Plan Note (Addendum)
HPI: Patient's insulin and medication regimen was changed one week ago to Lantus 35 units daily, 0.6 mg of Victoza, and 2000 mg of Metformin daily. Her glipizide and Relion were discontinued due to frequent episodes of hypoglycemia. She has been tolerating this new regimen very well and has had no symptomatic episodes of hypoglycemia, which is an improvement from her previous regimens. Patient does admit to sometimes forgetting to take her metformin.  Assessment: Not well controlled, but improving. Most recent A1C 8.7 from 9.7   10/22/2016 10:51 01/11/2017 15:35 05/24/2017 13:51  Hemoglobin A1C 9.7 9.7 8.7   CGM monitoring from 9/11/208-06/07/2017: Sheila Ferguson wore the CGM for 14 days. Average Glucose 178 mg/dl, 16% above 109, 60% in target range of 70-180, 1 hypoglycemic event (pt was asymptomatic), 1% below target range.  Plan: -Titrate Victoza to 1.2 mg daily, and then to 1.8 mg daily the following week. Discussed with the patient that the increase may cause reflux symptoms and to not stop the medicine and call the clinic to discuss medication management of her reflux.  -Continue Lantus 35 units daily -Continue metformin 2000 mg daily -Return to clinic in 3 weeks  -Continue nutritional counseling with Lupita Leash

## 2017-06-08 NOTE — Progress Notes (Signed)
Internal Medicine Clinic Attending  I saw and evaluated the patient.  I personally confirmed the key portions of the history and exam documented by Dr. Minda Meo and I reviewed pertinent patient test results.  The assessment, diagnosis, and plan were formulated together and I agree with the documentation in the resident's note.  She seems pleased with changes to her diabetes regimen and has not had low sugars since these changes. We'll up titrate her liraglutide and continue to follow her closely.   Anne Shutter, MD

## 2017-06-13 ENCOUNTER — Telehealth: Payer: Self-pay | Admitting: Dietician

## 2017-06-13 ENCOUNTER — Encounter: Payer: Self-pay | Admitting: Dietician

## 2017-06-13 DIAGNOSIS — Z794 Long term (current) use of insulin: Principal | ICD-10-CM

## 2017-06-13 DIAGNOSIS — E113599 Type 2 diabetes mellitus with proliferative diabetic retinopathy without macular edema, unspecified eye: Secondary | ICD-10-CM

## 2017-06-13 MED ORDER — GLUCOSE BLOOD VI STRP: ORAL_STRIP | 12 refills | Status: DC

## 2017-06-13 NOTE — Telephone Encounter (Signed)
Sheila Ferguson calls about nausea and no appetite since increasing the Victoza to 1.2 and wants to know if the two medicines are okay to take at the same time(Lantus and Vicotza). I explained these are usual/normal side effects of victoza and may go away. . She has a headache also, but no strips so could not tell me what her blood sugars are. She wants to try to eat smaller portions of food, drink more liquids and see if that helps her feel better. She requests refill on test strips. I suggested she not increase to 1.8mg  until she is feeling better for a few days and call us if she decides that the side effects from the Victoza

## 2017-06-14 ENCOUNTER — Telehealth: Payer: Self-pay | Admitting: *Deleted

## 2017-06-14 NOTE — Telephone Encounter (Signed)
Call [facility] for types of Test Strips covered.  Spoke with representative there who said to continue PA for [employer] Test Strips through CoverMymeds.  Pa was completed and sent to CoverMyMeds..  Awaiting decision within 24 to 48 hours.  Angelina Ok, RN 06/14/2017 .

## 2017-06-24 ENCOUNTER — Other Ambulatory Visit: Payer: Self-pay | Admitting: *Deleted

## 2017-06-24 ENCOUNTER — Telehealth: Payer: Self-pay

## 2017-06-24 DIAGNOSIS — Z794 Long term (current) use of insulin: Principal | ICD-10-CM

## 2017-06-24 DIAGNOSIS — E113599 Type 2 diabetes mellitus with proliferative diabetic retinopathy without macular edema, unspecified eye: Secondary | ICD-10-CM

## 2017-06-24 NOTE — Telephone Encounter (Signed)
Requesting to speak with a nurse about meds. Please call back.  

## 2017-06-24 NOTE — Telephone Encounter (Signed)
Request sent to md

## 2017-06-27 MED ORDER — LIRAGLUTIDE 18 MG/3ML ~~LOC~~ SOPN: 1.8000 mg | PEN_INJECTOR | Freq: Every day | SUBCUTANEOUS | 2 refills | Status: DC

## 2017-07-05 ENCOUNTER — Other Ambulatory Visit: Payer: Self-pay

## 2017-07-05 MED ORDER — OMEPRAZOLE 40 MG PO CPDR: 40.0000 mg | DELAYED_RELEASE_CAPSULE | Freq: Every day | ORAL | 1 refills | Status: DC

## 2017-07-05 NOTE — Telephone Encounter (Signed)
   omeprazole (PRILOSEC) 40 MG capsule   Refill request @ [facility] on [address]. Pt is going out of town by tomorrow, requesting med to be filled ASAP.

## 2017-07-15 ENCOUNTER — Other Ambulatory Visit: Payer: Self-pay | Admitting: *Deleted

## 2017-07-15 MED ORDER — LORATADINE 10 MG PO TABS: ORAL_TABLET | ORAL | 0 refills | Status: DC

## 2017-07-24 ENCOUNTER — Other Ambulatory Visit: Payer: Self-pay | Admitting: Internal Medicine

## 2017-07-24 DIAGNOSIS — E113599 Type 2 diabetes mellitus with proliferative diabetic retinopathy without macular edema, unspecified eye: Secondary | ICD-10-CM

## 2017-07-24 DIAGNOSIS — Z794 Long term (current) use of insulin: Principal | ICD-10-CM

## 2017-09-20 ENCOUNTER — Encounter: Payer: BLUE CROSS/BLUE SHIELD | Admitting: Internal Medicine

## 2017-09-20 ENCOUNTER — Encounter: Payer: Self-pay | Admitting: Internal Medicine

## 2017-09-26 ENCOUNTER — Other Ambulatory Visit (HOSPITAL_COMMUNITY): Payer: Self-pay | Admitting: Surgery

## 2017-10-10 ENCOUNTER — Encounter: Payer: Self-pay | Admitting: Registered"

## 2017-10-10 ENCOUNTER — Encounter: Payer: BLUE CROSS/BLUE SHIELD | Attending: Surgery | Admitting: Registered"

## 2017-10-10 DIAGNOSIS — E119 Type 2 diabetes mellitus without complications: Secondary | ICD-10-CM

## 2017-10-10 NOTE — Patient Instructions (Signed)
-   Contact [facility] to verify nutrition appointments needed before surgery.

## 2017-10-10 NOTE — Progress Notes (Signed)
Pre-Op Assessment Visit:  Pre-Operative Sleeve gastrectomy Surgery  Medical Nutrition Therapy:  Appt start time: 4:15  End time:  5:30  Patient was seen on 10/10/2017 for Pre-Operative Nutrition Assessment. Assessment and [facility] of [facility] coordinator on 10/10/2017.   Pt expectation of surgery: needs 2 knee replacements, 2 ruptured discs in back; improve diabetes and blood pressure  Pt expectation of Dietitian: none stated  Start weight at [facility]: 280.0 BMI: 49.60   Pt is deaf in left ear. Pt states she is really excited about losing 38 lbs in a short amount of time (from 318 to 280 lbs). Pt states she does not like leftovers (except vegetables). Pt states she checks BS every once in a while: FBS (160-199) and at bedtime (140-160). Pt states last A1c was 9/11 (8.7). Pt states she only eats when hungry and sometimes will become too busy and forget to eat. Pt states she typically skips breakfast and lunch; eating only one meal a day and snacking when hungry. Pt states she stopped taking lovastatin and does not know why. Pt is having trouble recalling all current medication and allergies.   Pt states she will contact [facility] to verify amount of visits needed with us prior to surgery.    24 hr Dietary Recall: First Meal: skips Snack: none Second Meal: grapefruit or spaghetti and chicken breast Snack: grapefruit, ice cream sandwich Third Meal: bbq ribs, brown rice Snack: none Beverages: sprite, water, coffee, sweet tea  Encouraged to engage in 150 minutes of moderate physical activity including cardiovascular and weight baring weekly  Handouts given during visit include:  . Pre-Op Goals . Bariatric Surgery Protein Shakes . Vitamin and Mineral Recommendations  During the appointment today the following Pre-Op Goals were reviewed with the patient: . Track your food and beverage: MyFitness Pal or Baritastic App . Make healthy food  choices . Begin to limit portion sizes . Limited concentrated sugars and fried foods . Keep fat/sugar in the single digits per serving on          food labels . Practice CHEWING your food  (aim for 30 chews per bite or until applesauce consistency) . Practice not drinking 15 minutes before, during, and 30 minutes after each meal/snack . Avoid all carbonated beverages  . Avoid/limit caffeinated beverages  . Avoid all sugar-sweetened beverages . Avoid alcohol . Consume 3 meals per day; eat every 3-5 hours . Make a list of non-food related activities . Aim for 64-100 ounces of FLUID daily  . Aim for at least 60-80 grams of PROTEIN daily . Look for a liquid protein source that contain ?15 g protein and ?5 g carbohydrate  (ex: shakes, drinks, shots) . Physical activity is an important part of a healthy lifestyle so keep it moving!  Follow diet recommendations listed below Energy and Macronutrient Recommendations: Calories: 1600 Carbohydrate: 180 Protein: 120 Fat: 44  Demonstrated degree of understanding via:  Teach Back   Teaching Method Utilized:  Visual Auditory Hands on  Barriers to learning/adherence to lifestyle change: pre-contemplative stage of change  Patient to call the Nutrition and Diabetes Education Services to enroll in Pre-Op and Post-Op Nutrition Education when surgery date is scheduled.

## 2017-10-11 ENCOUNTER — Encounter: Payer: Self-pay | Admitting: Dietician

## 2017-10-11 ENCOUNTER — Telehealth: Payer: Self-pay | Admitting: Dietician

## 2017-10-11 NOTE — Telephone Encounter (Signed)
Sheila Ferguson is a 57 y.o. female who was contacted on behalf of [facility].  Left a message for return call 10/11/2017 Called on 10/12/2017; left message. She returned call  Diabetes Assessment  DM meds and BS checks -  "What medications are you taking for diabetes?" victoza 1.8, lantus 35, metformin 2000 mg  -  "How often do you check your blood sugars at home?" every now and then,  - "What have your blood sugars been?" 190s  High Blood Pressure Assessment  BP meds & BP checks -  "What medications are you taking for high blood pressure?" lisinopril, lopressor - "How often do you check your blood pressure at home?"  Slightly high, at doctors visit - "What have your blood pressure readings been?"   Coping with DM and BP - What else are you doing to help with your DM and BP -  - Diet? Lost 39 pounds, going to [employer] -  Exercise? none  Medication Access Issues  Medication Issues? -  "Are you getting your medicines refilled on time without skipping any doses?" no, only at night- misses bp pill 3x/week , I suggested maybe she try taking it with dinner - "Are you having any problems getting/taking your meds (cost, timing, transportation)?" no - Do you need any meds refilled? No   Conclusion  Close the call - Date of follow-up visit scheduled June gastric sleeve , her goal right now is to eat 3 meals a day - "Any other questions or concerns?" no   Call transferred to front office to reschedule her missed appointment

## 2017-10-18 ENCOUNTER — Other Ambulatory Visit: Payer: Self-pay | Admitting: *Deleted

## 2017-10-18 DIAGNOSIS — E113599 Type 2 diabetes mellitus with proliferative diabetic retinopathy without macular edema, unspecified eye: Secondary | ICD-10-CM

## 2017-10-18 DIAGNOSIS — Z794 Long term (current) use of insulin: Principal | ICD-10-CM

## 2017-10-18 MED ORDER — LIRAGLUTIDE 18 MG/3ML ~~LOC~~ SOPN: 1.8000 mg | PEN_INJECTOR | Freq: Every day | SUBCUTANEOUS | 2 refills | Status: DC

## 2017-10-18 NOTE — Telephone Encounter (Signed)
Next appt scheduled  2/26 with PCP. 

## 2017-10-20 ENCOUNTER — Other Ambulatory Visit: Payer: Self-pay | Admitting: Pharmacist

## 2017-10-20 DIAGNOSIS — E113599 Type 2 diabetes mellitus with proliferative diabetic retinopathy without macular edema, unspecified eye: Secondary | ICD-10-CM

## 2017-10-20 DIAGNOSIS — Z794 Long term (current) use of insulin: Principal | ICD-10-CM

## 2017-10-20 MED ORDER — LIRAGLUTIDE 18 MG/3ML ~~LOC~~ SOPN: 1.8000 mg | PEN_INJECTOR | Freq: Every day | SUBCUTANEOUS | 2 refills | Status: DC

## 2017-10-20 NOTE — Progress Notes (Signed)
Switched quantity on Victoza from 12 mL to 9 mL for 30-day supply

## 2017-10-26 ENCOUNTER — Other Ambulatory Visit: Payer: Self-pay | Admitting: *Deleted

## 2017-10-26 DIAGNOSIS — Z794 Long term (current) use of insulin: Principal | ICD-10-CM

## 2017-10-26 DIAGNOSIS — E113599 Type 2 diabetes mellitus with proliferative diabetic retinopathy without macular edema, unspecified eye: Secondary | ICD-10-CM

## 2017-10-26 MED ORDER — INSULIN GLARGINE 100 UNIT/ML ~~LOC~~ SOLN: SUBCUTANEOUS | 3 refills | Status: DC

## 2017-10-26 NOTE — Telephone Encounter (Signed)
Next appt scheduled  2/26 with PCP. 

## 2017-11-08 ENCOUNTER — Other Ambulatory Visit: Payer: Self-pay

## 2017-11-08 ENCOUNTER — Ambulatory Visit: Payer: BLUE CROSS/BLUE SHIELD | Admitting: Internal Medicine

## 2017-11-08 ENCOUNTER — Encounter: Payer: Self-pay | Admitting: Internal Medicine

## 2017-11-08 VITALS — BP 152/76 | HR 87 | Temp 98.1°F | Ht 64.0 in | Wt 280.2 lb

## 2017-11-08 DIAGNOSIS — E11319 Type 2 diabetes mellitus with unspecified diabetic retinopathy without macular edema: Secondary | ICD-10-CM

## 2017-11-08 DIAGNOSIS — E785 Hyperlipidemia, unspecified: Secondary | ICD-10-CM

## 2017-11-08 DIAGNOSIS — M1990 Unspecified osteoarthritis, unspecified site: Secondary | ICD-10-CM | POA: Diagnosis not present

## 2017-11-08 DIAGNOSIS — Z79899 Other long term (current) drug therapy: Secondary | ICD-10-CM

## 2017-11-08 DIAGNOSIS — E113599 Type 2 diabetes mellitus with proliferative diabetic retinopathy without macular edema, unspecified eye: Secondary | ICD-10-CM

## 2017-11-08 DIAGNOSIS — Z1159 Encounter for screening for other viral diseases: Secondary | ICD-10-CM

## 2017-11-08 DIAGNOSIS — I10 Essential (primary) hypertension: Secondary | ICD-10-CM

## 2017-11-08 DIAGNOSIS — Z794 Long term (current) use of insulin: Secondary | ICD-10-CM

## 2017-11-08 DIAGNOSIS — Z6841 Body Mass Index (BMI) 40.0 and over, adult: Secondary | ICD-10-CM | POA: Diagnosis not present

## 2017-11-08 DIAGNOSIS — Z0000 Encounter for general adult medical examination without abnormal findings: Secondary | ICD-10-CM

## 2017-11-08 LAB — GLUCOSE, CAPILLARY: Glucose-Capillary: 87 mg/dL (ref 65–99)

## 2017-11-08 LAB — POCT GLYCOSYLATED HEMOGLOBIN (HGB A1C): Hemoglobin A1C: 7.9

## 2017-11-08 MED ORDER — EMPAGLIFLOZIN 10 MG PO TABS: 10.0000 mg | ORAL_TABLET | Freq: Every day | ORAL | 1 refills | Status: DC

## 2017-11-08 NOTE — Progress Notes (Signed)
   CC: Diabetes follow up   HPI:  [name] is a 57 y.o. you F with a past medical history as described below who presents to the clinic for follow up of her diabetes and chronic medical conditions.   DM: With efforts to reduce her weight, she feels her diabetes control has also improved. She denies any sx or readings of hypoglycemia on checks.   Obesity: She is currently undergoing workup and counseling for potential gastric bypass surgery to aid in her weight reduction. She has done well and consistently lost weight. She is eating smaller portions with more salads and vegetables, less fried foods. She is hoping to complete the remaining steps of bariatric surgery process and have her bypass surgery this summer.   HTN: She reports good adherence to her medication regimen and her cramping remains improved. She notes her BP has been slightly elevated on other recent office visits.   HLD: Not currently taking statin due to pt's sister mentioning potential adverse effects with statins. Pt was not experiencing side effects and is open to restarting this.   HM: Had colonoscopy scheduled but it was cancelled due to needing to be at a different facility due to her weight at the time.   Past Medical History:  Diagnosis Date  . Anal fissure   . Anxiety   . Breast cancer (HCC)   . Congenital stricture of esophagus   . Depression   . Diabetes mellitus type II   . DIABETES MELLITUS, TYPE II, UNCONTROLLED 09/01/2007   04/10/13 eye exam neg proliferative DM retinopathy, +non proliferative DM retinopathy OS and OD +amblyopia OD   . Dysmetabolic syndrome   . Gall bladder disease    removed  . GERD (gastroesophageal reflux disease)   . Headache(784.0)   . Hypertension   . Sarcoid   . Sleep apnea    Review of Systems:  Review of Systems  Constitutional: Positive for weight loss (intentional ). Negative for fever.  Genitourinary: Negative for frequency.  Musculoskeletal: Positive for  joint pain (chronic arthritis ).     Physical Exam:  Vitals:   11/08/17 1605  BP: (!) 152/76  Pulse: 87  Temp: 98.1 F (36.7 C)  TempSrc: Oral  SpO2: 99%  Weight: 280 lb 3.2 oz (127.1 kg)  Height: 5\' 4"  (1.626 m)   General: Sitting in chair comfortably, no acute distress HEENT: Normal conjuctiva CV: RRR, no murmur appreciated  Resp: Clear breath sounds bilaterally, normal work of breathing, no distress  Abd: Soft, +BS, obese Neuro: Alert and oriented x3 Skin: Warm, dry      Assessment & Plan:   See Encounters Tab for problem based charting.  Patient discussed with Dr. Cleda DaubE. Hoffman

## 2017-11-08 NOTE — Patient Instructions (Addendum)
Good to see you again Sheila Ferguson.   Keep up the great work with your weight and diabetes! You're doing a great job and setting yourself up well for bypass surgery in the future.   For your diabetes, we are going to add a medicine called Jardiance or empagliflozin to avoid going up on your insulin but hopefully helping to lower your A1c further and improve your diabetes. Take this each morning.   For your blood pressure, we are going to hold off on starting a medicine and see how it is on your next visit.   I think it would be safe and beneficial for you to restart your cholesterol medicine.  Be sure to call your eye doctor for a diabetic eye exam and the GI doctors to reschedule the colonoscopy!

## 2017-11-09 ENCOUNTER — Telehealth: Payer: Self-pay | Admitting: *Deleted

## 2017-11-09 LAB — BMP8+ANION GAP
ANION GAP: 15 mmol/L (ref 10.0–18.0)
BUN/Creatinine Ratio: 10 (ref 9–23)
BUN: 10 mg/dL (ref 6–24)
CALCIUM: 10.1 mg/dL (ref 8.7–10.2)
CO2: 23 mmol/L (ref 20–29)
CREATININE: 1.04 mg/dL — AB (ref 0.57–1.00)
Chloride: 102 mmol/L (ref 96–106)
GFR calc Af Amer: 69 mL/min/{1.73_m2} (ref >59)
GFR, EST NON AFRICAN AMERICAN: 60 mL/min/{1.73_m2} (ref >59)
Glucose: 86 mg/dL (ref 65–99)
Potassium: 4.7 mmol/L (ref 3.5–5.2)
SODIUM: 140 mmol/L (ref 134–144)

## 2017-11-09 LAB — LIPID PANEL
CHOL/HDL RATIO: 3 ratio (ref 0.0–4.4)
Cholesterol, Total: 158 mg/dL (ref 100–199)
HDL: 52 mg/dL (ref >39)
LDL CALC: 86 mg/dL (ref 0–99)
TRIGLYCERIDES: 101 mg/dL (ref 0–149)
VLDL Cholesterol Cal: 20 mg/dL (ref 5–40)

## 2017-11-09 LAB — HEPATITIS C ANTIBODY: Hep C Virus Ab: 0.1 s/co ratio (ref 0.0–0.9)

## 2017-11-09 MED ORDER — SITAGLIPTIN PHOSPHATE 100 MG PO TABS: 100.0000 mg | ORAL_TABLET | Freq: Every day | ORAL | 1 refills | Status: DC

## 2017-11-09 NOTE — Assessment & Plan Note (Signed)
Pt on lovastatin, prior LDL 92, has co-morbid diabetes. She self-discontinued this medication recently due to her sister speaking of potential side effects of statins. Pt counseled on risk/benefit and stated she was tolerating well. She will resume this medicine and has a nearly full bottle at home. Will repeat lipid panel today.   --Cont Lovastatin 20 mg  --Lipid profile

## 2017-11-09 NOTE — Assessment & Plan Note (Addendum)
BP elevated today at 152/76, had previously had better control with regimen on metoprolol 100 mg BID, lisinopril 40 mg daily, and amlodipine 5 mg daily. She may benefit from addition of another agent such as a thiazide, as she did not tolerate higher dose of amlodipine. Will hold off on this change today given changes to DM regimen but will continue to follow closely and consider the addition at follow up. BMP today to get up to date info on renal fxn, lytes.   --Cont current regimen --BMP

## 2017-11-09 NOTE — Telephone Encounter (Signed)
[facility] and given rx for Jardiance.  Pt now states that she tried this medication in the past for 2 days and it gave her a "yeast infection".  Pt requesting an alternative medication.  Will send request to pcp for review.  Please advise.Kingsley SpittleGoldston, Adaya Garmany Cassady2/27/20199:36 AM

## 2017-11-09 NOTE — Assessment & Plan Note (Addendum)
A1c today 7.9 compared to 8.7 at last visit. She has been adherent on her regimen from changes made at last visit in September and has also been more successful with dietary changes and her goals of weight loss. Her meter download today still notes most values above target range (180-mid 200s) with values consistently highest in the morning prior to Lantus dosing. She may benefit from a higher Lantus dose but want to avoid potential weight gain. Will add an additional agent and elect for sglt-2 inhibitor with potential for further weight loss. She states she is established with an eye doctor and will call to make an appointment for diabetic eye exam (Dr. Nile RiggsShapiro).   **Pt called clinic back and notes trying Jardiance briefly in the past and was discontinued due to yeast infection. Will instead add sitagliptin (weight neutral) for continued improvement in A1c. Acarbose is another option though does have greater GI side effects.   --Cont Lantus, Metformin, Victoza --Add Sitagliptin 100 mg daily

## 2017-11-09 NOTE — Assessment & Plan Note (Signed)
Colonoscopy needs to be re-scheduled, pt states she will call GI office. Hepatitis C screening performed today with other labs. Pt wanted to think about potential pna vaccine prior to its administration.

## 2017-11-09 NOTE — Assessment & Plan Note (Signed)
She has continued to progress through the bariatric surgery program for potential gastric bypass and has done well thus far. Her weight is down 20 lbs since her last visit in Sept, currently at 280 lbs. Hopefully she continues to progress and remain a good candidate as this will likely provide a great benefit to her chronic medical conditions.

## 2017-11-10 NOTE — Progress Notes (Signed)
Internal Medicine Clinic Attending  Case discussed with Dr. Harden at the time of the visit.  We reviewed the resident's history and exam and pertinent patient test results.  I agree with the assessment, diagnosis, and plan of care documented in the resident's note.  

## 2017-11-11 NOTE — Telephone Encounter (Signed)
Attempted to contact pt to follow up our phone call-no answer, message left on recorder for call back if she has any questions about her new rx.Kingsley SpittleGoldston, Darlene Cassady3/1/201910:41 AM

## 2017-11-21 ENCOUNTER — Telehealth: Payer: Self-pay | Admitting: Dietician

## 2017-11-21 NOTE — Telephone Encounter (Signed)
Sheila Ferguson is a 57 y.o. female who was contacted on behalf of [facility]. No answer- left message.

## 2017-12-08 ENCOUNTER — Encounter (HOSPITAL_COMMUNITY): Payer: Self-pay | Admitting: Emergency Medicine

## 2017-12-08 ENCOUNTER — Emergency Department (HOSPITAL_COMMUNITY)
Admission: EM | Admit: 2017-12-08 | Discharge: 2017-12-09 | Disposition: A | Discharge status: Home / Self Care | Payer: BLUE CROSS/BLUE SHIELD | Attending: Emergency Medicine | Admitting: Emergency Medicine

## 2017-12-08 DIAGNOSIS — Z79899 Other long term (current) drug therapy: Secondary | ICD-10-CM | POA: Diagnosis not present

## 2017-12-08 DIAGNOSIS — Z7982 Long term (current) use of aspirin: Secondary | ICD-10-CM | POA: Insufficient documentation

## 2017-12-08 DIAGNOSIS — N182 Chronic kidney disease, stage 2 (mild): Secondary | ICD-10-CM | POA: Insufficient documentation

## 2017-12-08 DIAGNOSIS — E1122 Type 2 diabetes mellitus with diabetic chronic kidney disease: Secondary | ICD-10-CM | POA: Insufficient documentation

## 2017-12-08 DIAGNOSIS — T782XXA Anaphylactic shock, unspecified, initial encounter: Secondary | ICD-10-CM

## 2017-12-08 DIAGNOSIS — Z794 Long term (current) use of insulin: Secondary | ICD-10-CM | POA: Diagnosis not present

## 2017-12-08 DIAGNOSIS — T7802XA Anaphylactic reaction due to shellfish (crustaceans), initial encounter: Secondary | ICD-10-CM | POA: Diagnosis present

## 2017-12-08 DIAGNOSIS — Z853 Personal history of malignant neoplasm of breast: Secondary | ICD-10-CM | POA: Diagnosis not present

## 2017-12-08 DIAGNOSIS — R062 Wheezing: Secondary | ICD-10-CM | POA: Diagnosis not present

## 2017-12-08 DIAGNOSIS — R21 Rash and other nonspecific skin eruption: Secondary | ICD-10-CM | POA: Insufficient documentation

## 2017-12-08 DIAGNOSIS — R0602 Shortness of breath: Secondary | ICD-10-CM | POA: Insufficient documentation

## 2017-12-08 DIAGNOSIS — L299 Pruritus, unspecified: Secondary | ICD-10-CM | POA: Insufficient documentation

## 2017-12-08 DIAGNOSIS — I129 Hypertensive chronic kidney disease with stage 1 through stage 4 chronic kidney disease, or unspecified chronic kidney disease: Secondary | ICD-10-CM | POA: Insufficient documentation

## 2017-12-08 LAB — COMPREHENSIVE METABOLIC PANEL
ALT: 18 U/L (ref 14–54)
ANION GAP: 14 (ref 5–15)
AST: 22 U/L (ref 15–41)
Albumin: 3.6 g/dL (ref 3.5–5.0)
Alkaline Phosphatase: 123 U/L (ref 38–126)
BUN: 14 mg/dL (ref 6–20)
CHLORIDE: 103 mmol/L (ref 101–111)
CO2: 22 mmol/L (ref 22–32)
CREATININE: 1.23 mg/dL — AB (ref 0.44–1.00)
Calcium: 9.2 mg/dL (ref 8.9–10.3)
GFR calc non Af Amer: 48 mL/min — ABNORMAL LOW (ref >60)
GFR, EST AFRICAN AMERICAN: 56 mL/min — AB (ref >60)
Glucose, Bld: 300 mg/dL — ABNORMAL HIGH (ref 65–99)
Potassium: 3.9 mmol/L (ref 3.5–5.1)
SODIUM: 139 mmol/L (ref 135–145)
Total Bilirubin: 0.6 mg/dL (ref 0.3–1.2)
Total Protein: 7.4 g/dL (ref 6.5–8.1)

## 2017-12-08 LAB — CBC
HEMATOCRIT: 34.4 % — AB (ref 36.0–46.0)
Hemoglobin: 11.9 g/dL — ABNORMAL LOW (ref 12.0–15.0)
MCH: 28.2 pg (ref 26.0–34.0)
MCHC: 34.6 g/dL (ref 30.0–36.0)
MCV: 81.5 fL (ref 78.0–100.0)
PLATELETS: 386 10*3/uL (ref 150–400)
RBC: 4.22 MIL/uL (ref 3.87–5.11)
RDW: 13.9 % (ref 11.5–15.5)
WBC: 17.9 10*3/uL — AB (ref 4.0–10.5)

## 2017-12-08 LAB — I-STAT CHEM 8, ED
BUN: 16 mg/dL (ref 6–20)
CALCIUM ION: 1.13 mmol/L — AB (ref 1.15–1.40)
CREATININE: 1.1 mg/dL — AB (ref 0.44–1.00)
Chloride: 104 mmol/L (ref 101–111)
GLUCOSE: 296 mg/dL — AB (ref 65–99)
HCT: 37 % (ref 36.0–46.0)
HEMOGLOBIN: 12.6 g/dL (ref 12.0–15.0)
Potassium: 3.7 mmol/L (ref 3.5–5.1)
Sodium: 140 mmol/L (ref 135–145)
TCO2: 23 mmol/L (ref 22–32)

## 2017-12-08 MED ORDER — FAMOTIDINE IN NACL 20-0.9 MG/50ML-% IV SOLN
20.0000 mg | Freq: Once | INTRAVENOUS | Status: AC
Start: 2017-12-08 — End: 2017-12-08
  Administered 2017-12-08: 20 mg via INTRAVENOUS
  Filled 2017-12-08: qty 50

## 2017-12-08 MED ORDER — SODIUM CHLORIDE 0.9 % IV SOLN
INTRAVENOUS | Status: DC
Start: 2017-12-08 — End: 2017-12-09
  Administered 2017-12-08: 22:00:00 via INTRAVENOUS

## 2017-12-08 MED ORDER — DIPHENHYDRAMINE HCL 25 MG PO TABS: 25.0000 mg | ORAL_TABLET | Freq: Four times a day (QID) | ORAL | 0 refills | Status: DC

## 2017-12-08 MED ORDER — DIPHENHYDRAMINE HCL 50 MG/ML IJ SOLN
25.0000 mg | Freq: Once | INTRAMUSCULAR | Status: AC
Start: 2017-12-08 — End: 2017-12-08
  Administered 2017-12-08: 25 mg via INTRAVENOUS
  Filled 2017-12-08: qty 1

## 2017-12-08 MED ORDER — SODIUM CHLORIDE 0.9 % IV BOLUS
1000.0000 mL | Freq: Once | INTRAVENOUS | Status: AC
Start: 2017-12-08 — End: 2017-12-08
  Administered 2017-12-08: 1000 mL via INTRAVENOUS

## 2017-12-08 MED ORDER — METHYLPREDNISOLONE SODIUM SUCC 125 MG IJ SOLR
125.0000 mg | Freq: Once | INTRAMUSCULAR | Status: AC
Start: 2017-12-08 — End: 2017-12-08
  Administered 2017-12-08: 125 mg via INTRAVENOUS
  Filled 2017-12-08: qty 2

## 2017-12-08 MED ORDER — EPINEPHRINE 0.3 MG/0.3ML IJ SOAJ: 0.3000 mg | INTRAMUSCULAR | 0 refills | Status: AC | PRN

## 2017-12-08 NOTE — ED Provider Notes (Signed)
[facility] EMERGENCY DEPARTMENT Provider Note   CSN: [id number] Arrival date & time: 12/08/17  1934     History   Chief Complaint Chief Complaint  Patient presents with  . Allergic Reaction  . Loss of Consciousness    HPI Sheila Ferguson is a 57 y.o. female.  The history is provided by the patient.  Allergic Reaction  Presenting symptoms: difficulty breathing, itching, rash and wheezing   Severity:  Moderate Duration:  30 minutes Prior allergic episodes:  Food/nut allergies (shrimp. Pt eating crab legs but may have been contaminated with shrimp ) Context: food   Relieved by:  Epinephrine, antihistamines and bronchodilators got 50mg  PO benadryl and 0.3 of IM epi PTA  Past Medical History:  Diagnosis Date  . Anal fissure   . Anxiety   . Breast cancer (HCC)   . Congenital stricture of esophagus   . Depression   . Diabetes mellitus type II   . DIABETES MELLITUS, TYPE II, UNCONTROLLED 09/01/2007   04/10/13 eye exam neg proliferative DM retinopathy, +non proliferative DM retinopathy OS and OD +amblyopia OD   . Dysmetabolic syndrome   . Gall bladder disease    removed  . GERD (gastroesophageal reflux disease)   . Headache(784.0)   . Hypertension   . Sarcoid   . Sleep apnea     Patient Active Problem List   Diagnosis Date Noted  . Need for immunization against influenza 05/24/2017  . Immunity status testing 04/01/2017  . Acute bilateral back pain 11/29/2016  . Severe obesity (BMI >= 40) (HCC) 10/22/2016  . Lumbar radiculopathy, chronic 05/06/2016  . Bilateral thigh pain 10/07/2015  . CKD stage 2 due to type 2 diabetes mellitus (HCC) 09/04/2014  . Health care maintenance 10/05/2013  . Osteoarthritis of both knees 10/05/2013  . Hyperlipidemia 12/25/2012  . Anxiety and depression 12/28/2011  . MIGRAINE HEADACHE 01/13/2008  . GERD 01/13/2008  . Essential hypertension 02/09/2007  . Diabetes mellitus type 2 with retinopathy (HCC) 08/31/2004     Past Surgical History:  Procedure Laterality Date  . ABDOMINAL HYSTERECTOMY    . CHOLECYSTECTOMY    . ESOPHAGEAL DILATION     x2  . OOPHORECTOMY     right, due to endometriosis     OB History   None      Home Medications    Prior to Admission medications   Medication Sig Start Date End Date Taking? Authorizing Provider  acetaminophen (TYLENOL) 500 MG tablet Take 2 tablets (1,000 mg total) by mouth 3 (three) times daily. Patient taking differently: Take 500-1,000 mg by mouth every 8 (eight) hours as needed for mild pain or headache.  01/13/16  Yes Rivet, Carly J, MD  amLODipine (NORVASC) 10 MG tablet Take 1 tablet (10 mg total) by mouth daily. 10/29/16  Yes Lora Paula, MD  aspirin 325 MG tablet Take 325 mg by mouth daily.   Yes [provider]  citalopram (CELEXA) 40 MG tablet Take 1 tablet (40 mg total) by mouth daily. Patient taking differently: Take 40 mg by mouth every evening.  04/01/17  Yes Ginger Carne, MD  dextrose (RELION GLUCOSE) 40 % GEL Take 37.5 g by mouth once as needed for low blood sugar. 01/23/16  Yes Rivet, Iris Pert, MD  diclofenac sodium (VOLTAREN) 1 % GEL Apply 2 g topically 4 (four) times daily. 10/22/16  Yes Lora Paula, MD  gabapentin (NEURONTIN) 300 MG capsule Take 1 capsule (300 mg total) by mouth 3 (three) times  daily. Patient taking differently: Take 300 mg by mouth 2 (two) times daily.  06/01/17  Yes Earl LagosNarendra, Nischal, MD  hydrocortisone (ANUSOL-HC) 25 MG suppository Place 1 suppository (25 mg total) rectally 2 (two) times daily. Patient taking differently: Place 25 mg rectally 2 (two) times daily as needed for hemorrhoids.  10/01/15  Yes Rivet, Carly J, MD  insulin glargine (LANTUS) 100 UNIT/ML injection INJECT 35 UNITS SUBCUTANEOUSLY ONCE DAILY. Diagnosis code E11.3599, Z79.4 Patient taking differently: Inject 35 Units into the skin daily before breakfast. Diagnosis code E11.3599, Z79.4 10/26/17  Yes Ginger CarneHarden, Kyler, MD  liraglutide  (VICTOZA) 18 MG/3ML SOPN Inject 0.3 mLs (1.8 mg total) into the skin daily. 10/20/17 01/18/18 Yes Mliss FritzKim, Jennifer J, PharmD  lisinopril (PRINIVIL,ZESTRIL) 40 MG tablet Take 1 tablet (40 mg total) by mouth daily. 04/01/17  Yes Ginger CarneHarden, Kyler, MD  loratadine (CLARITIN) 10 MG tablet TAKE ONE TABLET BY MOUTH ONCE DAILY AS NEEDED FOR ALLERGIES Patient taking differently: Take 10 mg by mouth at bedtime.  07/15/17  Yes Ginger CarneHarden, Kyler, MD  metFORMIN (GLUCOPHAGE XR) 500 MG 24 hr tablet Take 4 tablets (2,000 mg total) by mouth daily with breakfast. Patient taking differently: Take 500-1,500 mg by mouth See admin instructions. Take 1,500 mg by mouth in the morning and 500 mg at bedtime 10/29/16 12/08/17 Yes Lora PaulaKrall, Jennifer T, MD  metoprolol tartrate (LOPRESSOR) 100 MG tablet Take 1 tablet (100 mg total) by mouth 2 (two) times daily. 04/01/17  Yes Ginger CarneHarden, Kyler, MD  omeprazole (PRILOSEC) 40 MG capsule Take 1 capsule (40 mg total) by mouth daily. 07/05/17  Yes Ginger CarneHarden, Kyler, MD  sitaGLIPtin (JANUVIA) 100 MG tablet Take 1 tablet (100 mg total) by mouth daily. Patient taking differently: Take 100 mg by mouth every evening.  11/09/17  Yes Ginger CarneHarden, Kyler, MD  BAYER MICROLET LANCETS lancets Check blood sugar up to 5 times daily 01/23/16   Rivet, Iris Pertarly J, MD  Continuous Blood Gluc Receiver (FREESTYLE LIBRE READER) DEVI 1 each by Does not apply route 4 (four) times daily. 06/06/17   Ginger CarneHarden, Kyler, MD  Continuous Blood Gluc Sensor (FREESTYLE LIBRE SENSOR SYSTEM) MISC Use to check blood sugar at least 4 times daily 06/06/17   Ginger CarneHarden, Kyler, MD  diphenhydrAMINE (BENADRYL) 25 MG tablet Take 1 tablet (25 mg total) by mouth every 6 (six) hours for 4 doses. 12/08/17 12/09/17  Dwana Melenaong, Shanai Lartigue, DO  EPINEPHrine 0.3 mg/0.3 mL IJ SOAJ injection Inject 0.3 mLs (0.3 mg total) into the muscle as needed (for anaphylaxis). 12/08/17   Dwana Melenaong, Decklyn Hornik, DO  glucose blood (BAYER CONTOUR NEXT TEST) test strip Use to check blood sugar up to 4 times a day before meals and  bedtime and anytime you feel like you are having a low blood sugar 06/13/17   Ginger CarneHarden, Kyler, MD  Insulin Syringe-Needle U-100 31G X 15/64" 0.5 ML MISC Use to inject insulin two times a day 01/23/16   Rivet, Iris Pertarly J, MD  lovastatin (MEVACOR) 20 MG tablet TAKE ONE TABLET BY MOUTH AT BEDTIME Patient not taking: Reported on 12/08/2017 06/04/16   Su Hoffivet, Carly J, MD    Family History Family History  Problem Relation Age of Onset  . Sarcoidosis Father   . Leukemia Father   . Hypertension Mother   . Diabetes Mother   . Heart attack Mother   . Aneurysm Mother        CNS  . Hypertension Sister   . Diabetes Sister   . Sleep apnea Sister   . Asthma Other   .  Cancer Other   . Heart disease Other   . Stroke Other     Social History Social History   Tobacco Use  . Smoking status: Never Smoker  . Smokeless tobacco: Never Used  Substance Use Topics  . Alcohol use: No    Alcohol/week: 0.0 oz  . Drug use: No     Allergies   Ceftin [cefuroxime axetil]; Shrimp [shellfish allergy]; Pineapple; Adhesive [tape]; and Azithromycin   Review of Systems Review of Systems  Constitutional: Negative for chills and fever.  HENT: Negative for ear pain and sore throat.        Throat tingling  Eyes: Negative for pain and visual disturbance.  Respiratory: Positive for shortness of breath and wheezing. Negative for cough.   Cardiovascular: Negative for chest pain and palpitations.  Gastrointestinal: Negative for abdominal pain, nausea and vomiting.  Genitourinary: Negative for dysuria and hematuria.  Musculoskeletal: Negative for back pain and neck pain.  Skin: Positive for itching and rash. Negative for color change.  Neurological: Positive for syncope and light-headedness. Negative for headaches.  All other systems reviewed and are negative.    Physical Exam Updated Vital Signs BP (!) 174/95 (BP Location: Left Arm)   Pulse (!) 107   Temp 98 F (36.7 C) (Oral)   Resp (!) 30   Ht 5\' 4"  (1.626  m)   Wt 127.1 kg (280 lb 3.3 oz)   SpO2 99%   BMI 48.10 kg/m   Physical Exam  Constitutional: She is oriented to person, place, and time. She appears well-developed and well-nourished. No distress.  HENT:  Head: Normocephalic and atraumatic.  Mouth/Throat: Oropharynx is clear and moist.  Eyes:  B/l conjunctival injection  Neck: Neck supple.  Cardiovascular: Regular rhythm. Tachycardia present.  No murmur heard. Pulmonary/Chest: Effort normal and breath sounds normal. No stridor. No respiratory distress. She has no wheezes. She has no rales.  Abdominal: Soft. She exhibits no distension. There is no tenderness. There is no guarding.  Musculoskeletal: She exhibits no edema.  Neurological: She is alert and oriented to person, place, and time.  Skin: Skin is warm and dry.  Psychiatric: She has a normal mood and affect.  Nursing note and vitals reviewed.    ED Treatments / Results  Labs (all labs ordered are listed, but only abnormal results are displayed) Labs Reviewed  CBC - Abnormal; Notable for the following components:      Result Value   WBC 17.9 (*)    Hemoglobin 11.9 (*)    HCT 34.4 (*)    All other components within normal limits  COMPREHENSIVE METABOLIC PANEL - Abnormal; Notable for the following components:   Glucose, Bld 300 (*)    Creatinine, Ser 1.23 (*)    GFR calc non Af Amer 48 (*)    GFR calc Af Amer 56 (*)    All other components within normal limits  I-STAT CHEM 8, ED - Abnormal; Notable for the following components:   Creatinine, Ser 1.10 (*)    Glucose, Bld 296 (*)    Calcium, Ion 1.13 (*)    All other components within normal limits    EKG EKG Interpretation  Date/Time:  Thursday December 08 2017 22:42:49 EDT Ventricular Rate:  105 PR Interval:    QRS Duration: 95 QT Interval:  351 QTC Calculation: 464 R Axis:   33 Text Interpretation:  Sinus tachycardia Low voltage, precordial leads No significant change since last tracing Confirmed by Richardean Canal [phone]) on 12/08/2017  10:52:52 PM   Radiology No results found.  Procedures Procedures (including critical care time)  Medications Ordered in ED Medications  sodium chloride 0.9 % bolus 1,000 mL (0 mLs Intravenous Stopped 12/08/17 2105)    And  0.9 %  sodium chloride infusion ( Intravenous New Bag/Given 12/08/17 2141)  diphenhydrAMINE (BENADRYL) injection 25 mg (25 mg Intravenous Given 12/08/17 2007)  methylPREDNISolone sodium succinate (SOLU-MEDROL) 125 mg/2 mL injection 125 mg (125 mg Intravenous Given 12/08/17 2007)  famotidine (PEPCID) IVPB 20 mg premix (0 mg Intravenous Stopped 12/08/17 2037)     Initial Impression / Assessment and Plan / ED Course  I have reviewed the triage vital signs and the nursing notes.  Pertinent labs & imaging results that were available during my care of the patient were reviewed by me and considered in my medical decision making (see chart for details).     Patient is a 57 year old female with history of diabetes, depression, anxiety, gallbladder disease, hypertension who presents with anaphylactic reaction.  Patient was eating crab legs but she has allergy to shrimp.  Likely this was cross contaminated at the restaurant.  She started having tingling in the back of her throat, rash and burning to bilateral ears and part of her face.  She started feeling lightheaded and actually had a syncopal event.  Initial blood pressure with the fire department was 70/40.  Patient received IM epinephrine and 50 mg of p.o. Benadryl in route as well as albuterol nebulizer with improvement in symptoms.  She was noted to have wheezing as well but that had improved by the time she arrived.  Patient states she is feeling better and no longer lightheaded or short of breath.  IV access established patient given IV bolus fluids, EKG shows sinus tachycardia but no signs of ischemia or arrhythmia.  IV Solu-Medrol, famotidine and Benadryl given.  Patient monitored for 3 hours  and she had no recurrence of any allergic type reaction.  Patient will be discharged with Benadryl every 6 hours for 24 hours and EpiPen for future use.  Patient instructed to follow-up with the allergy clinic in [facility] for allergy testing.  Patient counseled on being very careful at restaurants where from his ingredient or on the menu.  Final Clinical Impressions(s) / ED Diagnoses   Final diagnoses:  Anaphylaxis, initial encounter    ED Discharge Orders        Ordered    EPINEPHrine 0.3 mg/0.3 mL IJ SOAJ injection  As needed    Note to Pharmacy:  Dispense two Epipens   12/08/17 2304    diphenhydrAMINE (BENADRYL) 25 MG tablet  Every 6 hours     12/08/17 2304       Dwana Melena, DO 12/08/17 2321    Charlynne Pander, MD 12/09/17 [phone]

## 2017-12-08 NOTE — ED Triage Notes (Signed)
  Patient was eating at a seafood restaurant and had allergic reaction to shrimp.  Patient had syncopal episode before EMS arrival but was alert and oriented x 4 on arrival.  Patient was transported on albuterol neb and was given benadryl PO and epi IM.  Patient has history of diabetes and HTN.

## 2018-01-04 ENCOUNTER — Other Ambulatory Visit: Payer: Self-pay | Admitting: *Deleted

## 2018-01-04 DIAGNOSIS — I10 Essential (primary) hypertension: Secondary | ICD-10-CM

## 2018-01-04 MED ORDER — METOPROLOL TARTRATE 100 MG PO TABS: 100.0000 mg | ORAL_TABLET | Freq: Two times a day (BID) | ORAL | 3 refills | Status: DC

## 2018-01-16 ENCOUNTER — Other Ambulatory Visit: Payer: Self-pay | Admitting: *Deleted

## 2018-01-16 DIAGNOSIS — E785 Hyperlipidemia, unspecified: Secondary | ICD-10-CM

## 2018-01-16 MED ORDER — LOVASTATIN 20 MG PO TABS: 20.0000 mg | ORAL_TABLET | Freq: Every day | ORAL | 3 refills | Status: AC

## 2018-01-17 ENCOUNTER — Other Ambulatory Visit: Payer: Self-pay | Admitting: *Deleted

## 2018-01-17 MED ORDER — OMEPRAZOLE 40 MG PO CPDR: 40.0000 mg | DELAYED_RELEASE_CAPSULE | Freq: Every day | ORAL | 1 refills | Status: DC

## 2018-01-20 ENCOUNTER — Other Ambulatory Visit: Payer: Self-pay | Admitting: Pharmacist

## 2018-01-20 DIAGNOSIS — Z794 Long term (current) use of insulin: Principal | ICD-10-CM

## 2018-01-20 DIAGNOSIS — E113599 Type 2 diabetes mellitus with proliferative diabetic retinopathy without macular edema, unspecified eye: Secondary | ICD-10-CM

## 2018-01-27 ENCOUNTER — Telehealth: Payer: Self-pay | Admitting: Dietician

## 2018-01-27 NOTE — Telephone Encounter (Signed)
Calling on behalf of geriatric task Force( [facility]). Left message for return call.

## 2018-02-07 ENCOUNTER — Encounter: Payer: BLUE CROSS/BLUE SHIELD | Admitting: Internal Medicine

## 2018-02-12 ENCOUNTER — Other Ambulatory Visit: Payer: Self-pay | Admitting: Internal Medicine

## 2018-02-12 DIAGNOSIS — Z794 Long term (current) use of insulin: Principal | ICD-10-CM

## 2018-02-12 DIAGNOSIS — E113599 Type 2 diabetes mellitus with proliferative diabetic retinopathy without macular edema, unspecified eye: Secondary | ICD-10-CM

## 2018-02-14 MED ORDER — METFORMIN HCL ER 500 MG PO TB24: 2000.0000 mg | ORAL_TABLET | Freq: Every day | ORAL | 3 refills | Status: DC

## 2018-02-28 ENCOUNTER — Other Ambulatory Visit: Payer: Self-pay | Admitting: Internal Medicine

## 2018-02-28 DIAGNOSIS — E113599 Type 2 diabetes mellitus with proliferative diabetic retinopathy without macular edema, unspecified eye: Secondary | ICD-10-CM

## 2018-02-28 DIAGNOSIS — Z794 Long term (current) use of insulin: Principal | ICD-10-CM

## 2018-03-01 NOTE — Telephone Encounter (Signed)
Refill for Lantus placed. She missed her last appt and needs to reschedule. Should also be initiated on a statin at follow up

## 2018-03-07 ENCOUNTER — Encounter: Payer: Self-pay | Admitting: Registered"

## 2018-03-07 ENCOUNTER — Encounter: Payer: BLUE CROSS/BLUE SHIELD | Attending: Surgery | Admitting: Registered"

## 2018-03-07 DIAGNOSIS — Z6841 Body Mass Index (BMI) 40.0 and over, adult: Secondary | ICD-10-CM | POA: Diagnosis not present

## 2018-03-07 DIAGNOSIS — Z713 Dietary counseling and surveillance: Secondary | ICD-10-CM | POA: Insufficient documentation

## 2018-03-07 DIAGNOSIS — E119 Type 2 diabetes mellitus without complications: Secondary | ICD-10-CM

## 2018-03-07 NOTE — Patient Instructions (Addendum)
-   Aim to have vegetables with lunch and dinner.   - Aim to eat more well-balanced breakfast. Aim to pair carbohydrate with protein.   - Aim to track blood sugar numbers 4 times/day - fasting and 2 hours after meals.

## 2018-03-07 NOTE — Progress Notes (Signed)
Sleeve Gastrectomy Appt start time: 1:57 end time: 2:30  Assessment: 1st SW[facility] Appointment.   Start Wt at [facility]: 280.0 Wt: 280.9 BMI: 48.22   Pt arrives having maintained weight from previous visit.   Pt states she is drinking at least 64 ounces of fluid a day. Pt states she experiences pain in her stomach when she eats. Pt states she would rather sleep than eat. Pt states she checks her BS whenever she feels like it ranginig between 49-339 over the last 2 months. Pt last A1c 7.9 (10/2017) per labs. Pt states she has had diabetes for 15 years; states she is tired of it.   Pt states she believes she needs 4 visits with us prior to having surgery.   MEDICATIONS: See list   DIETARY INTAKE:  24-hr recall:  B ( AM): coffee, peanut butter crackers  Snk ( AM): none  L ( PM): Roamen noodles Snk ( PM): sunflower seeds or grapes or wheat chips D ( PM): brown rice, pink salmon, pinto beans, corn on the cob Snk ( PM): none Beverages: water, juice, sweet tea, coffee  Usual physical activity: none stated; 2 painfl knees, 2 disruptreud disc in back, nerove pain rlt diabetes in both feet  Diet to Follow: 1600 calories 180 g carbohydrates 120 g protein 44 g fat  Preferred Learning Style:   No preference indicated   Learning Readiness:   Contemplating  Ready  Change in progress     Nutritional Diagnosis:  [place]-3.3 Overweight/obesity related to past poor dietary habits and physical inactivity as evidenced by patient w/ planned sleeve gastrectomy surgery following dietary guidelines for continued weight loss.    Intervention:  Nutrition counseling for upcoming Bariatric Surgery. Pt was educated and counseled on the importance of tracking blood sugar and managing diabetes well. Pt was reminded of financial obligations related to bariatric surgery and the importance of making behavioral lifestyle changes.  Goals:  - Aim for 150 minutes of physical activity including cardio and weight  bearing every week - Aim to have vegetables with lunch and dinner.  - Aim to eat more well-balanced breakfast. Aim to pair carbohydrate with protein.  - Aim to track blood sugar numbers 4 times/day - fasting and 2 hours after meals.   Teaching Method Utilized:  Visual Auditory Hands on  Handouts given during visit include:  none  Barriers to learning/adherence to lifestyle change: precontemplative stage of change  Demonstrated degree of understanding via:  Teach Back   Monitoring/Evaluation:  Dietary intake, exercise, and body weight in 1 month(s).

## 2018-03-24 ENCOUNTER — Ambulatory Visit: Payer: BLUE CROSS/BLUE SHIELD | Admitting: Internal Medicine

## 2018-03-24 ENCOUNTER — Other Ambulatory Visit: Payer: Self-pay

## 2018-03-24 ENCOUNTER — Encounter: Payer: Self-pay | Admitting: Internal Medicine

## 2018-03-24 VITALS — BP 132/82 | HR 89 | Temp 98.2°F | Ht 64.0 in | Wt 284.1 lb

## 2018-03-24 DIAGNOSIS — E11319 Type 2 diabetes mellitus with unspecified diabetic retinopathy without macular edema: Secondary | ICD-10-CM | POA: Diagnosis not present

## 2018-03-24 DIAGNOSIS — Z0000 Encounter for general adult medical examination without abnormal findings: Secondary | ICD-10-CM

## 2018-03-24 DIAGNOSIS — G8929 Other chronic pain: Secondary | ICD-10-CM

## 2018-03-24 DIAGNOSIS — Z79891 Long term (current) use of opiate analgesic: Secondary | ICD-10-CM

## 2018-03-24 DIAGNOSIS — E113599 Type 2 diabetes mellitus with proliferative diabetic retinopathy without macular edema, unspecified eye: Secondary | ICD-10-CM | POA: Diagnosis not present

## 2018-03-24 DIAGNOSIS — F339 Major depressive disorder, recurrent, unspecified: Secondary | ICD-10-CM

## 2018-03-24 DIAGNOSIS — F419 Anxiety disorder, unspecified: Secondary | ICD-10-CM

## 2018-03-24 DIAGNOSIS — I10 Essential (primary) hypertension: Secondary | ICD-10-CM

## 2018-03-24 DIAGNOSIS — M170 Bilateral primary osteoarthritis of knee: Secondary | ICD-10-CM | POA: Diagnosis not present

## 2018-03-24 DIAGNOSIS — Z794 Long term (current) use of insulin: Secondary | ICD-10-CM

## 2018-03-24 DIAGNOSIS — Z79899 Other long term (current) drug therapy: Secondary | ICD-10-CM

## 2018-03-24 DIAGNOSIS — Z6841 Body Mass Index (BMI) 40.0 and over, adult: Secondary | ICD-10-CM

## 2018-03-24 DIAGNOSIS — M549 Dorsalgia, unspecified: Secondary | ICD-10-CM

## 2018-03-24 LAB — POCT GLYCOSYLATED HEMOGLOBIN (HGB A1C): HEMOGLOBIN A1C: 7.9 % — AB (ref 4.0–5.6)

## 2018-03-24 LAB — GLUCOSE, CAPILLARY: GLUCOSE-CAPILLARY: 148 mg/dL — AB (ref 70–99)

## 2018-03-24 NOTE — Patient Instructions (Addendum)
Hello Mrs.Demario  You came to us for a routine check-up. We did a blood test to see how your kidney and diabetes is doing. We will call you and let you know the results. Meanwhile, we suggest that you STOP using your Mingo AmberJenuvia as it is not very effective while taking all of your other diabetic medications. Also we suggest you follow up with us after you do your surgery. Best of luck with everything and thank you for coming to the clinic.  -Judeth CornfieldJoshua Hughey Rittenberry

## 2018-03-24 NOTE — Progress Notes (Signed)
   CC: Diabetes, hypertension management  HPI: [name] is a 57 y.o. F w/ PMH of morbid obesity, hypertension, diabetes mellitus, and depression and anxiety. She comes to the clinic today for routine 3 month check up. She states she is tolerating her diabetes medication w/o episodes of hypoglycemia but she has questions about whether her dosages should be changed because she will have a sleeve gastrectomy next month with Dr.Martin w/ [facility]. She brought her glucometer which shows 4 readings averaging 175 fasting blood glucose. She denies any F/N/V/D/C/ light-headedness/ coldsweats/ palpitations  Past Medical History:  Diagnosis Date  . Anal fissure   . Anxiety   . Breast cancer (HCC)   . Congenital stricture of esophagus   . Depression   . Diabetes mellitus type II   . DIABETES MELLITUS, TYPE II, UNCONTROLLED 09/01/2007   04/10/13 eye exam neg proliferative DM retinopathy, +non proliferative DM retinopathy OS and OD +amblyopia OD   . Dysmetabolic syndrome   . Gall bladder disease    removed  . GERD (gastroesophageal reflux disease)   . Headache(784.0)   . Hypertension   . Sarcoid   . Sleep apnea     Review of Systems: Review of Systems  Constitutional: Negative for chills, fever and malaise/fatigue.  Respiratory: Negative for cough, shortness of breath and wheezing.   Cardiovascular: Negative for chest pain, palpitations and orthopnea.  Gastrointestinal: Negative for constipation, diarrhea, nausea and vomiting.  Musculoskeletal: Positive for back pain and joint pain (bilateral knee pain).  Neurological: Negative for dizziness, sensory change, weakness and headaches.     Physical Exam: Vitals:   03/24/18 1540 03/24/18 1642  BP: (!) 162/81 132/82  Pulse: 89   Temp: 98.2 F (36.8 C)   TempSrc: Oral   SpO2: 99%   Weight: 284 lb 1.6 oz (128.9 kg)   Height: 5\' 4"  (1.626 m)     Physical Exam  Constitutional: She is oriented to person, place,  and time. She appears well-developed and well-nourished. No distress.  HENT:  Head: Atraumatic.  Mouth/Throat: Oropharynx is clear and moist.  Eyes: Pupils are equal, round, and reactive to light. Conjunctivae and EOM are normal. No scleral icterus.  Neck: Normal range of motion. Neck supple. No JVD present.  Respiratory: Effort normal and breath sounds normal. No respiratory distress. She has no wheezes.  GI: Soft. Bowel sounds are normal. She exhibits no distension. There is no tenderness. There is no guarding.  Musculoskeletal: Normal range of motion.  Bilateral knees painful on extension and flexion  Lymphadenopathy:    She has no cervical adenopathy.  Neurological: She is alert and oriented to person, place, and time. She has normal reflexes.     Assessment & Plan:   See Encounters Tab for problem based charting.  Patient seen with Dr. Criselda PeachesMullen   -Judeth CornfieldJoshua Lee, PGY1

## 2018-03-25 LAB — BMP8+ANION GAP
ANION GAP: 15 mmol/L (ref 10.0–18.0)
BUN/Creatinine Ratio: 10 (ref 9–23)
BUN: 11 mg/dL (ref 6–24)
CALCIUM: 9.6 mg/dL (ref 8.7–10.2)
CHLORIDE: 100 mmol/L (ref 96–106)
CO2: 25 mmol/L (ref 20–29)
CREATININE: 1.07 mg/dL — AB (ref 0.57–1.00)
GFR calc Af Amer: 67 mL/min/{1.73_m2} (ref >59)
GFR calc non Af Amer: 58 mL/min/{1.73_m2} — ABNORMAL LOW (ref >59)
GLUCOSE: 146 mg/dL — AB (ref 65–99)
Potassium: 4.5 mmol/L (ref 3.5–5.2)
Sodium: 140 mmol/L (ref 134–144)

## 2018-03-26 ENCOUNTER — Encounter: Payer: Self-pay | Admitting: Internal Medicine

## 2018-03-26 NOTE — Assessment & Plan Note (Addendum)
-   Pt is overdue for colonoscopy, pap smear and mammogram - States she wishes to focus on the upcoming bariatric surgery next month - States she sees an ob/gyn who can perform the pap smear and mammogram because she has never done it at IM clinic - Will order colonoscopy after she has successfully recovered from her bariatric sugery

## 2018-03-26 NOTE — Assessment & Plan Note (Addendum)
-   Patient comes for regular DM check ups - She was started on Januvia last visit because her glucose was above target (180~mid200) - In addition, taking Lantus, metformin, Victoza - Glucometer data today shows 4 readings averaging 170s fasting over the last 2 weeks - Last A1c on 11/09/17 7.9, Today's POC Hgb A1c unchanged at 7.9 - Counseled patient on mechanism of action of Victoza & Januvia and lack of efficacy when both are used together - Stop Januvia 100mg  daily - Continue w/ Lantus 33 units qhs, metformin 2000mg  daily, Victoza 1.8mg  daily - Return in 3 months or after her bariatric surgery for discussion about further diabetic med dosage changes

## 2018-03-26 NOTE — Assessment & Plan Note (Addendum)
-   Pt complains of bilateral knee pain which are chronic - States she was evaluated by ortho who refuse to do surgery until she loses weight - Pt is scheduled for bariatric surgery late August, early September of 2019 - Pt requesting tramadol refill 50mg  BID - Informed the pt that she needs to get that refilled by her orthopedist or whoever prescribed them because we have no record of prior prescription

## 2018-03-26 NOTE — Assessment & Plan Note (Signed)
BP Readings from Last 3 Encounters:  03/24/18 132/82  12/08/17 (!) 174/95  11/08/17 (!) 152/76   - Blood pressure at goal w/ current regimen - Pt describes no episodes of hypotension - BMP today to check for electrolytes and renal function - C/w current regimen: metoprolol 100mg  BID, lisinopril 40mg  daily, amlodipine 5mg  daily

## 2018-03-28 ENCOUNTER — Telehealth: Payer: Self-pay | Admitting: Gastroenterology

## 2018-03-28 NOTE — Telephone Encounter (Signed)
She sure can

## 2018-03-28 NOTE — Progress Notes (Signed)
Internal Medicine Clinic Attending  I saw and evaluated the patient.  I personally confirmed the key portions of the history and exam documented by Dr. Lee and I reviewed pertinent patient test results.  The assessment, diagnosis, and plan were formulated together and I agree with the documentation in the resident's note.  

## 2018-03-28 NOTE — Telephone Encounter (Signed)
Received referral for patient to have colonoscopy rescheduled from last year with Dr.Stark. [facility] of 52. Pt now has BMI of 48, but is diabetic and states she is getting gastric bypass sleeve next month. Can pt schedule at office? Please advise.

## 2018-04-03 ENCOUNTER — Other Ambulatory Visit: Payer: Self-pay | Admitting: Internal Medicine

## 2018-04-03 ENCOUNTER — Encounter: Payer: Self-pay | Admitting: *Deleted

## 2018-04-03 MED ORDER — LORATADINE 10 MG PO TABS: ORAL_TABLET | ORAL | 1 refills | Status: DC

## 2018-04-11 ENCOUNTER — Ambulatory Visit (INDEPENDENT_AMBULATORY_CARE_PROVIDER_SITE_OTHER): Payer: BLUE CROSS/BLUE SHIELD | Admitting: Psychiatry

## 2018-04-11 ENCOUNTER — Other Ambulatory Visit: Payer: Self-pay | Admitting: Internal Medicine

## 2018-04-11 DIAGNOSIS — F509 Eating disorder, unspecified: Secondary | ICD-10-CM | POA: Diagnosis not present

## 2018-04-11 DIAGNOSIS — I10 Essential (primary) hypertension: Secondary | ICD-10-CM

## 2018-04-13 ENCOUNTER — Other Ambulatory Visit: Payer: Self-pay | Admitting: *Deleted

## 2018-04-13 DIAGNOSIS — I10 Essential (primary) hypertension: Secondary | ICD-10-CM

## 2018-04-13 MED ORDER — AMLODIPINE BESYLATE 10 MG PO TABS: 10.0000 mg | ORAL_TABLET | Freq: Every day | ORAL | 3 refills | Status: AC

## 2018-04-17 ENCOUNTER — Telehealth: Payer: Self-pay | Admitting: Dietician

## 2018-04-17 ENCOUNTER — Encounter: Payer: Self-pay | Admitting: Dietician

## 2018-04-17 NOTE — Telephone Encounter (Signed)
Sheila Ferguson is a 57 y.o. female who was contacted on behalf of [facility]. Almost ready to have metabolic/Bariatric surgery. She hopes this month or September.   Diabetes Assessment  DM meds and BS checks -  "What medications are you taking for diabetes?" victoza, lantus, metformin, januvia -  "How often do you check your blood sugars at home?" 1x/day  - "What have your blood sugars been?" high over 200, lowest 189, a1c 7.9%  High Blood Pressure Assessment  BP meds & BP checks -  "What medications are you taking for high blood pressure?" meto, lisinpril, amlodipine - "How often do you check your blood pressure at home?" no - "What have your blood pressure readings been?"   Coping with DM and BP - What else are you doing to help with your DM and BP - Diet? Has seen a dietitian twice, she is drinking  64 oz water a day, not eating a special diet -  Exercise? Cannot do any because of her back, hips and knees- waiting total knee replacements  Medication Access Issues  Medication Issues? -  "Are you getting your medicines refilled on time without skipping any doses?" no - "Are you having any problems getting/taking your meds (cost, timing, transportation)?" no  - Do you need any meds refilled? no  Conclusion  Close the call - Date of follow-up visit scheduled- October 2019 - "Any other questions or concerns?" physical, colonoscopy, mamogram all in October.   Advanced directives- has not completed- started papers at church but needs to have them notarized. Informed her that chaplains may be able to help when she is in the hospital.  Norm Parcelonna Plyler, RD 04/17/2018 2:14 PM.   This month or next.

## 2018-05-02 ENCOUNTER — Ambulatory Visit (INDEPENDENT_AMBULATORY_CARE_PROVIDER_SITE_OTHER): Payer: BLUE CROSS/BLUE SHIELD | Admitting: Psychiatry

## 2018-05-02 DIAGNOSIS — F509 Eating disorder, unspecified: Secondary | ICD-10-CM

## 2018-05-25 ENCOUNTER — Other Ambulatory Visit: Payer: Self-pay | Admitting: Internal Medicine

## 2018-05-25 DIAGNOSIS — Z794 Long term (current) use of insulin: Principal | ICD-10-CM

## 2018-05-25 DIAGNOSIS — E113599 Type 2 diabetes mellitus with proliferative diabetic retinopathy without macular edema, unspecified eye: Secondary | ICD-10-CM

## 2018-05-25 MED ORDER — INSULIN GLARGINE 100 UNIT/ML ~~LOC~~ SOLN: SUBCUTANEOUS | 0 refills | Status: DC

## 2018-05-26 ENCOUNTER — Other Ambulatory Visit: Payer: Self-pay | Admitting: Internal Medicine

## 2018-05-26 DIAGNOSIS — Z794 Long term (current) use of insulin: Principal | ICD-10-CM

## 2018-05-26 DIAGNOSIS — E113599 Type 2 diabetes mellitus with proliferative diabetic retinopathy without macular edema, unspecified eye: Secondary | ICD-10-CM

## 2018-06-13 ENCOUNTER — Encounter: Payer: Self-pay | Admitting: *Deleted

## 2018-06-19 ENCOUNTER — Encounter: Payer: Self-pay | Admitting: Gastroenterology

## 2018-07-14 ENCOUNTER — Other Ambulatory Visit: Payer: Self-pay

## 2018-07-14 ENCOUNTER — Ambulatory Visit (INDEPENDENT_AMBULATORY_CARE_PROVIDER_SITE_OTHER): Payer: BLUE CROSS/BLUE SHIELD | Admitting: Internal Medicine

## 2018-07-14 ENCOUNTER — Encounter: Payer: Self-pay | Admitting: Internal Medicine

## 2018-07-14 VITALS — BP 138/78 | HR 97 | Temp 97.8°F | Ht 64.0 in | Wt 280.8 lb

## 2018-07-14 DIAGNOSIS — Z794 Long term (current) use of insulin: Secondary | ICD-10-CM | POA: Diagnosis not present

## 2018-07-14 DIAGNOSIS — E1140 Type 2 diabetes mellitus with diabetic neuropathy, unspecified: Secondary | ICD-10-CM

## 2018-07-14 DIAGNOSIS — Z79899 Other long term (current) drug therapy: Secondary | ICD-10-CM | POA: Diagnosis not present

## 2018-07-14 DIAGNOSIS — G629 Polyneuropathy, unspecified: Secondary | ICD-10-CM

## 2018-07-14 DIAGNOSIS — Z23 Encounter for immunization: Secondary | ICD-10-CM

## 2018-07-14 DIAGNOSIS — E11319 Type 2 diabetes mellitus with unspecified diabetic retinopathy without macular edema: Secondary | ICD-10-CM

## 2018-07-14 DIAGNOSIS — E113599 Type 2 diabetes mellitus with proliferative diabetic retinopathy without macular edema, unspecified eye: Secondary | ICD-10-CM

## 2018-07-14 MED ORDER — GABAPENTIN 100 MG PO CAPS: 300.0000 mg | ORAL_CAPSULE | Freq: Every day | ORAL | 2 refills | Status: DC

## 2018-07-14 NOTE — Assessment & Plan Note (Signed)
Received annual flu vaccine. 

## 2018-07-14 NOTE — Assessment & Plan Note (Addendum)
Assessment: Patient reports that she is still taking the Lantus 33, metformin 2007.8 mg.  Her last A1c was 7.9.  She reports that her CBGs are averaging 169 over the past 14 days.  She has been getting multiple steroid injections in her knees and her back and she reports that her CBGs at that time were elevated to around 200.  She also developed some bilateral feet pain reports started on Saturday, she reports that it is a throbbing in nature and comes and goes.  She has never had that before.  Reports that she has not had an eye exam in a while, but has never been told she has retinopathy.  Reported that she will make a follow-up with her ophthalmologist exam. She does report that she has not had her bariatric surgery yet, she has been scheduled for December 2.  Plan: -Obtain A1c -Continue Lantus 33 units, Metformin 2000, and Victoza 1.8 mg -Start gabapentin 300 mg at bedtime for likely diabetic neuropathy -Bariatric surgery scheduled December 2nd, return to clinic after this to discuss adjustment of her diabetic regimen

## 2018-07-14 NOTE — Progress Notes (Signed)
CC: Tingling in toes  HPI:  [name] is a 57 y.o.   Please see A&P for status of the patient's chronic medical conditions  Past Medical History:  Diagnosis Date  . Anal fissure   . Anxiety   . Breast cancer (HCC)   . Congenital stricture of esophagus   . Depression   . Diabetes mellitus type II   . DIABETES MELLITUS, TYPE II, UNCONTROLLED 09/01/2007   04/10/13 eye exam neg proliferative DM retinopathy, +non proliferative DM retinopathy OS and OD +amblyopia OD   . Dysmetabolic syndrome   . Gall bladder disease    removed  . GERD (gastroesophageal reflux disease)   . Headache(784.0)   . Hypertension   . Sarcoid   . Sleep apnea    Review of Systems: Refer to history of present illness and assessment and plans for pertinent review of systems, all others reviewed and negative.  Physical Exam:  Vitals:   07/14/18 1003  BP: 138/78  Pulse: 97  Temp: 97.8 F (36.6 C)  TempSrc: Oral  SpO2: 99%  Weight: 280 lb 12.8 oz (127.4 kg)  Height: 5\' 4"  (1.626 m)   Physical Exam  Constitutional: She is oriented to person, place, and time and well-developed, well-nourished, and in no distress.  HENT:  Head: Normocephalic and atraumatic.  Eyes: Pupils are equal, round, and reactive to light. Conjunctivae and EOM are normal.  Neck: Normal range of motion. Neck supple. No thyromegaly present.  Cardiovascular: Normal rate, regular rhythm and normal heart sounds. Exam reveals no gallop and no friction rub.  No murmur heard. Pulmonary/Chest: Effort normal and breath sounds normal. No respiratory distress. She has no wheezes.  Abdominal: Soft. Bowel sounds are normal. She exhibits no distension.  Musculoskeletal: Normal range of motion.  Neurological: She is alert and oriented to person, place, and time.  Sensation to pinprick intact in bilateral feet.   Skin: Skin is warm and dry. No erythema.  No wounds on feet  Psychiatric: Mood and affect normal.     Social  History   Socioeconomic History  . Marital status: Single    Spouse name: Not on file  . Number of children: Not on file  . Years of education: 13+  . Highest education level: Not on file  Occupational History  . Occupation: Coder    Comment: currently in school([employer]) for accounting. 1 year left  Social Needs  . Financial resource strain: Not on file  . Food insecurity:    Worry: Sometimes true    Inability: Sometimes true  . Transportation needs:    Medical: Not on file    Non-medical: Not on file  Tobacco Use  . Smoking status: Never Smoker  . Smokeless tobacco: Never Used  Substance and Sexual Activity  . Alcohol use: No    Alcohol/week: 0.0 standard drinks  . Drug use: No  . Sexual activity: Not on file  Lifestyle  . Physical activity:    Days per week: Not on file    Minutes per session: Not on file  . Stress: Not on file  Relationships  . Social connections:    Talks on phone: Not on file    Gets together: Not on file    Attends religious service: Not on file    Active member of club or organization: Not on file    Attends meetings of clubs or organizations: Not on file    Relationship status: Not on file  . Intimate partner violence:  Fear of current or ex partner: Not on file    Emotionally abused: Not on file    Physically abused: Not on file    Forced sexual activity: Not on file  Other Topics Concern  . Not on file  Social History Narrative  . Not on file    Family History  Problem Relation Age of Onset  . Sarcoidosis Father   . Leukemia Father   . Hypertension Mother   . Diabetes Mother   . Heart attack Mother   . Aneurysm Mother        CNS  . Hypertension Sister   . Diabetes Sister   . Sleep apnea Sister   . Asthma Other   . Cancer Other   . Heart disease Other   . Stroke Other     Assessment & Plan:   See Encounters Tab for problem based charting.  Patient seen with Dr. Heide Spark

## 2018-07-14 NOTE — Patient Instructions (Addendum)
Thank you for allowing [place] to provide your care today. Today we discussed your pain in your feet.   I have ordred TSH (thyroid level), Vitamin B12, and A1c labs for you. I will call if any are abnormal.    Today we have added gabapentin to your medications. Please take this once a day at night, you can increase this to 3 times a day.  If your symptoms do not improve or worsen we will likely get a MRI of your back to rule out any other causes of the feet pain.  Please follow-up in 1 month.    Should you have any questions or concerns please call the internal medicine clinic at [phone].   e

## 2018-07-14 NOTE — Assessment & Plan Note (Addendum)
Assessment: She reports that on Saturday when she was sitting there watching a football game she started to have bilateral feet pains, she states that all of her toes and the dorsal aspect of her foot started throbbing.  She reports that intensity changes but that has always is present.  She has tried diclofenac on it and she reports that it only mildly helped.  She denies any weakness. She denies any new exposures, chemicals or paint.  Denies any trauma in her feet or her back.  She denies any numbness or tingling in her hands.Patient has a history of well-controlled diabetes however had been having steroid injections over the past couple of months for her knees and back.  She does have a history of a ruptured disc in her back and she has been getting steroid injections in that area, her last injection was about 2 months ago.   It is possible that this is related to her diabetes causing diabetic neuropathy.  However will check other causes of neuropathy including a TSH and vitamin B12 level.  Given her history of back issues, the sudden onset of symptoms we can consider getting a lumbar MRI if her symptoms do not improve on her follow-up visit next.  Plan: -Start gabapentin 300 mg at bedtime, informed patient that she can increase this to 3 times a day if her symptoms improve -Check A1c, B12, TSH -Consider lumbar MRI symptoms do not improve with gabapentin -Return to clinic in 1 month to reassess

## 2018-07-16 LAB — VITAMIN B12: Vitamin B-12: 315 pg/mL (ref 232–1245)

## 2018-07-16 LAB — TSH: TSH: 0.198 u[IU]/mL — ABNORMAL LOW (ref 0.450–4.500)

## 2018-07-16 LAB — HEMOGLOBIN A1C
Est. average glucose Bld gHb Est-mCnc: 212 mg/dL
Hgb A1c MFr Bld: 9 % — ABNORMAL HIGH (ref 4.8–5.6)

## 2018-07-17 ENCOUNTER — Telehealth: Payer: Self-pay | Admitting: Internal Medicine

## 2018-07-17 ENCOUNTER — Other Ambulatory Visit (INDEPENDENT_AMBULATORY_CARE_PROVIDER_SITE_OTHER): Payer: BLUE CROSS/BLUE SHIELD

## 2018-07-17 DIAGNOSIS — G629 Polyneuropathy, unspecified: Secondary | ICD-10-CM

## 2018-07-17 NOTE — Telephone Encounter (Signed)
Called patient to discuss lab results.   Diabetes: Hgb A1c elevated to 9.0. Currently still on lantus 33 units qhs, metformin 2000mg , and victoza 1.8 mg daily. She had been receiving multiple steroid injections for her knees and back over the past few months. She reports that she her CBGs have been in the normal range recently. We will continue her current regimen for now since the past few months have been affected by the steroid injections.   Neuropathy: TSH was decreased to 0.198, previous TSH was normal. She reports that she is feeling very hot and she reports that she has been having anxiety attacks. She has had weight loss however she states that this is because she has been eating healthier. She denies any palpitations and reports that her appetite has been about the same. She has trouble swallowing due to her history of esophageal narrowing. She reports that the gabapentin has been working very well, states that her foot pain has improved with it. It's likely that the neuropathy is related to her poorly controlled diabetes.  -Recheck TSH and T4 -RTC within the month to discuss results  -Consider starting methimazole 15 mg daily -B12 was on the lower side but still WNL, recheck in 6 months

## 2018-07-17 NOTE — Addendum Note (Signed)
Addended by: Bufford Spikes on: 07/17/2018 11:39 AM   Modules accepted: Orders

## 2018-07-18 ENCOUNTER — Encounter: Payer: Self-pay | Admitting: Internal Medicine

## 2018-07-18 LAB — TSH: TSH: 0.129 u[IU]/mL — AB (ref 0.450–4.500)

## 2018-07-18 LAB — T4, FREE: Free T4: 0.95 ng/dL (ref 0.82–1.77)

## 2018-07-18 LAB — T3: T3, Total: 103 ng/dL (ref 71–180)

## 2018-07-20 NOTE — Telephone Encounter (Signed)
Contacted patient to inform her of the lab results and send her the letter for her bariatric surgery. Repeat TSH was .129, T4 0.95, and T3 was 103, this is likely a subclinical hyperthyroidism picture. She denied any symptoms of hyperthyroidism. She reports that her neuropathy pain has significantly improved with the gabapentin. We will likely need to evaluate the subclinical hyperthyroidism further on her next visit with a Thyroid antibody and/or radioactive iodine uptake scan.

## 2018-07-24 ENCOUNTER — Encounter: Payer: Self-pay | Admitting: Gastroenterology

## 2018-07-24 ENCOUNTER — Ambulatory Visit (AMBULATORY_SURGERY_CENTER): Payer: Self-pay | Admitting: *Deleted

## 2018-07-24 VITALS — Ht 64.0 in | Wt 278.0 lb

## 2018-07-24 DIAGNOSIS — Z1211 Encounter for screening for malignant neoplasm of colon: Secondary | ICD-10-CM

## 2018-07-24 MED ORDER — NA SULFATE-K SULFATE-MG SULF 17.5-3.13-1.6 GM/177ML PO SOLN: 1.0000 | Freq: Once | ORAL | 0 refills | Status: AC

## 2018-07-24 NOTE — Addendum Note (Signed)
Addended by: Earl Lagos on: 07/24/2018 10:37 AM   Modules accepted: Level of Service

## 2018-07-24 NOTE — Progress Notes (Signed)
Internal Medicine Clinic Attending  I saw and evaluated the patient.  I personally confirmed the key portions of the history and exam documented by Dr. Krienke and I reviewed pertinent patient test results.  The assessment, diagnosis, and plan were formulated together and I agree with the documentation in the resident's note.    

## 2018-07-24 NOTE — Progress Notes (Signed)
No egg or soy allergy known to patient  No issues with past sedation with any surgeries  or procedures, no intubation problems- pt states woke with an EGD in the past   No diet pills per patient No home 02 use per patient  No blood thinners per patient  Pt denies issues with constipation  No A fib or A flutter  EMMI video sent to pt's e mail - pt declined  suprep PNM 50 coupon to pt

## 2018-07-31 ENCOUNTER — Encounter: Payer: BLUE CROSS/BLUE SHIELD | Attending: Surgery | Admitting: Skilled Nursing Facility1

## 2018-07-31 ENCOUNTER — Other Ambulatory Visit: Payer: Self-pay

## 2018-07-31 ENCOUNTER — Encounter: Payer: Self-pay | Admitting: Skilled Nursing Facility1

## 2018-07-31 DIAGNOSIS — E1122 Type 2 diabetes mellitus with diabetic chronic kidney disease: Secondary | ICD-10-CM

## 2018-07-31 DIAGNOSIS — N182 Chronic kidney disease, stage 2 (mild): Secondary | ICD-10-CM

## 2018-07-31 NOTE — Telephone Encounter (Signed)
At this time pt is preparing for her gastric sleeve, she will only be eating 1 meal daily plus she is having a colonoscopy this week and fasting labs. She is worried about blood sugar dropping, please call her asap. Sending this note first to donnaP. And to dr Ned[facility] Ha[facility]. Sheila LeashDonna can you call her asap

## 2018-07-31 NOTE — Progress Notes (Signed)
Pre-Operative Nutrition Class:  Appt start time: 4830   End time:  1830.  Patient was seen on 07/31/2018 for Pre-Operative Bariatric Surgery Education at [facility].   Surgery date:  Surgery type: sleeve Start weight at [facility]: 280 Weight today: 277.7  Samples given per MNT protocol. Patient educated on appropriate usage: Surgery date:  Surgery type: sleeve Start weight at [facility]: 322 Weight today: 335.5  Samples given per MNT protocol. Patient educated on appropriate usage: Bariatric Advantage Multivitamin Lot # [id number] Exp: 4/21  Bariatric Advantage Calcium  Lot #  Feb-2-21 [id number]  Renee Pain Protein Powder  Shake Lot # [id number]   [id number] Exp: 03/21       March 7 20  The following the learning objectives were met by the patient during this course:  Identify Pre-Op Dietary Goals and will begin 2 weeks pre-operatively  Identify appropriate sources of fluids and proteins   State protein recommendations and appropriate sources pre and post-operatively  Identify Post-Operative Dietary Goals and will follow for 2 weeks post-operatively  Identify appropriate multivitamin and calcium sources  Describe the need for physical activity post-operatively and will follow MD recommendations  State when to call healthcare provider regarding medication questions or post-operative complications  Handouts given during class include:  Pre-Op Bariatric Surgery Diet Handout  Protein Shake Handout  Post-Op Bariatric Surgery Nutrition Handout  BELT Program Information Flyer  Support Group Information Flyer  [facility] Bariatric Supplements Price List  Follow-Up Plan: [facility] for diet advancement per MD.

## 2018-07-31 NOTE — Telephone Encounter (Signed)
Needs to speak with a nurse about meds. Please call back.  

## 2018-08-01 NOTE — Telephone Encounter (Signed)
Spoke with patient on the phone and discussed peri-operative management of her diabetes. She states she is not having any hypoglycemic episodes but she is on VLCD. Recommended reducing her long-acting insulin by 30% to 25 units. She agrees to the plan. Informed her to continue checking her sugars closely and reduce her dose further if she is getting lows.

## 2018-08-02 ENCOUNTER — Ambulatory Visit (HOSPITAL_COMMUNITY)
Admission: RE | Admit: 2018-08-02 | Discharge: 2018-08-02 | Disposition: A | Discharge status: Home / Self Care | Payer: BLUE CROSS/BLUE SHIELD | Source: Ambulatory Visit | Attending: Surgery | Admitting: Surgery

## 2018-08-02 ENCOUNTER — Other Ambulatory Visit: Payer: Self-pay

## 2018-08-02 ENCOUNTER — Encounter (HOSPITAL_COMMUNITY)
Admission: RE | Admit: 2018-08-02 | Discharge: 2018-08-02 | Disposition: A | Discharge status: Home / Self Care | Payer: BLUE CROSS/BLUE SHIELD | Source: Ambulatory Visit | Attending: Surgery | Admitting: Surgery

## 2018-08-02 ENCOUNTER — Telehealth: Payer: Self-pay | Admitting: Dietician

## 2018-08-02 ENCOUNTER — Encounter (HOSPITAL_COMMUNITY): Payer: Self-pay

## 2018-08-02 DIAGNOSIS — Z01818 Encounter for other preprocedural examination: Secondary | ICD-10-CM | POA: Diagnosis present

## 2018-08-02 DIAGNOSIS — Z6841 Body Mass Index (BMI) 40.0 and over, adult: Secondary | ICD-10-CM | POA: Insufficient documentation

## 2018-08-02 LAB — CBC
HCT: 35.8 % — ABNORMAL LOW (ref 36.0–46.0)
Hemoglobin: 12.3 g/dL (ref 12.0–15.0)
MCH: 28.6 pg (ref 26.0–34.0)
MCHC: 34.4 g/dL (ref 30.0–36.0)
MCV: 83.3 fL (ref 80.0–100.0)
NRBC: 0 % (ref 0.0–0.2)
PLATELETS: 432 10*3/uL — AB (ref 150–400)
RBC: 4.3 MIL/uL (ref 3.87–5.11)
RDW: 13.7 % (ref 11.5–15.5)
WBC: 10.7 10*3/uL — ABNORMAL HIGH (ref 4.0–10.5)

## 2018-08-02 LAB — BASIC METABOLIC PANEL
Anion gap: 10 (ref 5–15)
BUN: 27 mg/dL — ABNORMAL HIGH (ref 6–20)
CALCIUM: 9.6 mg/dL (ref 8.9–10.3)
CO2: 24 mmol/L (ref 22–32)
CREATININE: 1.09 mg/dL — AB (ref 0.44–1.00)
Chloride: 106 mmol/L (ref 98–111)
GFR, EST NON AFRICAN AMERICAN: 55 mL/min — AB (ref >60)
Glucose, Bld: 125 mg/dL — ABNORMAL HIGH (ref 70–99)
Potassium: 4.5 mmol/L (ref 3.5–5.1)
Sodium: 140 mmol/L (ref 135–145)

## 2018-08-02 LAB — GLUCOSE, CAPILLARY: GLUCOSE-CAPILLARY: 122 mg/dL — AB (ref 70–99)

## 2018-08-02 NOTE — Progress Notes (Signed)
BMP ROUTED VIA Epic TO DR Luretha MurphyMATTHEW Ferguson

## 2018-08-02 NOTE — Progress Notes (Signed)
hgba1c routed via epic to Dr Luretha Murphymatthew martin

## 2018-08-02 NOTE — Progress Notes (Signed)
Need order ; pre-op is today at 0930.

## 2018-08-02 NOTE — Progress Notes (Signed)
ekg 12-08-17 epic   hgba1c 07-14-18 epic

## 2018-08-02 NOTE — Patient Instructions (Signed)
Sheila Ferguson  08/02/2018   Your procedure is scheduled on: 08-14-18   Report to [facility] Main  Entrance    Report to admitting at 5:30AM    Call this number if you have problems the morning of surgery [phone]     Remember: Do not eat food or drink liquids :After Midnight. BRUSH YOUR TEETH MORNING OF SURGERY AND RINSE YOUR MOUTH OUT, NO CHEWING GUM CANDY OR MINTS.     Take these medicines the morning of surgery with A SIP OF WATER: amlodipine, gabapentin, metoprolol  THE MORNING OF SURGERY , ONLY TAKE 1/2 OF LANTUS INSULIN  ! DO NOT TAKE ANY ORAL DIABETIC MEDICATIONS DAY OF YOUR SURGERY!                                 You may not have any metal on your body including hair pins and              piercings  Do not wear jewelry, make-up, lotions, powders or perfumes, deodorant             Do not wear nail polish.  Do not shave  48 hours prior to surgery.               Do not bring valuables to the hospital. [facility] IS NOT             RESPONSIBLE   FOR VALUABLES.  Contacts, dentures or bridgework may not be worn into surgery.  Leave suitcase in the car. After surgery it may be brought to your room.                 Please read over the following fact sheets you were given: _____________________________________________________________________             [facility] - Preparing for Surgery Before surgery, you can play an important role.  Because skin is not sterile, your skin needs to be as free of germs as possible.  You can reduce the number of germs on your skin by washing with CHG (chlorahexidine gluconate) soap before surgery.  CHG is an antiseptic cleaner which kills germs and bonds with the skin to continue killing germs even after washing. Please DO NOT use if you have an allergy to CHG or antibacterial soaps.  If your skin becomes reddened/irritated stop using the CHG and inform your nurse when you arrive at Short Stay. Do not  shave (including legs and underarms) for at least 48 hours prior to the first CHG shower.  You may shave your face/neck. Please follow these instructions carefully:  1.  Shower with CHG Soap the night before surgery and the  morning of Surgery.  2.  If you choose to wash your hair, wash your hair first as usual with your  normal  shampoo.  3.  After you shampoo, rinse your hair and body thoroughly to remove the  shampoo.                           4.  Use CHG as you would any other liquid soap.  You can apply chg directly  to the skin and wash  Gently with a scrungie or clean washcloth.  5.  Apply the CHG Soap to your body ONLY FROM THE NECK DOWN.   Do not use on face/ open                           Wound or open sores. Avoid contact with eyes, ears mouth and genitals (private parts).                       Wash face,  Genitals (private parts) with your normal soap.             6.  Wash thoroughly, paying special attention to the area where your surgery  will be performed.  7.  Thoroughly rinse your body with warm water from the neck down.  8.  DO NOT shower/wash with your normal soap after using and rinsing off  the CHG Soap.                9.  Pat yourself dry with a clean towel.            10.  Wear clean pajamas.            11.  Place clean sheets on your bed the night of your first shower and do not  sleep with pets. Day of Surgery : Do not apply any lotions/deodorants the morning of surgery.  Please wear clean clothes to the hospital/surgery center.  FAILURE TO FOLLOW THESE INSTRUCTIONS MAY RESULT IN THE CANCELLATION OF YOUR SURGERY PATIENT SIGNATURE_________________________________  NURSE SIGNATURE__________________________________  ________________________________________________________________________

## 2018-08-07 ENCOUNTER — Ambulatory Visit (AMBULATORY_SURGERY_CENTER): Payer: BLUE CROSS/BLUE SHIELD | Admitting: Gastroenterology

## 2018-08-07 ENCOUNTER — Encounter: Payer: Self-pay | Admitting: Gastroenterology

## 2018-08-07 VITALS — BP 122/67 | HR 89 | Temp 97.5°F | Resp 26 | Ht 64.0 in | Wt 278.0 lb

## 2018-08-07 DIAGNOSIS — Z1211 Encounter for screening for malignant neoplasm of colon: Secondary | ICD-10-CM

## 2018-08-07 MED ORDER — SODIUM CHLORIDE 0.9 % IV SOLN: 500.0000 mL | Freq: Once | INTRAVENOUS | Status: DC

## 2018-08-07 NOTE — Op Note (Signed)
[facility] Patient Name: Sheila Ferguson Procedure Date: 08/07/2018 1:30 PM MRN: [id number] Endoscopist: Meryl Dare , MD Age: 57 Referring MD:  Date of Birth: May 15, 1961 Gender: Female Account #: [id number] Procedure:                Colonoscopy Indications:              Screening for colorectal malignant neoplasm Medicines:                Monitored Anesthesia Care Procedure:                Pre-Anesthesia Assessment:                           - Prior to the procedure, a History and Physical                            was performed, and patient medications and                            allergies were reviewed. The patient's tolerance of                            previous anesthesia was also reviewed. The risks                            and benefits of the procedure and the sedation                            options and risks were discussed with the patient.                            All questions were answered, and informed consent                            was obtained. Prior Anticoagulants: The patient has                            taken no previous anticoagulant or antiplatelet                            agents. ASA Grade Assessment: III - A patient with                            severe systemic disease. After reviewing the risks                            and benefits, the patient was deemed in                            satisfactory condition to undergo the procedure.                           After obtaining informed consent, the colonoscope  was passed under direct vision. Throughout the                            procedure, the patient's blood pressure, pulse, and                            oxygen saturations were monitored continuously. The                            Colonoscope was introduced through the anus and                            advanced to the the cecum, identified by                            appendiceal  orifice and ileocecal valve. The                            ileocecal valve, appendiceal orifice, and rectum                            were photographed. The quality of the bowel                            preparation was excellent. The colonoscopy was                            performed without difficulty. The patient tolerated                            the procedure well. Scope In: 1:31:34 PM Scope Out: 1:42:02 PM Scope Withdrawal Time: 0 hours 8 minutes 20 seconds  Total Procedure Duration: 0 hours 10 minutes 28 seconds  Findings:                 The perianal and digital rectal examinations were                            normal.                           Multiple medium-mouthed diverticula were found in                            the left colon. There was no evidence of                            diverticular bleeding.                           Internal hemorrhoids were found during                            retroflexion. The hemorrhoids were small and Grade  I (internal hemorrhoids that do not prolapse).                           The exam was otherwise without abnormality on                            direct and retroflexion views. Complications:            No immediate complications. Estimated blood loss:                            None. Estimated Blood Loss:     Estimated blood loss: none. Impression:               - Internal hemorrhoids.                           - Left colon diverticulosis.                           - The examination was otherwise normal on direct                            and retroflexion views.                           - No specimens collected. Recommendation:           - Repeat colonoscopy in 10 years for screening                            purposes.                           - Patient has a contact number available for                            emergencies. The signs and symptoms of potential                             delayed complications were discussed with the                            patient. Return to normal activities tomorrow.                            Written discharge instructions were provided to the                            patient.                           - High fiber diet.                           - Continue present medications. Meryl Dare, MD 08/07/2018 1:47:23 PM This report has been signed electronically.

## 2018-08-07 NOTE — Patient Instructions (Addendum)
Handouts provided:  High Fiber Diet, Hemorrhoids, and Diverticulosis  YOU HAD AN ENDOSCOPIC PROCEDURE TODAY AT THE [facility]:   Refer to the procedure report that was given to you for any specific questions about what was found during the examination.  If the procedure report does not answer your questions, please call your gastroenterologist to clarify.  If you requested that your care partner not be given the details of your procedure findings, then the procedure report has been included in a sealed envelope for you to review at your convenience later.  YOU SHOULD EXPECT: Some feelings of bloating in the abdomen. Passage of more gas than usual.  Walking can help get rid of the air that was put into your GI tract during the procedure and reduce the bloating. If you had a lower endoscopy (such as a colonoscopy or flexible sigmoidoscopy) you may notice spotting of blood in your stool or on the toilet paper. If you underwent a bowel prep for your procedure, you may not have a normal bowel movement for a few days.  Please Note:  You might notice some irritation and congestion in your nose or some drainage.  This is from the oxygen used during your procedure.  There is no need for concern and it should clear up in a day or so.  SYMPTOMS TO REPORT IMMEDIATELY:   Following lower endoscopy (colonoscopy or flexible sigmoidoscopy):  Excessive amounts of blood in the stool  Significant tenderness or worsening of abdominal pains  Swelling of the abdomen that is new, acute  Fever of 100F or higher  For urgent or emergent issues, a gastroenterologist can be reached at any hour by calling [phone].   DIET:  We do recommend a small meal at first, but then you may proceed to your regular diet.  Drink plenty of fluids but you should avoid alcoholic beverages for 24 hours.  ACTIVITY:  You should plan to take it easy for the rest of today and you should NOT DRIVE or use heavy machinery until  tomorrow (because of the sedation medicines used during the test).    FOLLOW UP: Our staff will call the number listed on your records the next business day following your procedure to check on you and address any questions or concerns that you may have regarding the information given to you following your procedure. If we do not reach you, we will leave a message.  However, if you are feeling well and you are not experiencing any problems, there is no need to return our call.  We will assume that you have returned to your regular daily activities without incident.  If any biopsies were taken you will be contacted by phone or by letter within the next 1-3 weeks.  Please call us at [phone] if you have not heard about the biopsies in 3 weeks.    SIGNATURES/CONFIDENTIALITY: You and/or your care partner have signed paperwork which will be entered into your electronic medical record.  These signatures attest to the fact that that the information above on your After Visit Summary has been reviewed and is understood.  Full responsibility of the confidentiality of this discharge information lies with you and/or your care-partner.

## 2018-08-07 NOTE — Progress Notes (Signed)
PT taken to PACU. Monitors in place. VSS. Report given to RN. 

## 2018-08-08 ENCOUNTER — Telehealth: Payer: Self-pay | Admitting: *Deleted

## 2018-08-08 NOTE — Telephone Encounter (Signed)
  Follow up Call-  Call back number 08/07/2018  Post procedure Call Back phone  # [phone]  Permission to leave phone message Yes  Some recent data might be hidden     Patient questions:  Do you have a fever, pain , or abdominal swelling? No. Pain Score  0 *  Have you tolerated food without any problems? Yes.    Have you been able to return to your normal activities? Yes.    Do you have any questions about your discharge instructions: Diet   No. Medications  No. Follow up visit  No.  Do you have questions or concerns about your Care? No.  Actions: * If pain score is 4 or above: No action needed, pain <4.

## 2018-08-09 NOTE — Progress Notes (Signed)
Patient made aware to check in at 0730 on 08-14-18 for surgery  . Npo after midnight , sips of water with meds. Patient questioned RN if she was supposed to "drink a solution for surgery" . RN checked orders and there are no orders in epic for any oral solutions. RN advised patient to contact surgeons office regarding this. Patient verbalized understanding

## 2018-08-12 NOTE — H&P (Signed)
Chief Complaint:  Morbid obesity and BMI 48  History of Present Illness:  Sheila Ferguson is an 57 y.o. female with a BMI of 48 who desires a sleeve gastrectomy.  She has a family of folks who are her support and a sister that [place].    She has been through our orientation process and she understands and accepts the risks of surgery.  Past Medical History:  Diagnosis Date  . Allergy   . Anal fissure   . Anemia   . Anxiety   . Arthritis   . Bradycardia    slow heart rate per pt - unsure of rate   . Congenital stricture of esophagus   . Depression   . Diabetes mellitus type II   . DIABETES MELLITUS, TYPE II, UNCONTROLLED 09/01/2007   04/10/13 eye exam neg proliferative DM retinopathy, +non proliferative DM retinopathy OS and OD +amblyopia OD   . Dysmetabolic syndrome   . Gall bladder disease    removed  . GERD (gastroesophageal reflux disease)   . Headache(784.0)   . Heart murmur   . Hemorrhoids   . Hypertension   . Neuromuscular disorder (HCC)    ruptured disc in back, burcitis   . Sarcoid   . Sleep apnea    no cpap   . Thyroid disease    no meds needed at this time     Past Surgical History:  Procedure Laterality Date  . ABDOMINAL HYSTERECTOMY    . CHOLECYSTECTOMY    . COLONOSCOPY  2008   nesxt is 08-07-18  . ESOPHAGEAL DILATION     x2  . OOPHORECTOMY     right, due to endometriosis  . UPPER GASTROINTESTINAL ENDOSCOPY      No current facility-administered medications for this encounter.    Current Outpatient Medications  Medication Sig Dispense Refill  . acetaminophen (TYLENOL) 500 MG tablet Take 2 tablets (1,000 mg total) by mouth 3 (three) times daily. (Patient taking differently: Take 1,000 mg by mouth every 8 (eight) hours as needed for mild pain or headache. ) 90 tablet 1  . amLODipine (NORVASC) 10 MG tablet Take 1 tablet (10 mg total) by mouth daily. (Patient taking differently: Take 5 mg by mouth daily. ) 90 tablet  3  . aspirin 325 MG tablet Take 325 mg by mouth daily.    . Calcium Carb-Cholecalciferol (CALCIUM 600 + D PO) Take 1 tablet by mouth daily.     . citalopram (CELEXA) 40 MG tablet TAKE 1 TABLET BY MOUTH ONCE DAILY (Patient taking differently: Take 40 mg by mouth at bedtime. ) 90 tablet 1  . diclofenac sodium (VOLTAREN) 1 % GEL Apply 2 g topically 4 (four) times daily. (Patient taking differently: Apply 2 g topically 4 (four) times daily as needed (for pain). ) 100 g 0  . diphenhydrAMINE (BENADRYL) 25 MG tablet Take 1 tablet (25 mg total) by mouth every 6 (six) hours for 4 doses. (Patient taking differently: Take 25 mg by mouth daily as needed for allergies. ) 4 tablet 0  . EPINEPHrine 0.3 mg/0.3 mL IJ SOAJ injection Inject 0.3 mLs (0.3 mg total) into the muscle as needed (for anaphylaxis). 2 Device 0  . gabapentin (NEURONTIN) 100 MG capsule Take 3 capsules (300 mg total) by mouth at bedtime. (Patient taking differently: Take 100 mg by mouth at bedtime. ) 90 capsule 2  . hydrocortisone (ANUSOL-HC) 25 MG suppository Place 1 suppository (25 mg total) rectally 2 (two) times daily. (  Patient taking differently: Place 25 mg rectally 2 (two) times daily as needed for hemorrhoids. ) 7 suppository 1  . insulin glargine (LANTUS) 100 UNIT/ML injection INJECT 35 UNITS SUBCUTANEOUSLY ONCE DAILY (Patient taking differently: Inject 33 Units into the skin daily. ) 30 mL 0  . lisinopril (PRINIVIL,ZESTRIL) 40 MG tablet TAKE 1 TABLET BY MOUTH ONCE DAILY (Patient taking differently: Take 40 mg by mouth daily. ) 90 tablet 3  . metFORMIN (GLUCOPHAGE XR) 500 MG 24 hr tablet Take 4 tablets (2,000 mg total) by mouth daily with breakfast. (Patient taking differently: Take 500-1,500 mg by mouth See admin instructions. Take 1500 mg by mouth in the morning and take 500 mg by mouth at bedtime) 360 tablet 3  . metoprolol tartrate (LOPRESSOR) 100 MG tablet Take 1 tablet (100 mg total) by mouth 2 (two) times daily. 180 tablet 3  .  Multiple Vitamin (MULTIVITAMIN) tablet Take 1 tablet by mouth daily.    Marland Kitchen omeprazole (PRILOSEC) 40 MG capsule Take 1 capsule (40 mg total) by mouth daily. 90 capsule 1  . sitaGLIPtin (JANUVIA) 100 MG tablet Take 100 mg by mouth at bedtime.     Marland Kitchen VICTOZA 18 MG/3ML SOPN INJECT 0.3 ML (1.8 MG) INTO THE SKIN DAILY (Patient taking differently: Inject 1.8 mg into the skin daily. ) 12 pen 2  . BAYER MICROLET LANCETS lancets Check blood sugar up to 5 times daily 100 each 12  . Continuous Blood Gluc Receiver (FREESTYLE LIBRE READER) DEVI 1 each by Does not apply route 4 (four) times daily. 1 Device 0  . Continuous Blood Gluc Sensor (FREESTYLE LIBRE SENSOR SYSTEM) MISC Use to check blood sugar at least 4 times daily 3 each 12  . dextrose (RELION GLUCOSE) 40 % GEL Take 37.5 g by mouth once as needed for low blood sugar. 2 Tube 5  . glucose blood (BAYER CONTOUR NEXT TEST) test strip Use to check blood sugar up to 4 times a day before meals and bedtime and anytime you feel like you are having a low blood sugar 150 each 12  . Insulin Syringe-Needle U-100 31G X 15/64" 0.5 ML MISC Use to inject insulin two times a day 100 each 8  . loratadine (CLARITIN) 10 MG tablet TAKE ONE TABLET BY MOUTH ONCE DAILY AS NEEDED FOR ALLERGIES 90 tablet 1  . lovastatin (MEVACOR) 20 MG tablet Take 1 tablet (20 mg total) by mouth at bedtime. 90 tablet 3   Ceftin [cefuroxime axetil]; Shrimp [shellfish allergy]; Pineapple; Adhesive [tape]; and Azithromycin Family History  Problem Relation Age of Onset  . Sarcoidosis Father   . Leukemia Father   . Hypertension Mother   . Diabetes Mother   . Heart attack Mother   . Aneurysm Mother        CNS  . Breast cancer Mother   . Hypertension Sister   . Diabetes Sister   . Sleep apnea Sister   . Asthma Other   . Cancer Other   . Heart disease Other   . Stroke Other   . Colon cancer Neg Hx   . Colon polyps Neg Hx   . Esophageal cancer Neg Hx   . Rectal cancer Neg Hx   . Stomach  cancer Neg Hx    Social History:   reports that she has never smoked. She has never used smokeless tobacco. She reports that she does not drink alcohol or use drugs.   REVIEW OF SYSTEMS : Negative except for see problem list  Physical  Exam:   There were no vitals taken for this visit. There is no height or weight on file to calculate BMI.  Gen:  WDWN AAF NAD  Neurological: Alert and oriented to person, place, and time. Motor and sensory function is grossly intact  Head: Normocephalic and atraumatic.  Eyes: Conjunctivae are normal. Pupils are equal, round, and reactive to light. No scleral icterus.  Neck: Normal range of motion. Neck supple. No tracheal deviation or thyromegaly present.  Cardiovascular:  SR without murmurs or gallops.  No carotid bruits Breast:  Not examined Respiratory: Effort normal.  No respiratory distress. No chest wall tenderness. Breath sounds normal.  No wheezes, rales or rhonchi.  Abdomen:  Obese without masses GU:  unremarkable Musculoskeletal: Normal range of motion. Extremities are nontender. No cyanosis, edema or clubbing noted Lymphadenopathy: No cervical, preauricular, postauricular or axillary adenopathy is present Skin: Skin is warm and dry. No rash noted. No diaphoresis. No erythema. No pallor. Pscyh: Normal mood and affect. Behavior is normal. Judgment and thought content normal.   LABORATORY RESULTS: No results found for this or any previous visit (from the past 48 hour(s)).   RADIOLOGY RESULTS: No results found.  Problem List: Patient Active Problem List   Diagnosis Date Noted  . Neuropathy 07/14/2018  . 'light-for-dates' infant with signs of fetal malnutrition 03/24/2018  . Need for immunization against influenza 05/24/2017  . Immunity status testing 04/01/2017  . Acute bilateral back pain 11/29/2016  . Severe obesity (BMI >= 40) (HCC) 10/22/2016  . Lumbar radiculopathy, chronic 05/06/2016  . Bilateral thigh pain 10/07/2015  . CKD  stage 2 due to type 2 diabetes mellitus (HCC) 09/04/2014  . Health care maintenance 10/05/2013  . Osteoarthritis of both knees 10/05/2013  . Hyperlipidemia 12/25/2012  . Anxiety and depression 12/28/2011  . MIGRAINE HEADACHE 01/13/2008  . GERD 01/13/2008  . Essential hypertension 02/09/2007  . Diabetes mellitus type 2 with retinopathy (HCC) 08/31/2004    Assessment & Plan: Morbid obesity with BMI 48 for sleeve gastrectomy    Matt B. Daphine Deutscher, MD, [facility] [phone] beeper [phone]  08/12/2018 9:15 AM

## 2018-08-13 MED ORDER — BUPIVACAINE LIPOSOME 1.3 % IJ SUSP
20.0000 mL | INTRAMUSCULAR | Status: DC
  Filled 2018-08-13: qty 20

## 2018-08-14 ENCOUNTER — Other Ambulatory Visit: Payer: Self-pay

## 2018-08-14 ENCOUNTER — Encounter (HOSPITAL_COMMUNITY)
Admission: RE | Disposition: A | Discharge status: Home / Self Care | Payer: Self-pay | Source: Ambulatory Visit | Attending: Surgery

## 2018-08-14 ENCOUNTER — Inpatient Hospital Stay (HOSPITAL_COMMUNITY)
Admission: RE | Admit: 2018-08-14 | Discharge: 2018-08-15 | DRG: 621 | Disposition: A | Discharge status: Home / Self Care | Payer: BLUE CROSS/BLUE SHIELD | Source: Ambulatory Visit | Attending: Surgery | Admitting: Surgery

## 2018-08-14 ENCOUNTER — Encounter (HOSPITAL_COMMUNITY): Payer: Self-pay

## 2018-08-14 ENCOUNTER — Inpatient Hospital Stay (HOSPITAL_COMMUNITY): Payer: BLUE CROSS/BLUE SHIELD | Admitting: Anesthesiology

## 2018-08-14 DIAGNOSIS — Z7982 Long term (current) use of aspirin: Secondary | ICD-10-CM | POA: Diagnosis not present

## 2018-08-14 DIAGNOSIS — Z91048 Other nonmedicinal substance allergy status: Secondary | ICD-10-CM

## 2018-08-14 DIAGNOSIS — K219 Gastro-esophageal reflux disease without esophagitis: Secondary | ICD-10-CM | POA: Diagnosis present

## 2018-08-14 DIAGNOSIS — E785 Hyperlipidemia, unspecified: Secondary | ICD-10-CM | POA: Diagnosis present

## 2018-08-14 DIAGNOSIS — Z79899 Other long term (current) drug therapy: Secondary | ICD-10-CM

## 2018-08-14 DIAGNOSIS — Z91013 Allergy to seafood: Secondary | ICD-10-CM | POA: Diagnosis not present

## 2018-08-14 DIAGNOSIS — F329 Major depressive disorder, single episode, unspecified: Secondary | ICD-10-CM | POA: Diagnosis present

## 2018-08-14 DIAGNOSIS — E1122 Type 2 diabetes mellitus with diabetic chronic kidney disease: Secondary | ICD-10-CM | POA: Diagnosis present

## 2018-08-14 DIAGNOSIS — Z794 Long term (current) use of insulin: Secondary | ICD-10-CM

## 2018-08-14 DIAGNOSIS — I129 Hypertensive chronic kidney disease with stage 1 through stage 4 chronic kidney disease, or unspecified chronic kidney disease: Secondary | ICD-10-CM | POA: Diagnosis present

## 2018-08-14 DIAGNOSIS — Z91018 Allergy to other foods: Secondary | ICD-10-CM

## 2018-08-14 DIAGNOSIS — E11319 Type 2 diabetes mellitus with unspecified diabetic retinopathy without macular edema: Secondary | ICD-10-CM | POA: Diagnosis present

## 2018-08-14 DIAGNOSIS — Z6841 Body Mass Index (BMI) 40.0 and over, adult: Secondary | ICD-10-CM | POA: Diagnosis not present

## 2018-08-14 DIAGNOSIS — Z881 Allergy status to other antibiotic agents status: Secondary | ICD-10-CM

## 2018-08-14 DIAGNOSIS — F419 Anxiety disorder, unspecified: Secondary | ICD-10-CM | POA: Diagnosis present

## 2018-08-14 DIAGNOSIS — N182 Chronic kidney disease, stage 2 (mild): Secondary | ICD-10-CM | POA: Diagnosis present

## 2018-08-14 DIAGNOSIS — Z9884 Bariatric surgery status: Secondary | ICD-10-CM

## 2018-08-14 HISTORY — PX: LAPAROSCOPIC GASTRIC SLEEVE RESECTION: SHX5895

## 2018-08-14 LAB — COMPREHENSIVE METABOLIC PANEL
ALT: 26 U/L (ref 0–44)
AST: 22 U/L (ref 15–41)
Albumin: 4 g/dL (ref 3.5–5.0)
Alkaline Phosphatase: 92 U/L (ref 38–126)
Anion gap: 12 (ref 5–15)
BUN: 33 mg/dL — ABNORMAL HIGH (ref 6–20)
CHLORIDE: 108 mmol/L (ref 98–111)
CO2: 21 mmol/L — ABNORMAL LOW (ref 22–32)
Calcium: 9.9 mg/dL (ref 8.9–10.3)
Creatinine, Ser: 1.34 mg/dL — ABNORMAL HIGH (ref 0.44–1.00)
GFR calc Af Amer: 51 mL/min — ABNORMAL LOW (ref >60)
GFR calc non Af Amer: 44 mL/min — ABNORMAL LOW (ref >60)
Glucose, Bld: 114 mg/dL — ABNORMAL HIGH (ref 70–99)
POTASSIUM: 3.9 mmol/L (ref 3.5–5.1)
Sodium: 141 mmol/L (ref 135–145)
Total Bilirubin: 0.6 mg/dL (ref 0.3–1.2)
Total Protein: 8 g/dL (ref 6.5–8.1)

## 2018-08-14 LAB — CBC WITH DIFFERENTIAL/PLATELET
Abs Immature Granulocytes: 0.03 10*3/uL (ref 0.00–0.07)
Basophils Absolute: 0 10*3/uL (ref 0.0–0.1)
Basophils Relative: 0 %
Eosinophils Absolute: 0.2 10*3/uL (ref 0.0–0.5)
Eosinophils Relative: 2 %
HCT: 34.9 % — ABNORMAL LOW (ref 36.0–46.0)
Hemoglobin: 12.1 g/dL (ref 12.0–15.0)
Immature Granulocytes: 0 %
Lymphocytes Relative: 33 %
Lymphs Abs: 3.3 10*3/uL (ref 0.7–4.0)
MCH: 28.2 pg (ref 26.0–34.0)
MCHC: 34.7 g/dL (ref 30.0–36.0)
MCV: 81.4 fL (ref 80.0–100.0)
Monocytes Absolute: 0.8 10*3/uL (ref 0.1–1.0)
Monocytes Relative: 8 %
Neutro Abs: 5.6 10*3/uL (ref 1.7–7.7)
Neutrophils Relative %: 57 %
Platelets: 415 10*3/uL — ABNORMAL HIGH (ref 150–400)
RBC: 4.29 MIL/uL (ref 3.87–5.11)
RDW: 13.3 % (ref 11.5–15.5)
WBC: 10 10*3/uL (ref 4.0–10.5)
nRBC: 0 % (ref 0.0–0.2)

## 2018-08-14 LAB — CREATININE, SERUM
Creatinine, Ser: 1.39 mg/dL — ABNORMAL HIGH (ref 0.44–1.00)
GFR calc Af Amer: 49 mL/min — ABNORMAL LOW (ref >60)
GFR calc non Af Amer: 42 mL/min — ABNORMAL LOW (ref >60)

## 2018-08-14 LAB — GLUCOSE, CAPILLARY
GLUCOSE-CAPILLARY: 166 mg/dL — AB (ref 70–99)
Glucose-Capillary: 118 mg/dL — ABNORMAL HIGH (ref 70–99)
Glucose-Capillary: 214 mg/dL — ABNORMAL HIGH (ref 70–99)
Glucose-Capillary: 175 mg/dL — ABNORMAL HIGH (ref 70–99)
Glucose-Capillary: 187 mg/dL — ABNORMAL HIGH (ref 70–99)

## 2018-08-14 LAB — TYPE AND SCREEN
ABO/RH(D): O POS
Antibody Screen: NEGATIVE

## 2018-08-14 LAB — CBC
HEMATOCRIT: 36 % (ref 36.0–46.0)
Hemoglobin: 12 g/dL (ref 12.0–15.0)
MCH: 27.8 pg (ref 26.0–34.0)
MCHC: 33.3 g/dL (ref 30.0–36.0)
MCV: 83.5 fL (ref 80.0–100.0)
Platelets: 349 10*3/uL (ref 150–400)
RBC: 4.31 MIL/uL (ref 3.87–5.11)
RDW: 13.2 % (ref 11.5–15.5)
WBC: 14.4 10*3/uL — ABNORMAL HIGH (ref 4.0–10.5)
nRBC: 0 % (ref 0.0–0.2)

## 2018-08-14 LAB — ABO/RH: ABO/RH(D): O POS

## 2018-08-14 SURGERY — GASTRECTOMY, SLEEVE, LAPAROSCOPIC
Anesthesia: General

## 2018-08-14 MED ORDER — MORPHINE SULFATE (PF) 4 MG/ML IV SOLN: 1.0000 mg | INTRAVENOUS | Status: DC | PRN

## 2018-08-14 MED ORDER — HEPARIN SODIUM (PORCINE) 5000 UNIT/ML IJ SOLN
5000.0000 [IU] | INTRAMUSCULAR | Status: AC
  Administered 2018-08-14: 5000 [IU] via SUBCUTANEOUS
  Filled 2018-08-14: qty 1

## 2018-08-14 MED ORDER — LACTATED RINGERS IV SOLN
INTRAVENOUS | Status: DC
  Administered 2018-08-14: 08:00:00 via INTRAVENOUS

## 2018-08-14 MED ORDER — PNEUMOCOCCAL VAC POLYVALENT 25 MCG/0.5ML IJ INJ
0.5000 mL | INJECTION | INTRAMUSCULAR | Status: DC
  Filled 2018-08-14: qty 0.5

## 2018-08-14 MED ORDER — ACETAMINOPHEN 160 MG/5ML PO SOLN
650.0000 mg | Freq: Four times a day (QID) | ORAL | Status: DC
  Administered 2018-08-14 – 2018-08-15 (×4): 650 mg via ORAL
  Filled 2018-08-14 (×4): qty 20.3

## 2018-08-14 MED ORDER — PANTOPRAZOLE SODIUM 40 MG IV SOLR
40.0000 mg | Freq: Every day | INTRAVENOUS | Status: DC
  Administered 2018-08-14: 40 mg via INTRAVENOUS
  Filled 2018-08-14: qty 40

## 2018-08-14 MED ORDER — ONDANSETRON HCL 4 MG/2ML IJ SOLN
INTRAMUSCULAR | Status: DC | PRN
  Administered 2018-08-14: 4 mg via INTRAVENOUS

## 2018-08-14 MED ORDER — OXYCODONE HCL 5 MG PO TABS: 5.0000 mg | ORAL_TABLET | Freq: Once | ORAL | Status: DC | PRN

## 2018-08-14 MED ORDER — GLUCAGON HCL RDNA (DIAGNOSTIC) 1 MG IJ SOLR
INTRAMUSCULAR | Status: DC | PRN
  Administered 2018-08-14: 1 mg via INTRAVENOUS

## 2018-08-14 MED ORDER — SODIUM CHLORIDE 0.9 % IV SOLN
INTRAVENOUS | Status: DC | PRN
  Administered 2018-08-14: 50 ug/min via INTRAVENOUS

## 2018-08-14 MED ORDER — KETOROLAC TROMETHAMINE 30 MG/ML IJ SOLN
INTRAMUSCULAR | Status: AC
  Filled 2018-08-14: qty 1

## 2018-08-14 MED ORDER — MIDAZOLAM HCL 2 MG/2ML IJ SOLN
INTRAMUSCULAR | Status: AC
  Filled 2018-08-14: qty 2

## 2018-08-14 MED ORDER — FENTANYL CITRATE (PF) 250 MCG/5ML IJ SOLN
INTRAMUSCULAR | Status: AC
  Filled 2018-08-14: qty 5

## 2018-08-14 MED ORDER — MIDAZOLAM HCL 5 MG/5ML IJ SOLN
INTRAMUSCULAR | Status: DC | PRN
  Administered 2018-08-14: 2 mg via INTRAVENOUS

## 2018-08-14 MED ORDER — ROCURONIUM BROMIDE 100 MG/10ML IV SOLN
INTRAVENOUS | Status: AC
  Filled 2018-08-14: qty 1

## 2018-08-14 MED ORDER — HEPARIN SODIUM (PORCINE) 5000 UNIT/ML IJ SOLN
5000.0000 [IU] | Freq: Three times a day (TID) | INTRAMUSCULAR | Status: DC
  Administered 2018-08-14 – 2018-08-15 (×2): 5000 [IU] via SUBCUTANEOUS
  Filled 2018-08-14 (×2): qty 1

## 2018-08-14 MED ORDER — PROPOFOL 10 MG/ML IV BOLUS
INTRAVENOUS | Status: DC | PRN
  Administered 2018-08-14: 200 mg via INTRAVENOUS

## 2018-08-14 MED ORDER — APREPITANT 40 MG PO CAPS
40.0000 mg | ORAL_CAPSULE | ORAL | Status: AC
  Administered 2018-08-14: 40 mg via ORAL
  Filled 2018-08-14: qty 1

## 2018-08-14 MED ORDER — LACTATED RINGERS IR SOLN
Status: DC | PRN
  Administered 2018-08-14: 1

## 2018-08-14 MED ORDER — LIDOCAINE 2% (20 MG/ML) 5 ML SYRINGE
INTRAMUSCULAR | Status: DC | PRN
  Administered 2018-08-14: 1.5 mg/kg/h via INTRAVENOUS

## 2018-08-14 MED ORDER — OXYCODONE HCL 5 MG/5ML PO SOLN: 5.0000 mg | Freq: Once | ORAL | Status: DC | PRN

## 2018-08-14 MED ORDER — GLUCAGON HCL RDNA (DIAGNOSTIC) 1 MG IJ SOLR
INTRAMUSCULAR | Status: AC
  Filled 2018-08-14: qty 1

## 2018-08-14 MED ORDER — SCOPOLAMINE 1 MG/3DAYS TD PT72
1.0000 | MEDICATED_PATCH | TRANSDERMAL | Status: DC
  Administered 2018-08-14: 1.5 mg via TRANSDERMAL
  Filled 2018-08-14: qty 1

## 2018-08-14 MED ORDER — KETOROLAC TROMETHAMINE 30 MG/ML IJ SOLN
30.0000 mg | Freq: Once | INTRAMUSCULAR | Status: AC | PRN
  Administered 2018-08-14: 30 mg via INTRAVENOUS

## 2018-08-14 MED ORDER — ACETAMINOPHEN 500 MG PO TABS
1000.0000 mg | ORAL_TABLET | ORAL | Status: AC
  Administered 2018-08-14: 1000 mg via ORAL
  Filled 2018-08-14: qty 2

## 2018-08-14 MED ORDER — FENTANYL CITRATE (PF) 250 MCG/5ML IJ SOLN
INTRAMUSCULAR | Status: DC | PRN
  Administered 2018-08-14: 50 ug via INTRAVENOUS
  Administered 2018-08-14: 100 ug via INTRAVENOUS
  Administered 2018-08-14 (×2): 50 ug via INTRAVENOUS

## 2018-08-14 MED ORDER — INSULIN ASPART 100 UNIT/ML ~~LOC~~ SOLN
0.0000 [IU] | SUBCUTANEOUS | Status: DC
  Administered 2018-08-14 – 2018-08-15 (×6): 4 [IU] via SUBCUTANEOUS

## 2018-08-14 MED ORDER — PHENYLEPHRINE 40 MCG/ML (10ML) SYRINGE FOR IV PUSH (FOR BLOOD PRESSURE SUPPORT)
PREFILLED_SYRINGE | INTRAVENOUS | Status: DC | PRN
  Administered 2018-08-14: 80 ug via INTRAVENOUS
  Administered 2018-08-14: 120 ug via INTRAVENOUS
  Administered 2018-08-14: 80 ug via INTRAVENOUS

## 2018-08-14 MED ORDER — LIDOCAINE HCL 2 % IJ SOLN
INTRAMUSCULAR | Status: AC
  Filled 2018-08-14: qty 20

## 2018-08-14 MED ORDER — PROMETHAZINE HCL 25 MG/ML IJ SOLN: 6.2500 mg | INTRAMUSCULAR | Status: DC | PRN

## 2018-08-14 MED ORDER — CHLORHEXIDINE GLUCONATE CLOTH 2 % EX PADS: 6.0000 | MEDICATED_PAD | Freq: Once | CUTANEOUS | Status: DC

## 2018-08-14 MED ORDER — KCL IN DEXTROSE-NACL 20-5-0.45 MEQ/L-%-% IV SOLN
INTRAVENOUS | Status: DC
  Administered 2018-08-14 – 2018-08-15 (×2): via INTRAVENOUS
  Filled 2018-08-14 (×2): qty 1000

## 2018-08-14 MED ORDER — PREMIER PROTEIN SHAKE
2.0000 [oz_av] | ORAL | Status: DC
  Administered 2018-08-15: 2 [oz_av] via ORAL

## 2018-08-14 MED ORDER — FENTANYL CITRATE (PF) 100 MCG/2ML IJ SOLN
INTRAMUSCULAR | Status: AC
  Filled 2018-08-14: qty 4

## 2018-08-14 MED ORDER — SUGAMMADEX SODIUM 200 MG/2ML IV SOLN
INTRAVENOUS | Status: DC | PRN
  Administered 2018-08-14: 400 mg via INTRAVENOUS

## 2018-08-14 MED ORDER — LIDOCAINE 2% (20 MG/ML) 5 ML SYRINGE
INTRAMUSCULAR | Status: DC | PRN
  Administered 2018-08-14: 80 mg via INTRAVENOUS

## 2018-08-14 MED ORDER — SODIUM CHLORIDE 0.9 % IV SOLN
2.0000 g | INTRAVENOUS | Status: AC
  Administered 2018-08-14: 2 g via INTRAVENOUS
  Filled 2018-08-14: qty 2

## 2018-08-14 MED ORDER — BUPIVACAINE LIPOSOME 1.3 % IJ SUSP
INTRAMUSCULAR | Status: DC | PRN
  Administered 2018-08-14: 20 mL

## 2018-08-14 MED ORDER — DEXAMETHASONE SODIUM PHOSPHATE 10 MG/ML IJ SOLN
INTRAMUSCULAR | Status: DC | PRN
  Administered 2018-08-14: 10 mg via INTRAVENOUS

## 2018-08-14 MED ORDER — FENTANYL CITRATE (PF) 100 MCG/2ML IJ SOLN
25.0000 ug | INTRAMUSCULAR | Status: DC | PRN
  Administered 2018-08-14 (×2): 50 ug via INTRAVENOUS

## 2018-08-14 MED ORDER — OXYCODONE HCL 5 MG/5ML PO SOLN
5.0000 mg | ORAL | Status: DC | PRN
  Administered 2018-08-15 (×3): 10 mg via ORAL
  Filled 2018-08-14 (×3): qty 10

## 2018-08-14 MED ORDER — 0.9 % SODIUM CHLORIDE (POUR BTL) OPTIME
TOPICAL | Status: DC | PRN
  Administered 2018-08-14: 1000 mL

## 2018-08-14 MED ORDER — GABAPENTIN 300 MG PO CAPS
300.0000 mg | ORAL_CAPSULE | ORAL | Status: AC
  Administered 2018-08-14: 300 mg via ORAL
  Filled 2018-08-14: qty 1

## 2018-08-14 MED ORDER — PROPOFOL 10 MG/ML IV BOLUS
INTRAVENOUS | Status: AC
  Filled 2018-08-14: qty 20

## 2018-08-14 MED ORDER — LIDOCAINE 2% (20 MG/ML) 5 ML SYRINGE
INTRAMUSCULAR | Status: AC
  Filled 2018-08-14: qty 5

## 2018-08-14 MED ORDER — ROCURONIUM BROMIDE 100 MG/10ML IV SOLN
INTRAVENOUS | Status: DC | PRN
  Administered 2018-08-14: 50 mg via INTRAVENOUS
  Administered 2018-08-14 (×2): 10 mg via INTRAVENOUS

## 2018-08-14 MED ORDER — ONDANSETRON HCL 4 MG/2ML IJ SOLN: 4.0000 mg | INTRAMUSCULAR | Status: DC | PRN

## 2018-08-14 MED ORDER — SUGAMMADEX SODIUM 500 MG/5ML IV SOLN
INTRAVENOUS | Status: AC
  Filled 2018-08-14: qty 5

## 2018-08-14 SURGICAL SUPPLY — 64 items
ADH SKN CLS APL DERMABOND .7 (GAUZE/BANDAGES/DRESSINGS) ×1
APL SWBSTK 6 STRL LF DISP (MISCELLANEOUS)
APPLICATOR COTTON TIP 6 STRL (MISCELLANEOUS) IMPLANT
APPLICATOR COTTON TIP 6IN STRL (MISCELLANEOUS)
APPLIER CLIP 5 13 M/L LIGAMAX5 (MISCELLANEOUS)
APPLIER CLIP ROT 10 11.4 M/L (STAPLE)
APPLIER CLIP ROT 13.4 12 LRG (CLIP)
APR CLP LRG 13.4X12 ROT 20 MLT (CLIP)
APR CLP MED LRG 11.4X10 (STAPLE)
APR CLP MED LRG 5 ANG JAW (MISCELLANEOUS)
BLADE SURG 15 STRL LF DISP TIS (BLADE) ×1 IMPLANT
BLADE SURG 15 STRL SS (BLADE) ×2
CABLE HIGH FREQUENCY MONO STRZ (ELECTRODE) ×2 IMPLANT
CLIP APPLIE 5 13 M/L LIGAMAX5 (MISCELLANEOUS) IMPLANT
CLIP APPLIE ROT 10 11.4 M/L (STAPLE) IMPLANT
CLIP APPLIE ROT 13.4 12 LRG (CLIP) IMPLANT
COVER WAND RF STERILE (DRAPES) IMPLANT
DERMABOND ADVANCED (GAUZE/BANDAGES/DRESSINGS) ×1
DERMABOND ADVANCED .7 DNX12 (GAUZE/BANDAGES/DRESSINGS) IMPLANT
DEVICE SUT QUICK LOAD TK 5 (STAPLE) IMPLANT
DEVICE SUT TI-KNOT TK 5X26 (MISCELLANEOUS) IMPLANT
DEVICE SUTURE ENDOST 10MM (ENDOMECHANICALS) IMPLANT
DISSECTOR BLUNT TIP ENDO 5MM (MISCELLANEOUS) IMPLANT
ELECT REM PT RETURN 15FT ADLT (MISCELLANEOUS) ×2 IMPLANT
GAUZE SPONGE 4X4 12PLY STRL (GAUZE/BANDAGES/DRESSINGS) IMPLANT
GLOVE BIOGEL M 8.0 STRL (GLOVE) ×2 IMPLANT
GOWN STRL REUS W/TWL XL LVL3 (GOWN DISPOSABLE) ×8 IMPLANT
GRASPER SUT TROCAR 14GX15 (MISCELLANEOUS) ×2 IMPLANT
HANDLE STAPLE EGIA 4 XL (STAPLE) ×2 IMPLANT
HOVERMATT SINGLE USE (MISCELLANEOUS) ×2 IMPLANT
KIT BASIN OR (CUSTOM PROCEDURE TRAY) ×2 IMPLANT
MARKER SKIN DUAL TIP RULER LAB (MISCELLANEOUS) ×2 IMPLANT
NDL SPNL 22GX3.5 QUINCKE BK (NEEDLE) ×1 IMPLANT
NEEDLE SPNL 22GX3.5 QUINCKE BK (NEEDLE) ×2 IMPLANT
PACK UNIVERSAL I (CUSTOM PROCEDURE TRAY) ×2 IMPLANT
RELOAD STAPLE 45 PURP MED/THCK (STAPLE) IMPLANT
RELOAD TRI 45 ART MED THCK BLK (STAPLE) ×2 IMPLANT
RELOAD TRI 45 ART MED THCK PUR (STAPLE) ×2 IMPLANT
RELOAD TRI 60 ART MED THCK BLK (STAPLE) ×2 IMPLANT
RELOAD TRI 60 ART MED THCK PUR (STAPLE) ×2 IMPLANT
SCISSORS LAP 5X45 EPIX DISP (ENDOMECHANICALS) IMPLANT
SET IRRIG TUBING LAPAROSCOPIC (IRRIGATION / IRRIGATOR) ×2 IMPLANT
SHEARS HARMONIC ACE PLUS 45CM (MISCELLANEOUS) ×2 IMPLANT
SLEEVE ADV FIXATION 5X100MM (TROCAR) ×4 IMPLANT
SLEEVE GASTRECTOMY 36FR VISIGI (MISCELLANEOUS) ×2 IMPLANT
SOLUTION ANTI FOG 6CC (MISCELLANEOUS) ×2 IMPLANT
SPONGE LAP 18X18 RF (DISPOSABLE) ×2 IMPLANT
STAPLER VISISTAT 35W (STAPLE) ×2 IMPLANT
SUT MNCRL AB 4-0 PS2 18 (SUTURE) ×4 IMPLANT
SUT SURGIDAC NAB ES-9 0 48 120 (SUTURE) IMPLANT
SUT VICRYL 0 TIES 12 18 (SUTURE) ×2 IMPLANT
SYR 10ML ECCENTRIC (SYRINGE) ×2 IMPLANT
SYR 20CC LL (SYRINGE) ×2 IMPLANT
SYR 50ML LL SCALE MARK (SYRINGE) ×2 IMPLANT
TOWEL OR 17X26 10 PK STRL BLUE (TOWEL DISPOSABLE) ×4 IMPLANT
TOWEL OR NON WOVEN STRL DISP B (DISPOSABLE) ×2 IMPLANT
TRAY FOLEY MTR SLVR 16FR STAT (SET/KITS/TRAYS/PACK) IMPLANT
TROCAR ADV FIXATION 5X100MM (TROCAR) ×2 IMPLANT
TROCAR BLADELESS 15MM (ENDOMECHANICALS) ×2 IMPLANT
TROCAR BLADELESS OPT 5 100 (ENDOMECHANICALS) ×2 IMPLANT
TUBE CALIBRATION LAPBAND (TUBING) ×1 IMPLANT
TUBING CONNECTING 10 (TUBING) ×2 IMPLANT
TUBING ENDO SMARTCAP (MISCELLANEOUS) ×2 IMPLANT
TUBING INSUF HEATED (TUBING) ×2 IMPLANT

## 2018-08-14 NOTE — Progress Notes (Signed)
Started ice and water. 

## 2018-08-14 NOTE — Progress Notes (Signed)
PHARMACY CONSULT FOR:  Risk Assessment for Post-Discharge VTE Following Bariatric Surgery  Post-Discharge VTE Risk Assessment: This patient's probability of 30-day post-discharge VTE is increased due to the factors marked:   Female    Age >/=60 years    BMI >/=50 kg/m2    CHF    Dyspnea at Rest    Paraplegia  X  Non-gastric-band surgery    Operation Time >/=3 hr    Return to OR     Length of Stay >/= 3 d   Predicted probability of 30-day post-discharge VTE: 0.16%  Other patient-specific factors to consider: none   Recommendation for Discharge:  No pharmacologic prophylaxis post-discharge  Follow daily and recalculate estimated 30d VTE risk if returns to OR or LOS reaches 3 days.   Sheila Ferguson is a 57 y.o. female who underwent  laparoscopic sleeve gastrectomy on 12/2   Case start: 1033 Case end: 1155   Allergies  Allergen Reactions  . Ceftin [Cefuroxime Axetil] Nausea And Vomiting  . Shrimp [Shellfish Allergy] Anaphylaxis, Itching and Other (See Comments)    Blisters appeared in mouth, throat itched, chest hurt, passed out  . Pineapple Itching and Other (See Comments)    Fresh pineapple only; itching and tongue becomes dark  . Adhesive [Tape] Other (See Comments)    EKG leads leave bruises  . Azithromycin Rash and Other (See Comments)    Bumps on face    Patient Measurements: Height: 5\' 4"  (162.6 cm) Weight: 264 lb 3.2 oz (119.8 kg) IBW/kg (Calculated) : 54.7 Body mass index is 45.35 kg/m.  Recent Labs    08/14/18 0825 08/14/18 1640  WBC 10.0 14.4*  HGB 12.1 12.0  HCT 34.9* 36.0  PLT 415* 349  CREATININE 1.34* 1.39*  ALBUMIN 4.0  --   PROT 8.0  --   AST 22  --   ALT 26  --   ALKPHOS 92  --   BILITOT 0.6  --    Estimated Creatinine Clearance: 56.9 mL/min (A) (by C-G formula based on SCr of 1.39 mg/dL (H)).    Past Medical History:  Diagnosis Date  . Allergy   . Anal fissure   . Anemia   . Anxiety   . Arthritis   . Bradycardia    slow heart rate per pt - unsure of rate   . Congenital stricture of esophagus   . Depression   . Diabetes mellitus type II   . DIABETES MELLITUS, TYPE II, UNCONTROLLED 09/01/2007   04/10/13 eye exam neg proliferative DM retinopathy, +non proliferative DM retinopathy OS and OD +amblyopia OD   . Dysmetabolic syndrome   . Gall bladder disease    removed  . GERD (gastroesophageal reflux disease)   . Headache(784.0)   . Heart murmur   . Hemorrhoids   . Hypertension   . Neuromuscular disorder (HCC)    ruptured disc in back, burcitis   . Sarcoid   . Sleep apnea    no cpap   . Thyroid disease    no meds needed at this time      Medications Prior to Admission  Medication Sig Dispense Refill Last Dose  . acetaminophen (TYLENOL) 500 MG tablet Take 2 tablets (1,000 mg total) by mouth 3 (three) times daily. (Patient taking differently: Take 1,000 mg by mouth every 8 (eight) hours as needed for mild pain or headache. ) 90 tablet 1 Past Month at Unknown time  . amLODipine (NORVASC) 10 MG tablet Take 1 tablet (10 mg total)  by mouth daily. (Patient taking differently: Take 5 mg by mouth daily. ) 90 tablet 3 08/14/2018 at 0600  . aspirin 325 MG tablet Take 325 mg by mouth daily.   07/31/2018  . Calcium Carb-Cholecalciferol (CALCIUM 600 + D PO) Take 1 tablet by mouth daily.    08/13/2018  . citalopram (CELEXA) 40 MG tablet TAKE 1 TABLET BY MOUTH ONCE DAILY (Patient taking differently: Take 40 mg by mouth at bedtime. ) 90 tablet 1 08/12/2018  . diclofenac sodium (VOLTAREN) 1 % GEL Apply 2 g topically 4 (four) times daily. (Patient taking differently: Apply 2 g topically 4 (four) times daily as needed (for pain). ) 100 g 0 08/12/2018  . diphenhydrAMINE (BENADRYL) 25 MG tablet Take 1 tablet (25 mg total) by mouth every 6 (six) hours for 4 doses. (Patient taking differently: Take 25 mg by mouth daily as needed for allergies. ) 4 tablet 0 Taking  . EPINEPHrine 0.3 mg/0.3 mL IJ SOAJ injection Inject 0.3 mLs  (0.3 mg total) into the muscle as needed (for anaphylaxis). 2 Device 0 never used  . gabapentin (NEURONTIN) 100 MG capsule Take 3 capsules (300 mg total) by mouth at bedtime. (Patient taking differently: Take 100 mg by mouth at bedtime. ) 90 capsule 2 08/14/2018 at 0600  . insulin glargine (LANTUS) 100 UNIT/ML injection INJECT 35 UNITS SUBCUTANEOUSLY ONCE DAILY (Patient taking differently: Inject 33 Units into the skin daily. ) 30 mL 0 08/14/2018 at 0600  . lisinopril (PRINIVIL,ZESTRIL) 40 MG tablet TAKE 1 TABLET BY MOUTH ONCE DAILY (Patient taking differently: Take 40 mg by mouth daily. ) 90 tablet 3 08/13/2018  . metFORMIN (GLUCOPHAGE XR) 500 MG 24 hr tablet Take 4 tablets (2,000 mg total) by mouth daily with breakfast. (Patient taking differently: Take 500-1,500 mg by mouth See admin instructions. Take 1500 mg by mouth in the morning and take 500 mg by mouth at bedtime) 360 tablet 3 08/13/2018  . metoprolol tartrate (LOPRESSOR) 100 MG tablet Take 1 tablet (100 mg total) by mouth 2 (two) times daily. 180 tablet 3 08/14/2018 at 0600  . Multiple Vitamin (MULTIVITAMIN) tablet Take 1 tablet by mouth daily.   08/11/2018  . omeprazole (PRILOSEC) 40 MG capsule Take 1 capsule (40 mg total) by mouth daily. 90 capsule 1 08/13/2018  . sitaGLIPtin (JANUVIA) 100 MG tablet Take 100 mg by mouth at bedtime.    08/12/2018  . VICTOZA 18 MG/3ML SOPN INJECT 0.3 ML (1.8 MG) INTO THE SKIN DAILY (Patient taking differently: Inject 1.8 mg into the skin daily. ) 12 pen 2 08/13/2018  . BAYER MICROLET LANCETS lancets Check blood sugar up to 5 times daily 100 each 12 08/06/2018  . Continuous Blood Gluc Receiver (FREESTYLE LIBRE READER) DEVI 1 each by Does not apply route 4 (four) times daily. 1 Device 0 08/06/2018  . Continuous Blood Gluc Sensor (FREESTYLE LIBRE SENSOR SYSTEM) MISC Use to check blood sugar at least 4 times daily 3 each 12 08/06/2018  . dextrose (RELION GLUCOSE) 40 % GEL Take 37.5 g by mouth once as needed for low blood  sugar. 2 Tube 5 08/06/2018  . glucose blood (BAYER CONTOUR NEXT TEST) test strip Use to check blood sugar up to 4 times a day before meals and bedtime and anytime you feel like you are having a low blood sugar 150 each 12 08/06/2018  . hydrocortisone (ANUSOL-HC) 25 MG suppository Place 1 suppository (25 mg total) rectally 2 (two) times daily. (Patient taking differently: Place 25 mg rectally 2 (  two) times daily as needed for hemorrhoids. ) 7 suppository 1 More than a month at Unknown time  . Insulin Syringe-Needle U-100 31G X 15/64" 0.5 ML MISC Use to inject insulin two times a day 100 each 8 08/06/2018  . loratadine (CLARITIN) 10 MG tablet TAKE ONE TABLET BY MOUTH ONCE DAILY AS NEEDED FOR ALLERGIES 90 tablet 1 08/06/2018  . lovastatin (MEVACOR) 20 MG tablet Take 1 tablet (20 mg total) by mouth at bedtime. 90 tablet 3 08/07/2018       Jan Walters A 08/14/2018,8:30 PM

## 2018-08-14 NOTE — Progress Notes (Signed)
Discussed post op day goals with patient including ambulation, IS, diet progression, pain, and nausea control.  Questions answered. 

## 2018-08-14 NOTE — Progress Notes (Signed)
Inpatient Diabetes Program Recommendations  AACE/ADA: New Consensus Statement on Inpatient Glycemic Control (2015)  Target Ranges:  Prepandial:   less than 140 mg/dL      Peak postprandial:   less than 180 mg/dL (1-2 hours)      Critically ill patients:  140 - 180 mg/dL   Results for Sheila Ferguson, Sheila Ferguson (MRN [id number]) as of 08/14/2018 14:53  Ref. Range 08/14/2018 07:46 08/14/2018 12:09  Glucose-Capillary Latest Ref Range: 70 - 99 mg/dL 409118 (H) 811214 (H)  10 mg Decadron X 1 dose at 10:22am   Results for Sheila Ferguson, Sheila Ferguson (MRN [id number]) as of 08/14/2018 14:53  Ref. Range 11/08/2017 16:05 03/24/2018 16:57 07/14/2018 11:14  Hemoglobin A1C Latest Ref Range: 4.8 - 5.6 % 7.9 7.9 (A) 9.0 (H)    Admit with: Morbid Obesity/ Gastric Sleeve resection  History: DM  Home DM Meds: Lantus 33 units Daily       Metformin 1500 mg QAM and 500 mg QHS       Januvia 100 mg QHS       Victoza 1.8 mg Daily  Current Orders: Novolog Resistant Correction Scale/ SSI (0-20 units) Q4 hours (Signed and Held)     Received consult for recommendations for patient post-op bariatric surgery.  Note patient has signed and held orders to start Novolog Resistant Correction Scale/ SSI (0-20 units) Q4 hours this afternoon.  Would like to observe pt's CBG patterns over the next 12-18 hours to better provide guidance for home Diabetes medications post-op.     --Will follow patient during hospitalization--  Ambrose FinlandJeannine Johnston [name] RN, MSN, CDE Diabetes Coordinator Inpatient Glycemic Control Team Team Pager: [phone] (8a-5p)

## 2018-08-14 NOTE — Anesthesia Procedure Notes (Signed)
Procedure Name: Intubation Date/Time: 08/14/2018 10:04 AM Performed by: [place], Shanzay Hepworth C, CRNA Pre-anesthesia Checklist: Patient identified, Emergency Drugs available, Suction available and Patient being monitored Patient Re-evaluated:Patient Re-evaluated prior to induction Oxygen Delivery Method: Circle system utilized Preoxygenation: Pre-oxygenation with 100% oxygen Induction Type: IV induction Ventilation: Mask ventilation without difficulty Tube type: Oral Tube size: 7.0 mm Number of attempts: 1 Airway Equipment and Method: Stylet and Oral airway Placement Confirmation: ETT inserted through vocal cords under direct vision,  positive ETCO2 and breath sounds checked- equal and bilateral Secured at: 20 cm Tube secured with: Tape Dental Injury: Teeth and Oropharynx as per pre-operative assessment

## 2018-08-14 NOTE — Op Note (Signed)
Name:  Sheila Ferguson MRN: [id number] Date of Surgery: 08/14/2018  Preop Diagnosis:  Morbid Obesity  Postop Diagnosis:  Morbid Obesity (Weight - 262, BMI - 45.3), S/P Gastric Sleeve resection  Procedure:  Upper endoscopy  (Intraoperative)  Surgeon:  Ovidio Kinavid Patton Swisher, M.D.  Anesthesia:  GET  Indications for procedure: Sheila Ferguson is a 57 y.o. female whose primary care physician is Theotis BarrioLee, Joshua K, MD and has completed a gastric sleeve resection today for weight loss by Dr. Daphine DeutscherMartin.  I am doing an intraoperative upper endoscopy to evaluate the gastric pouch after the sleeve gastrectomy.  Operative Note: The patient is under general anesthesia.  Dr. Daphine DeutscherMartin is laparoscoping the patient while I do an upper endoscopy to evaluate the stomach pouch.  With the patient intubated, I passed the Olympus upper endoscope without difficulty down the esophagus.  The esophagus was unremarkable.  The esophago-gastric junction was at 39 cm.    The mucosa of the stomach looked viable and the staple line was intact without bleeding.  I advanced the scope to the pylorus, but did not go through it.  While I insufflated the stomach pouch with air, Dr. Daphine DeutscherMartin  flooded the upper abdomen with saline to put the gastric pouch under saline.  There was no bubbling or evidence of a leak.  There was no evidence of narrowing of the pouch and the gastric sleeve looked tubular.  The scope was then withdrawn.  The esophagus was unremarkable and the patient tolerated the endoscopy without difficulty.  Ovidio Kinavid Delainee Tramel, MD, [facility] Pager: [phone] Office phone:  [phone]

## 2018-08-14 NOTE — Op Note (Signed)
14 Aug 2018  Surgeon: Wenda LowMatt Harrell Niehoff, MD, FACS  Asst:  Ovidio Kinavid Newman, MD, FACS  Anes:  General endotracheal  Procedure: Laparoscopic sleeve gastrectomy and upper endoscopy  Diagnosis: Morbid obesity  Complications: None noted  EBL:   minimal cc  Description of Procedure:  The patient was take to OR 4 and given general anesthesia.  The abdomen was prepped with Technicare and draped sterilely.  A timeout was performed.  Access to the abdomen was achieved with a 5 mm Optiview through the left upper quadrant.  .  Following insufflation, the state of the abdomen was found to be free of adhesions.  The balloon tip catheter was inserted and tested and no hiatal hernia was noted.  The ViSiGi 36Fr tube was inserted to deflate the stomach and was pulled back into the esophagus.    The pylorus was identified and we measured 5 cm back and marked the antrum.  At that point we began dissection to take down the greater curvature of the stomach using the Sonicision scalpel.  This dissection was taken all the way up to the left crus.  Posterior attachments of the stomach were also taken down.    The ViSiGi tube was then passed into the antrum and suction applied so that it was snug along the lessor curvature.  The "crow's foot" or incisura was identified.  The sleeve gastrectomy was begun using the [employer] platform stapler beginning with a 4.5 cm black load with TRS followed by a 6 cm black with TRS and then completed with purple 6 cm with TRS.  When the sleeve was complete the tube was taken off suction and insufflated briefly.  The tube was withdrawn.  Upper endoscopy was then performed by Dr. Ezzard StandingNewman.     The specimen was extracted through the 15 trocar site.  This was closed with the PRI and 0 vicryl.  Local was provided by infiltrating with Exparel TAP block and closed 4-0 Monocryl and Dermabond.    Matt B. Daphine DeutscherMartin, MD, [facility], [place] [phone]

## 2018-08-14 NOTE — Interval H&P Note (Signed)
History and Physical Interval Note:  08/14/2018 9:42 AM  Sheila Ferguson  has presented today for surgery, with the diagnosis of Morbid Obesity, DM II, OSA, HTN, H/O CVA, Hypercholesterolemia, GERD  The various methods of treatment have been discussed with the patient and family. After consideration of risks, benefits and other options for treatment, the patient has consented to  Procedure(s): LAPAROSCOPIC GASTRIC SLEEVE RESECTION, UPPER ENDO, ERAS PATHWAY (N/A) as a surgical intervention .  The patient's history has been reviewed, patient examined, no change in status, stable for surgery.  I have reviewed the patient's chart and labs.  Questions were answered to the patient's satisfaction.     Valarie MerinoMatthew B Sander Remedios

## 2018-08-14 NOTE — Anesthesia Preprocedure Evaluation (Signed)
Anesthesia Evaluation  Patient identified by MRN, date of birth, ID band Patient awake    Reviewed: Allergy & Precautions, NPO status , Patient's Chart, lab work & pertinent test results  Airway Mallampati: II  TM Distance: >3 FB Neck ROM: Full    Dental no notable dental hx.    Pulmonary sleep apnea ,    Pulmonary exam normal breath sounds clear to auscultation       Cardiovascular hypertension, negative cardio ROS Normal cardiovascular exam Rhythm:Regular Rate:Normal     Neuro/Psych negative neurological ROS  negative psych ROS   GI/Hepatic Neg liver ROS, GERD  ,  Endo/Other  diabetesMorbid obesity  Renal/GU negative Renal ROS  negative genitourinary   Musculoskeletal negative musculoskeletal ROS (+)   Abdominal   Peds negative pediatric ROS (+)  Hematology negative hematology ROS (+)   Anesthesia Other Findings   Reproductive/Obstetrics negative OB ROS                             Anesthesia Physical Anesthesia Plan  ASA: III  Anesthesia Plan: General   Post-op Pain Management:    Induction: Intravenous  PONV Risk Score and Plan: 3 and Ondansetron, Dexamethasone, Midazolam, Scopolamine patch - Pre-op and Treatment may vary due to age or medical condition  Airway Management Planned: Oral ETT  Additional Equipment:   Intra-op Plan:   Post-operative Plan: Extubation in OR  Informed Consent: I have reviewed the patients History and Physical, chart, labs and discussed the procedure including the risks, benefits and alternatives for the proposed anesthesia with the patient or authorized representative who has indicated his/her understanding and acceptance.   Dental advisory given  Plan Discussed with: CRNA and Surgeon  Anesthesia Plan Comments:         Anesthesia Quick Evaluation

## 2018-08-14 NOTE — Transfer of Care (Signed)
Immediate Anesthesia Transfer of Care Note  Patient: Sheila Ferguson  Procedure(s) Performed: LAPAROSCOPIC GASTRIC SLEEVE RESECTION, UPPER ENDO, ERAS PATHWAY (N/A )  Patient Location: PACU  Anesthesia Type:General  Level of Consciousness: awake, alert  and oriented  Airway & Oxygen Therapy: Patient Spontanous Breathing and Patient connected to face mask oxygen  Post-op Assessment: Report given to RN and Post -op Vital signs reviewed and stable  Post vital signs: Reviewed and stable  Last Vitals:  Vitals Value Taken Time  BP 148/74 08/14/2018 12:03 PM  Temp    Pulse 91 08/14/2018 12:07 PM  Resp 19 08/14/2018 12:07 PM  SpO2 100 % 08/14/2018 12:07 PM  Vitals shown include unvalidated device data.  Last Pain:  Vitals:   08/14/18 0806  TempSrc:   PainSc: 0-No pain         Complications: No apparent anesthesia complications

## 2018-08-14 NOTE — Discharge Instructions (Signed)
° ° ° °GASTRIC BYPASS/SLEEVE ° Home Care Instructions ° ° These instructions are to help you care for yourself when you go home. ° °Call: If you have any problems. °• Call [phone] and ask for the surgeon on call °• If you need immediate help, come to the ER at .  °• Tell the ER staff that you are a new post-op gastric bypass or gastric sleeve patient °  °Signs and symptoms to report: • Severe vomiting or nausea °o If you cannot keep down clear liquids for longer than 1 day, call your surgeon  °• Abdominal pain that does not get better after taking your pain medication °• Fever over 100.4° F with chills °• Heart beating over 100 beats a minute °• Shortness of breath at rest °• Chest pain °•  Redness, swelling, drainage, or foul odor at incision (surgical) sites °•  If your incisions open or pull apart °• Swelling or pain in calf (lower leg) °• Diarrhea (Loose bowel movements that happen often), frequent watery, uncontrolled bowel movements °• Constipation, (no bowel movements for 3 days) if this happens: Pick one °o Milk of Magnesia, 2 tablespoons by mouth, 3 times a day for 2 days if needed °o Stop taking Milk of Magnesia once you have a bowel movement °o Call your doctor if constipation continues °Or °o Miralax  (instead of Milk of Magnesia) following the label instructions °o Stop taking Miralax once you have a bowel movement °o Call your doctor if constipation continues °• Anything you think is not normal °  °Normal side effects after surgery: • Unable to sleep at night or unable to focus °• Irritability or moody °• Being tearful (crying) or depressed °These are common complaints, possibly related to your anesthesia medications that put you to sleep, stress of surgery, and change in lifestyle.  This usually goes away a few weeks after surgery.  If these feelings continue, call your primary care doctor. °  °Wound Care: You may have surgical glue, steri-strips, or staples over your incisions after  surgery °• Surgical glue:  Looks like a clear film over your incisions and will wear off a little at a time °• Steri-strips: Strips of tape over your incisions. You may notice a yellowish color on the skin under the steri-strips. This is used to make the   steri-strips stick better. Do not pull the steri-strips off - let them fall off °• Staples: Staples may be removed before you leave the hospital °o If you go home with staples, call [facility], [phone] at for an appointment with your surgeon’s nurse to have staples removed 10 days after surgery. °• Showering: You may shower two (2) days after your surgery unless your surgeon tells you differently °o Wash gently around incisions with warm soapy water, rinse well, and gently pat dry  °o No tub baths until staples are removed, steri-strips fall off or glue is gone.  °  °Medications: • Medications should be liquid or crushed if larger than the size of a dime °• Extended release pills (medication that release a little bit at a time through the day) should NOT be crushed or cut. (examples include XL, ER, DR, SR) °• Depending on the size and number of medications you take, you may need to space (take a few throughout the day)/change the time you take your medications so that you do not over-fill your pouch (smaller stomach) °• Make sure you follow-up with your primary care doctor to   make medication changes needed during rapid weight loss and life-style changes °• If you have diabetes, follow up with the doctor that orders your diabetes medication(s) within one week after surgery and check your blood sugar regularly. °• Do not drive while taking prescription pain medication  °• It is ok to take Tylenol by the bottle instructions with your pain medicine or instead of your pain medicine as needed.  DO NOT TAKE NSAIDS (EXAMPLES OF NSAIDS:  IBUPROFREN/ NAPROXEN)  °Diet:                    First 2 Weeks ° You will see the dietician t about two (2) weeks  after your surgery. The dietician will increase the types of foods you can eat if you are handling liquids well: °• If you have severe vomiting or nausea and cannot keep down clear liquids lasting longer than 1 day, call your surgeon @ ([phone]) °Protein Shake °• Drink at least 2 ounces of shake 5-6 times per day °• Each serving of protein shakes (usually 8 - 12 ounces) should have: °o 15 grams of protein  °o And no more than 5 grams of carbohydrate  °• Goal for protein each day: °o Men = 80 grams per day °o Women = 60 grams per day °• Protein powder may be added to fluids such as non-fat milk or Lactaid milk or unsweetened Soy/Almond milk (limit to 35 grams added protein powder per serving) ° °Hydration °• Slowly increase the amount of water and other clear liquids as tolerated (See Acceptable Fluids) °• Slowly increase the amount of protein shake as tolerated  °•  Sip fluids slowly and throughout the day.  Do not use straws. °• May use sugar substitutes in small amounts (no more than 6 - 8 packets per day; i.e. Splenda) ° °Fluid Goal °• The first goal is to drink at least 8 ounces of protein shake/drink per day (or as directed by the nutritionist); some examples of protein shakes are Syntrax Nectar, Adkins Advantage, EAS Edge HP, and Unjury. See handout from pre-op Bariatric Education Class: °o Slowly increase the amount of protein shake you drink as tolerated °o You may find it easier to slowly sip shakes throughout the day °o It is important to get your proteins in first °• Your fluid goal is to drink 64 - 100 ounces of fluid daily °o It may take a few weeks to build up to this °• 32 oz (or more) should be clear liquids  °And  °• 32 oz (or more) should be full liquids (see below for examples) °• Liquids should not contain sugar, caffeine, or carbonation ° °Clear Liquids: °• Water or Sugar-free flavored water (i.e. Fruit H2O, Propel) °• Decaffeinated coffee or tea (sugar-free) °• Crystal Lite, Wyler’s Lite,  Minute Maid Lite °• Sugar-free Jell-O °• Bouillon or broth °• Sugar-free Popsicle:   *Less than 20 calories each; Limit 1 per day ° °Full Liquids: °Protein Shakes/Drinks + 2 choices per day of other full liquids °• Full liquids must be: °o No More Than 15 grams of Carbs per serving  °o No More Than 3 grams of Fat per serving °• Strained low-fat cream soup (except Cream of Potato or Tomato) °• Non-Fat milk °• Fat-free Lactaid Milk °• Unsweetened Soy Or Unsweetened Almond Milk °• Low Sugar yogurt (Dannon Lite & Fit, Greek yogurt; Oikos Triple Zero; Chobani Simply 100; Yoplait 100 calorie Greek - No Fruit on the Bottom) ° °  °Vitamins   and Minerals • Start 1 day after surgery unless otherwise directed by your surgeon °• 2 Chewable Bariatric Specific Multivitamin / Multimineral Supplement with iron (Example: Bariatric Advantage Multi EA) °• Chewable Calcium with Vitamin D-3 °(Example: 3 Chewable Calcium Plus 600 with Vitamin D-3) °o Take 500 mg three (3) times a day for a total of 1500 mg each day °o Do not take all 3 doses of calcium at one time as it may cause constipation, and you can only absorb 500 mg  at a time  °o Do not mix multivitamins containing iron with calcium supplements; take 2 hours apart °• Menstruating women and those with a history of anemia (a blood disease that causes weakness) may need extra iron °o Talk with your doctor to see if you need more iron °• Do not stop taking or change any vitamins or minerals until you talk to your dietitian or surgeon °• Your Dietitian and/or surgeon must approve all vitamin and mineral supplements °  °Activity and Exercise: Limit your physical activity as instructed by your doctor.  It is important to continue walking at home.  During this time, use these guidelines: °• Do not lift anything greater than ten (10) pounds for at least two (2) weeks °• Do not go back to work or drive until your surgeon says you can °• You may have sex when you feel comfortable  °o It is  VERY important for female patients to use a reliable birth control method; fertility often increases after surgery  °o All hormonal birth control will be ineffective for 30 days after surgery due to medications given during surgery a barrier method must be used. °o Do not get pregnant for at least 18 months °• Start exercising as soon as your doctor tells you that you can °o Make sure your doctor approves any physical activity °• Start with a simple walking program °• Walk 5-15 minutes each day, 7 days per week.  °• Slowly increase until you are walking 30-45 minutes per day °Consider joining our BELT program. [phone] or email [email or web address] °  °Special Instructions Things to remember: °• Use your CPAP when sleeping if this applies to you ° °• [facility] has two free Bariatric Surgery Support Groups that meet monthly °o The 3rd Thursday of each month, 6 pm, [facility]  °o The 2nd Friday of each month, 11:45 am in the private dining room in the basement of [facility] °• It is very important to keep all follow up appointments with your surgeon, dietitian, primary care physician, and behavioral health practitioner °• Routine follow up schedule with your surgeon include appointments at 2-3 weeks, 6-8 weeks, 6 months, and 1 year at a minimum.  Your surgeon may request to see you more often.   °o After the first year, please follow up with your bariatric surgeon and dietitian at least once a year in order to maintain best weight loss results °[facility]: [phone] °[facility]: [phone] °Bariatric Nurse Coordinator: [phone] °  °   Reviewed and Endorsed  °by [facility] Patient Education Committee, June, 2016 °Edits Approved: Aug, 2018 ° ° ° °

## 2018-08-15 ENCOUNTER — Encounter (HOSPITAL_COMMUNITY): Payer: Self-pay | Admitting: Surgery

## 2018-08-15 LAB — CBC WITH DIFFERENTIAL/PLATELET
ABS IMMATURE GRANULOCYTES: 0.05 10*3/uL (ref 0.00–0.07)
Basophils Absolute: 0 10*3/uL (ref 0.0–0.1)
Basophils Relative: 0 %
Eosinophils Absolute: 0 10*3/uL (ref 0.0–0.5)
Eosinophils Relative: 0 %
HCT: 32.3 % — ABNORMAL LOW (ref 36.0–46.0)
Hemoglobin: 11.2 g/dL — ABNORMAL LOW (ref 12.0–15.0)
Immature Granulocytes: 0 %
Lymphocytes Relative: 20 %
Lymphs Abs: 2.3 10*3/uL (ref 0.7–4.0)
MCH: 28.1 pg (ref 26.0–34.0)
MCHC: 34.7 g/dL (ref 30.0–36.0)
MCV: 81.2 fL (ref 80.0–100.0)
Monocytes Absolute: 0.8 10*3/uL (ref 0.1–1.0)
Monocytes Relative: 7 %
NEUTROS ABS: 8.7 10*3/uL — AB (ref 1.7–7.7)
Neutrophils Relative %: 73 %
Platelets: 384 10*3/uL (ref 150–400)
RBC: 3.98 MIL/uL (ref 3.87–5.11)
RDW: 13.1 % (ref 11.5–15.5)
WBC: 11.9 10*3/uL — ABNORMAL HIGH (ref 4.0–10.5)
nRBC: 0 % (ref 0.0–0.2)

## 2018-08-15 LAB — GLUCOSE, CAPILLARY
GLUCOSE-CAPILLARY: 159 mg/dL — AB (ref 70–99)
GLUCOSE-CAPILLARY: 182 mg/dL — AB (ref 70–99)
Glucose-Capillary: 193 mg/dL — ABNORMAL HIGH (ref 70–99)
Glucose-Capillary: 176 mg/dL — ABNORMAL HIGH (ref 70–99)

## 2018-08-15 MED ORDER — PANTOPRAZOLE SODIUM 40 MG PO TBEC: 40.0000 mg | DELAYED_RELEASE_TABLET | Freq: Every day | ORAL | 0 refills | Status: DC

## 2018-08-15 MED ORDER — ONDANSETRON 4 MG PO TBDP: 4.0000 mg | ORAL_TABLET | Freq: Four times a day (QID) | ORAL | 0 refills | Status: DC | PRN

## 2018-08-15 MED ORDER — GABAPENTIN 300 MG PO CAPS: 300.0000 mg | ORAL_CAPSULE | Freq: Three times a day (TID) | ORAL | 0 refills | Status: DC

## 2018-08-15 MED ORDER — OXYCODONE HCL 5 MG/5ML PO SOLN: 5.0000 mg | ORAL | 0 refills | Status: DC | PRN

## 2018-08-15 NOTE — Discharge Summary (Signed)
Physician Discharge Summary  Patient ID: Sheila Ferguson MRN: [name] DOB/AGE: 06/03/1961 57 y.o.  PCP: Theotis BarrioLee, Joshua K, MD  Admit date: 08/14/2018 Discharge date: 08/15/2018  Admission Diagnoses:  Morbid obesity and DM  Discharge Diagnoses:  same  Principal Problem:   S/P laparoscopic sleeve gastrectomy Dec 2019   Surgery:  Laparoscopic sleeve gastrectomy  Discharged Condition: improved  Hospital Course:   The patient had a sleeve gastrectomy and was begun on liquids postop.  She was advanced and was ready for discharge on PD 1.  Adjustment recommendations were made by DM coordinator about post discharge medications.    Consults: Diabetes nurse coordinator  Significant Diagnostic Studies: none    Discharge Exam: Blood pressure (!) 181/78, pulse 81, temperature 99.1 F (37.3 C), temperature source Oral, resp. rate 17, height 5\' 4"  (1.626 m), weight 119.8 kg, SpO2 100 %. Incisions OK  Disposition: Discharge disposition: 01-Home or Self Care       Discharge Instructions    Ambulate hourly while awake   Complete by:  As directed    Call MD for:  difficulty breathing, headache or visual disturbances   Complete by:  As directed    Call MD for:  persistant dizziness or light-headedness   Complete by:  As directed    Call MD for:  persistant nausea and vomiting   Complete by:  As directed    Call MD for:  redness, tenderness, or signs of infection (pain, swelling, redness, odor or green/yellow discharge around incision site)   Complete by:  As directed    Call MD for:  severe uncontrolled pain   Complete by:  As directed    Call MD for:  temperature >101 F   Complete by:  As directed    Diet bariatric full liquid   Complete by:  As directed    Incentive spirometry   Complete by:  As directed    Perform hourly while awake     Allergies as of 08/15/2018      Reactions   Ceftin [cefuroxime Axetil] Nausea And Vomiting   Shrimp [shellfish Allergy] Anaphylaxis,  Itching, Other (See Comments)   Blisters appeared in mouth, throat itched, chest hurt, passed out   Pineapple Itching, Other (See Comments)   Fresh pineapple only; itching and tongue becomes dark   Adhesive [tape] Other (See Comments)   EKG leads leave bruises   Azithromycin Rash, Other (See Comments)   Bumps on face      Medication List    STOP taking these medications   BAYER MICROLET LANCETS lancets   CALCIUM 600 + D PO   diclofenac sodium 1 % Gel Commonly known as:  VOLTAREN   diphenhydrAMINE 25 MG tablet Commonly known as:  BENADRYL   FREESTYLE LIBRE READER Devi   FREESTYLE LIBRE SENSOR SYSTEM Misc   glucose blood test strip   insulin glargine 100 UNIT/ML injection Commonly known as:  LANTUS   Insulin Syringe-Needle U-100 31G X 15/64" 0.5 ML Misc   loratadine 10 MG tablet Commonly known as:  CLARITIN   metFORMIN 500 MG 24 hr tablet Commonly known as:  GLUCOPHAGE-XR   multivitamin tablet     TAKE these medications   acetaminophen 500 MG tablet Commonly known as:  TYLENOL Take 2 tablets (1,000 mg total) by mouth 3 (three) times daily. What changed:    when to take this  reasons to take this   amLODipine 10 MG tablet Commonly known as:  NORVASC Take 1 tablet (10  mg total) by mouth daily. What changed:  how much to take   aspirin 325 MG tablet Take 325 mg by mouth daily. Notes to patient:  Avoid NSAIDs for 6-8 weeks after surgery   citalopram 40 MG tablet Commonly known as:  CELEXA TAKE 1 TABLET BY MOUTH ONCE DAILY What changed:  when to take this   dextrose 40 % Gel Commonly known as:  GLUTOSE Take 37.5 g by mouth once as needed for low blood sugar.   EPINEPHrine 0.3 mg/0.3 mL Soaj injection Commonly known as:  EPI-PEN Inject 0.3 mLs (0.3 mg total) into the muscle as needed (for anaphylaxis).   gabapentin 100 MG capsule Commonly known as:  NEURONTIN Take 3 capsules (300 mg total) by mouth at bedtime. What changed:  how much to take    gabapentin 300 MG capsule Commonly known as:  NEURONTIN Take 1 capsule (300 mg total) by mouth every 8 (eight) hours. for 30 days What changed:  You were already taking a medication with the same name, and this prescription was added. Make sure you understand how and when to take each.   hydrocortisone 25 MG suppository Commonly known as:  ANUSOL-HC Place 1 suppository (25 mg total) rectally 2 (two) times daily. What changed:    when to take this  reasons to take this   lisinopril 40 MG tablet Commonly known as:  PRINIVIL,ZESTRIL TAKE 1 TABLET BY MOUTH ONCE DAILY Notes to patient:  Monitor Blood Pressure Daily and keep a log for primary care physician.  You may need to make changes to your medications with rapid weight loss.     lovastatin 20 MG tablet Commonly known as:  MEVACOR Take 1 tablet (20 mg total) by mouth at bedtime.   metoprolol tartrate 100 MG tablet Commonly known as:  LOPRESSOR Take 1 tablet (100 mg total) by mouth 2 (two) times daily. Notes to patient:  Monitor Blood Pressure Daily and keep a log for primary care physician.  You may need to make changes to your medications with rapid weight loss.     omeprazole 40 MG capsule Commonly known as:  PRILOSEC Take 1 capsule (40 mg total) by mouth daily.   ondansetron 4 MG disintegrating tablet Commonly known as:  ZOFRAN-ODT Take 1 tablet (4 mg total) by mouth every 6 (six) hours as needed for nausea or vomiting.   oxyCODONE 5 MG/5ML solution Commonly known as:  ROXICODONE Take 5-10 mLs (5-10 mg total) by mouth every 4 (four) hours as needed for moderate pain or severe pain.   pantoprazole 40 MG tablet Commonly known as:  PROTONIX Take 1 tablet (40 mg total) by mouth daily.   sitaGLIPtin 100 MG tablet Commonly known as:  JANUVIA Take 100 mg by mouth at bedtime. Notes to patient:  Monitor Blood Sugar Frequently and keep a log for primary care physician, you may need to adjust medication dosage with rapid  weight loss.     VICTOZA 18 MG/3ML Sopn Generic drug:  liraglutide INJECT 0.3 ML (1.8 MG) INTO THE SKIN DAILY What changed:  See the new instructions.      Follow-up Information    Luretha Murphy, MD. Go on 09/08/2018.   Specialty:  General Surgery Why:  at [facility] information: [address] [place] [address] [phone]        Hedda Slade, PA-C. Go on 10/03/2018.   Specialty:  General Surgery Why:  at [address] Contact information: [address] [place]  [place] [address] [phone]           Signed: Valarie Merino 08/15/2018, 6:52 PM

## 2018-08-15 NOTE — Telephone Encounter (Signed)
Call for GTF, but provided support for post gastric surgery. She confirmed her appointment with Dr. Nedra HaiLee for 08/22/18.

## 2018-08-15 NOTE — Progress Notes (Signed)
Discharge instructions discussedwith patiietn and family,verbalized agreement and understanding

## 2018-08-15 NOTE — Anesthesia Postprocedure Evaluation (Signed)
Anesthesia Post Note  Patient: Sheila Ferguson  Procedure(s) Performed: LAPAROSCOPIC GASTRIC SLEEVE RESECTION, UPPER ENDO, ERAS PATHWAY (N/A )     Patient location during evaluation: PACU Anesthesia Type: General Level of consciousness: awake and alert Pain management: pain level controlled Vital Signs Assessment: post-procedure vital signs reviewed and stable Respiratory status: spontaneous breathing, nonlabored ventilation, respiratory function stable and patient connected to nasal cannula oxygen Cardiovascular status: blood pressure returned to baseline and stable Postop Assessment: no apparent nausea or vomiting Anesthetic complications: no    Last Vitals:  Vitals:   08/15/18 0136 08/15/18 0545  BP: (!) 152/84 (!) 141/66  Pulse: (!) 102 93  Resp: 18 18  Temp: 36.8 C 36.7 C  SpO2: 90% 97%    Last Pain:  Vitals:   08/15/18 0636  TempSrc:   PainSc: 2                  Tametria Aho S

## 2018-08-15 NOTE — Care Management Note (Signed)
Case Management Note  Patient Details  Name: Sheila CaseyCassandra N Kroeger MRN: [id number] Date of Birth: Jan 06, 1961  Subjective/Objective: Morbid obesity. S/p sleeve gastrectomy. From home. No CM needs.                   Action/Plan:dc home.   Expected Discharge Date:                  Expected Discharge Plan:  Home/Self Care  In-House Referral:     Discharge planning Services  CM Consult  Post Acute Care Choice:    Choice offered to:     DME Arranged:    DME Agency:     HH Arranged:    HH Agency:     Status of Service:  Completed, signed off  If discussed at [employer] Length of Stay Meetings, dates discussed:    Additional Comments:  Lanier ClamMahabir, Lennon Boutwell, RN 08/15/2018, 11:23 AM

## 2018-08-15 NOTE — Progress Notes (Signed)
Inpatient Diabetes Program Recommendations  AACE/ADA: New Consensus Statement on Inpatient Glycemic Control (2015)  Target Ranges:  Prepandial:   less than 140 mg/dL      Peak postprandial:   less than 180 mg/dL (1-2 hours)      Critically ill patients:  140 - 180 mg/dL   Results for Sheila Ferguson, Sheila Ferguson (MRN [id number]) as of 08/15/2018 11:33  Ref. Range 08/14/2018 07:46 08/14/2018 12:09 08/14/2018 18:19 08/14/2018 20:07 08/14/2018 23:43 08/15/2018 04:01 08/15/2018 07:45  Glucose-Capillary Latest Ref Range: 70 - 99 mg/dL 118 (H) 214 (H) 175 (H)  4 units NOVOLOG  187 (H)  4 units NOVOLOG  166 (H)  4 units NOVOLOG  193 (H)  4 units NOVOLOG  176 (H)  4 units NOVOLOG     Admit with: Morbid Obesity/ Gastric Sleeve resection  History: DM  Home DM Meds: Lantus 30 units Daily                             Metformin 1500 mg QAM and 500 mg QHS                             Januvia 100 mg QHS                             Victoza 1.8 mg Daily  Current Orders: Novolog Resistant Correction Scale/ SSI (0-20 units) Q4 hours      Met with pt today.  She told me her PCP ([facility] did not give her advice on what to do with her DM meds after surgery.  Has follow up appt with PCP on 08/22/2018.  Is supposed to discuss DM Meds at that next PCP visit.  Discussed with patient all of her current DM meds (Lantus, Metformin, Januvia, Victoza).  Explained how each work and side effects of each.    Patient requiring minimal Novolog since 6pm yesterday (also note that pt received 10 mg Decadron X 1 dose yesterday at 10am which may be affecting CBGs as well).   Recommend the following for d/c until pt follows up with PCP:  1. Stop Lantus for now (can resume at 50% home dose if having glucose elevations at home)  2. Stop Metformin (may have GI upset with this medication considering her PO intake is much less)  3. Continue Victoza and Continue Januvia--These 2  medications operate in a glucose dependent fashion and have much less risk of Hypoglycemia      --Will follow patient during hospitalization--  Wyn Quaker RN, MSN, CDE Diabetes Coordinator Inpatient Glycemic Control Team Team Pager: [phone] (8a-5p)

## 2018-08-15 NOTE — Plan of Care (Signed)

## 2018-08-15 NOTE — Progress Notes (Addendum)
Patient alert and oriented, pain is controlled. Patient is tolerating fluids, advanced to protein shake today, patient is tolerating well.  Reviewed Gastric sleeve discharge instructions with patient and patient is able to articulate understanding.  Provided information on BELT program, Support Group and [facility] outpatient pharmacy. All questions answered, will continue to monitor.  Total fluid intake 670  Per dehydration protocol call back one week post op

## 2018-08-19 ENCOUNTER — Other Ambulatory Visit: Payer: Self-pay

## 2018-08-19 ENCOUNTER — Encounter (HOSPITAL_COMMUNITY): Payer: Self-pay | Admitting: *Deleted

## 2018-08-19 ENCOUNTER — Emergency Department (HOSPITAL_COMMUNITY)
Admission: EM | Admit: 2018-08-19 | Discharge: 2018-08-20 | Disposition: A | Discharge status: Home / Self Care | Payer: BLUE CROSS/BLUE SHIELD | Attending: Emergency Medicine | Admitting: Emergency Medicine

## 2018-08-19 DIAGNOSIS — I129 Hypertensive chronic kidney disease with stage 1 through stage 4 chronic kidney disease, or unspecified chronic kidney disease: Secondary | ICD-10-CM | POA: Diagnosis not present

## 2018-08-19 DIAGNOSIS — R42 Dizziness and giddiness: Secondary | ICD-10-CM | POA: Diagnosis not present

## 2018-08-19 DIAGNOSIS — Z79899 Other long term (current) drug therapy: Secondary | ICD-10-CM | POA: Diagnosis not present

## 2018-08-19 DIAGNOSIS — N182 Chronic kidney disease, stage 2 (mild): Secondary | ICD-10-CM | POA: Diagnosis not present

## 2018-08-19 DIAGNOSIS — Z7984 Long term (current) use of oral hypoglycemic drugs: Secondary | ICD-10-CM | POA: Diagnosis not present

## 2018-08-19 DIAGNOSIS — E1122 Type 2 diabetes mellitus with diabetic chronic kidney disease: Secondary | ICD-10-CM | POA: Diagnosis not present

## 2018-08-19 DIAGNOSIS — E113299 Type 2 diabetes mellitus with mild nonproliferative diabetic retinopathy without macular edema, unspecified eye: Secondary | ICD-10-CM | POA: Insufficient documentation

## 2018-08-19 DIAGNOSIS — R21 Rash and other nonspecific skin eruption: Secondary | ICD-10-CM | POA: Diagnosis not present

## 2018-08-19 LAB — COMPREHENSIVE METABOLIC PANEL
ALT: 33 U/L (ref 0–44)
AST: 21 U/L (ref 15–41)
Albumin: 4 g/dL (ref 3.5–5.0)
Alkaline Phosphatase: 103 U/L (ref 38–126)
Anion gap: 10 (ref 5–15)
BUN: 24 mg/dL — ABNORMAL HIGH (ref 6–20)
CALCIUM: 9.4 mg/dL (ref 8.9–10.3)
CO2: 25 mmol/L (ref 22–32)
CREATININE: 1.13 mg/dL — AB (ref 0.44–1.00)
Chloride: 105 mmol/L (ref 98–111)
GFR calc Af Amer: >60 mL/min (ref >60)
GFR calc non Af Amer: 54 mL/min — ABNORMAL LOW (ref >60)
Glucose, Bld: 108 mg/dL — ABNORMAL HIGH (ref 70–99)
Potassium: 3.6 mmol/L (ref 3.5–5.1)
Sodium: 140 mmol/L (ref 135–145)
Total Bilirubin: 0.4 mg/dL (ref 0.3–1.2)
Total Protein: 7.9 g/dL (ref 6.5–8.1)

## 2018-08-19 LAB — URINALYSIS, ROUTINE W REFLEX MICROSCOPIC
Bilirubin Urine: NEGATIVE
Glucose, UA: NEGATIVE mg/dL
HGB URINE DIPSTICK: NEGATIVE
KETONES UR: 20 mg/dL — AB
Leukocytes, UA: NEGATIVE
Nitrite: NEGATIVE
Protein, ur: NEGATIVE mg/dL
Specific Gravity, Urine: 1.021 (ref 1.005–1.030)
pH: 5 (ref 5.0–8.0)

## 2018-08-19 LAB — CBC WITH DIFFERENTIAL/PLATELET
Abs Immature Granulocytes: 0.06 10*3/uL (ref 0.00–0.07)
Basophils Absolute: 0.1 10*3/uL (ref 0.0–0.1)
Basophils Relative: 0 %
EOS ABS: 0.3 10*3/uL (ref 0.0–0.5)
Eosinophils Relative: 2 %
HEMATOCRIT: 33.4 % — AB (ref 36.0–46.0)
Hemoglobin: 11.5 g/dL — ABNORMAL LOW (ref 12.0–15.0)
IMMATURE GRANULOCYTES: 0 %
Lymphocytes Relative: 34 %
Lymphs Abs: 4.7 10*3/uL — ABNORMAL HIGH (ref 0.7–4.0)
MCH: 28.5 pg (ref 26.0–34.0)
MCHC: 34.4 g/dL (ref 30.0–36.0)
MCV: 82.7 fL (ref 80.0–100.0)
Monocytes Absolute: 1 10*3/uL (ref 0.1–1.0)
Monocytes Relative: 7 %
Neutro Abs: 7.9 10*3/uL — ABNORMAL HIGH (ref 1.7–7.7)
Neutrophils Relative %: 57 %
Platelets: 343 10*3/uL (ref 150–400)
RBC: 4.04 MIL/uL (ref 3.87–5.11)
RDW: 13.2 % (ref 11.5–15.5)
WBC: 13.9 10*3/uL — ABNORMAL HIGH (ref 4.0–10.5)
nRBC: 0 % (ref 0.0–0.2)

## 2018-08-19 LAB — I-STAT TROPONIN, ED: Troponin i, poc: 0 ng/mL (ref 0.00–0.08)

## 2018-08-19 MED ORDER — ONDANSETRON HCL 4 MG/2ML IJ SOLN
4.0000 mg | Freq: Once | INTRAMUSCULAR | Status: AC
  Administered 2018-08-19: 4 mg via INTRAVENOUS
  Filled 2018-08-19: qty 2

## 2018-08-19 MED ORDER — OXYCODONE-ACETAMINOPHEN 5-325 MG PO TABS: 1.0000 | ORAL_TABLET | Freq: Once | ORAL | Status: DC

## 2018-08-19 MED ORDER — SODIUM CHLORIDE 0.9 % IV BOLUS
1000.0000 mL | Freq: Once | INTRAVENOUS | Status: AC
  Administered 2018-08-19: 1000 mL via INTRAVENOUS

## 2018-08-19 NOTE — ED Provider Notes (Signed)
[facility]-EMERGENCY DEPT Provider Note   CSN: [id number] Arrival date & time: 08/19/18  1928     History   Chief Complaint No chief complaint on file.   HPI Sheila Ferguson is a 57 y.o. female with history of diabetes on insulin, neuropathy, obesity, status post laparoscopic sleeve gastrectomy 5 days ago by Dr. Daphine DeutscherMartin is here for evaluation of dizziness.  Onset today.  Sudden.  Described as things "shifting".  Lasting a few minutes.  First episode happened while she was walking laps around her apartment.  Symptoms resolved after a few minutes of sitting down.  Has noticed dizziness when going from laying to sitting and standing.  Admits that she has not had adequate oral intake.  Was told by Dr. Daphine DeutscherMartin to drink 2 ounces of fluid at a time and up to 64 ounces a day but states she has not been able to tolerate this.  She is drinking 10 to 12 ounces of fluids a day.  States drinking makes her abdomen hurt and makes her feel really full.  Today she has had 12 ounces of protein drinks.  She feels she is dehydrated.  Has been taking oxycodone which helps.  Reports ongoing, improving abdominal pain, generalized since his surgery.  This pain is intermittent, worse with eating.  She denies associated chest pain, shortness of breath, palpitations, diaphoresis, syncope, nausea, vomiting, back pain, headache, vision changes.  Additionally reports skin breakdown to inframammary fold bilaterally and bilateral inguinal folds since the surgery.  This is burning, red and moist.  Also notes urination feels "different", no frank dysuria, hematuria, frequency but has some pressure with urination.  She has been using diaper cream on the area.  No fevers.   HPI  Past Medical History:  Diagnosis Date  . Allergy   . Anal fissure   . Anemia   . Anxiety   . Arthritis   . Bradycardia    slow heart rate per pt - unsure of rate   . Congenital stricture of esophagus   . Depression   .  Diabetes mellitus type II   . DIABETES MELLITUS, TYPE II, UNCONTROLLED 09/01/2007   04/10/13 eye exam neg proliferative DM retinopathy, +non proliferative DM retinopathy OS and OD +amblyopia OD   . Dysmetabolic syndrome   . Gall bladder disease    removed  . GERD (gastroesophageal reflux disease)   . Headache(784.0)   . Heart murmur   . Hemorrhoids   . Hypertension   . Neuromuscular disorder (HCC)    ruptured disc in back, burcitis   . Sarcoid   . Sleep apnea    no cpap   . Thyroid disease    no meds needed at this time     Patient Active Problem List   Diagnosis Date Noted  . S/P laparoscopic sleeve gastrectomy Dec 2019 08/14/2018  . Neuropathy 07/14/2018  . 'light-for-dates' infant with signs of fetal malnutrition 03/24/2018  . Need for immunization against influenza 05/24/2017  . Immunity status testing 04/01/2017  . Acute bilateral back pain 11/29/2016  . Severe obesity (BMI >= 40) (HCC) 10/22/2016  . Lumbar radiculopathy, chronic 05/06/2016  . Bilateral thigh pain 10/07/2015  . CKD stage 2 due to type 2 diabetes mellitus (HCC) 09/04/2014  . Health care maintenance 10/05/2013  . Osteoarthritis of both knees 10/05/2013  . Hyperlipidemia 12/25/2012  . Anxiety and depression 12/28/2011  . MIGRAINE HEADACHE 01/13/2008  . GERD 01/13/2008  . Essential hypertension 02/09/2007  . Diabetes  mellitus type 2 with retinopathy (HCC) 08/31/2004    Past Surgical History:  Procedure Laterality Date  . ABDOMINAL HYSTERECTOMY    . CHOLECYSTECTOMY    . COLONOSCOPY  2008   nesxt is 08-07-18  . ESOPHAGEAL DILATION     x2  . LAPAROSCOPIC GASTRIC SLEEVE RESECTION N/A 08/14/2018   Procedure: LAPAROSCOPIC GASTRIC SLEEVE RESECTION, UPPER ENDO, ERAS PATHWAY;  Surgeon: Luretha Murphy, MD;  Location: [facility] ORS;  Service: General;  Laterality: N/A;  . OOPHORECTOMY     right, due to endometriosis  . UPPER GASTROINTESTINAL ENDOSCOPY       OB History   None      Home Medications     Prior to Admission medications   Medication Sig Start Date End Date Taking? Authorizing Provider  acetaminophen (TYLENOL) 500 MG tablet Take 2 tablets (1,000 mg total) by mouth 3 (three) times daily. Patient taking differently: Take 1,000 mg by mouth every 8 (eight) hours as needed for mild pain or headache.  01/13/16  Yes Rivet, Carly J, MD  amLODipine (NORVASC) 10 MG tablet Take 1 tablet (10 mg total) by mouth daily. Patient taking differently: Take 5 mg by mouth daily.  04/13/18  Yes Inez Catalina, MD  citalopram (CELEXA) 40 MG tablet TAKE 1 TABLET BY MOUTH ONCE DAILY Patient taking differently: Take 40 mg by mouth at bedtime.  02/14/18  Yes Ginger Carne, MD  gabapentin (NEURONTIN) 300 MG capsule Take 1 capsule (300 mg total) by mouth every 8 (eight) hours. for 30 days Patient taking differently: Take 300 mg by mouth 2 (two) times daily. for 30 days 08/15/18  Yes Luretha Murphy, MD  lisinopril (PRINIVIL,ZESTRIL) 40 MG tablet TAKE 1 TABLET BY MOUTH ONCE DAILY Patient taking differently: Take 40 mg by mouth daily.  04/11/18  Yes [name], MD  metoprolol tartrate (LOPRESSOR) 100 MG tablet Take 1 tablet (100 mg total) by mouth 2 (two) times daily. 01/04/18  Yes Ginger Carne, MD  ondansetron (ZOFRAN-ODT) 4 MG disintegrating tablet Take 1 tablet (4 mg total) by mouth every 6 (six) hours as needed for nausea or vomiting. 08/15/18  Yes Luretha Murphy, MD  oxyCODONE (ROXICODONE) 5 MG/5ML solution Take 5-10 mLs (5-10 mg total) by mouth every 4 (four) hours as needed for moderate pain or severe pain. 08/15/18  Yes Luretha Murphy, MD  pantoprazole (PROTONIX) 40 MG tablet Take 1 tablet (40 mg total) by mouth daily. 08/15/18  Yes Luretha Murphy, MD  sitaGLIPtin (JANUVIA) 100 MG tablet Take 100 mg by mouth at bedtime.    Yes [provider]  VICTOZA 18 MG/3ML SOPN INJECT 0.3 ML (1.8 MG) INTO THE SKIN DAILY Patient taking differently: Inject 1.8 mg into the skin daily.  05/29/18  Yes [name],  MD  dextrose (RELION GLUCOSE) 40 % GEL Take 37.5 g by mouth once as needed for low blood sugar. 01/23/16   Rivet, Iris Pert, MD  EPINEPHrine 0.3 mg/0.3 mL IJ SOAJ injection Inject 0.3 mLs (0.3 mg total) into the muscle as needed (for anaphylaxis). 12/08/17   Dwana Melena, DO  gabapentin (NEURONTIN) 100 MG capsule Take 3 capsules (300 mg total) by mouth at bedtime. Patient not taking: Reported on 08/19/2018 07/14/18 10/12/18  Claudean Severance, MD  hydrocortisone (ANUSOL-HC) 25 MG suppository Place 1 suppository (25 mg total) rectally 2 (two) times daily. Patient taking differently: Place 25 mg rectally 2 (two) times daily as needed for hemorrhoids.  10/01/15   Rivet, Iris Pert, MD  lovastatin (MEVACOR)  20 MG tablet Take 1 tablet (20 mg total) by mouth at bedtime. Patient not taking: Reported on 08/19/2018 01/16/18   Ginger Carne, MD  meclizine (ANTIVERT) 25 MG tablet Take 1 tablet (25 mg total) by mouth 3 (three) times daily as needed for dizziness. 08/20/18   Liberty Handy, PA-C  omeprazole (PRILOSEC) 40 MG capsule Take 1 capsule (40 mg total) by mouth daily. Patient not taking: Reported on 08/19/2018 01/17/18   Ginger Carne, MD    Family History Family History  Problem Relation Age of Onset  . Sarcoidosis Father   . Leukemia Father   . Hypertension Mother   . Diabetes Mother   . Heart attack Mother   . Aneurysm Mother        CNS  . Breast cancer Mother   . Hypertension Sister   . Diabetes Sister   . Sleep apnea Sister   . Asthma Other   . Cancer Other   . Heart disease Other   . Stroke Other   . Colon cancer Neg Hx   . Colon polyps Neg Hx   . Esophageal cancer Neg Hx   . Rectal cancer Neg Hx   . Stomach cancer Neg Hx     Social History Social History   Tobacco Use  . Smoking status: Never Smoker  . Smokeless tobacco: Never Used  Substance Use Topics  . Alcohol use: No    Alcohol/week: 0.0 standard drinks  . Drug use: No     Allergies   Ceftin [cefuroxime axetil]; Shrimp  [shellfish allergy]; Pineapple; Adhesive [tape]; and Azithromycin   Review of Systems Review of Systems  Gastrointestinal: Positive for abdominal pain.  Genitourinary: Positive for difficulty urinating.  Skin: Positive for color change.  Neurological: Positive for dizziness and light-headedness.  All other systems reviewed and are negative.    Physical Exam Updated Vital Signs BP 137/67 (BP Location: Left Arm)   Pulse 97   Temp 98 F (36.7 C) (Oral)   Resp 17   SpO2 100%   Physical Exam  Constitutional: She is oriented to person, place, and time. She appears well-developed and well-nourished.  Non toxic  HENT:  Head: Normocephalic and atraumatic.  Nose: Nose normal.  Dry lips.  Moist mucous membranes.  Eyes: Pupils are equal, round, and reactive to light. Conjunctivae and EOM are normal.  Neck: Normal range of motion.  Cardiovascular: Normal rate and regular rhythm.  1+ radial and DP pulses bilaterally. No LE edema or tenderness.  Pulmonary/Chest: Effort normal and breath sounds normal.  Abdominal: Soft. Bowel sounds are normal. There is no tenderness.  Multiple surgical incisions noted throughout abdomen, skin adhesive intact.  No erythema, edema, drainage.  Abdomen is soft, nontender.  No G/R/R. No suprapubic or CVA tenderness. Negative Murphy's and McBurney's.  Musculoskeletal: Normal range of motion.  Neurological: She is alert and oriented to person, place, and time.  Skin: Skin is warm and dry. Capillary refill takes less than 2 seconds. Rash noted. There is erythema.  Moist, erythematous, shiny patch in the skin fold of the groin bilaterally and bilateral inframammary folds, mildly tender. No edema, drainage, odor.   Psychiatric: She has a normal mood and affect. Her behavior is normal.  Nursing note and vitals reviewed.    ED Treatments / Results  Labs (all labs ordered are listed, but only abnormal results are displayed) Labs Reviewed  CBC WITH  DIFFERENTIAL/PLATELET - Abnormal; Notable for the following components:      Result Value  WBC 13.9 (*)    Hemoglobin 11.5 (*)    HCT 33.4 (*)    Neutro Abs 7.9 (*)    Lymphs Abs 4.7 (*)    All other components within normal limits  COMPREHENSIVE METABOLIC PANEL - Abnormal; Notable for the following components:   Glucose, Bld 108 (*)    BUN 24 (*)    Creatinine, Ser 1.13 (*)    GFR calc non Af Amer 54 (*)    All other components within normal limits  URINALYSIS, ROUTINE W REFLEX MICROSCOPIC - Abnormal; Notable for the following components:   APPearance HAZY (*)    Ketones, ur 20 (*)    All other components within normal limits  URINE CULTURE  I-STAT TROPONIN, ED    EKG None  Radiology No results found.  Procedures Procedures (including critical care time)  Medications Ordered in ED Medications  oxyCODONE-acetaminophen (PERCOCET/ROXICET) 5-325 MG per tablet 1 tablet (0 tablets Oral Hold 08/19/18 2329)  sodium chloride 0.9 % bolus 1,000 mL (1,000 mLs Intravenous New Bag/Given 08/19/18 2228)  ondansetron (ZOFRAN) injection 4 mg (4 mg Intravenous Given 08/19/18 2229)     Initial Impression / Assessment and Plan / ED Course  I have reviewed the triage vital signs and the nursing notes.  Pertinent labs & imaging results that were available during my care of the patient were reviewed by me and considered in my medical decision making (see chart for details).  Clinical Course as of Aug 21 11  Sat Aug 19, 2018  2204 Creatinine(!): 1.13 [CG]  2204 Ketones, ur(!): 20 [CG]  2204 WBC(!): 13.9 [CG]  Sun Aug 20, 2018  0007 Reevaluated patient.  I personally ambulated her and she had no lightheadedness.  No significant drop in her blood pressure from laying, sitting and standing.   [CG]    Clinical Course User Index [CG] Liberty Handy, PA-C    High suspicion for orthostatic/dehydration vs vertigo.  She reports decreased fluid intake.  She looks slightly dehydrated on  exam.  She has no symptoms to suggest cardiac etiology such as arrhythmia.  She has no chest pain, shortness of breath, tachycardia, tachypnea, clinical exams of a blood clot and I doubt PE.  Her abdominal pain post surgery is improving and abdominal exam today is benign, I doubt intra-abdominal infectious or acute pathology.  Will obtain orthostatics, screening labs, EKG.  We will plan on giving IV fluids.  Reassess.  2358: EKG without ischemic changes, or arrhythmias.  Creatinine slightly elevated, ketones in her urine.  Positive orthostatic vital signs.  Work-up today suggests symptoms may be from dehydration.  Patient received IV fluids, nausea medicine in her home pain medications for postop pain.  She ambulated and walked around in the ER with improvement in her symptoms.  We will discharge after completion of IVF with oral hydration and meclizine as she also reports head turning, "shifting" and spinning sensation for possible vertigo as well.  Return precautions given.  Patient is in agreement with this. Final Clinical Impressions(s) / ED Diagnoses   Final diagnoses:  Dizziness    ED Discharge Orders         Ordered    meclizine (ANTIVERT) 25 MG tablet  3 times daily PRN     08/20/18 0011           Liberty Handy, PA-C 08/20/18 Lorin Picket    Azalia Bilis, MD 08/22/18 [phone]

## 2018-08-19 NOTE — ED Triage Notes (Signed)
Pt presents with lightheadedness and dizziness that started today.  Pt had the gastric sleeve on Monday and has been constantly cold since then.  Pt has pain at her abd but states that has been the same since her surgery. Pt a/o x 4 and ambulatory.

## 2018-08-20 MED ORDER — MECLIZINE HCL 25 MG PO TABS: 25.0000 mg | ORAL_TABLET | Freq: Three times a day (TID) | ORAL | 0 refills | Status: DC | PRN

## 2018-08-20 NOTE — Discharge Instructions (Signed)
You were seen in the ER for lightheadedness with sitting up and standing up.  Work-up today suggest you are slightly dehydrated.  Try to drink as much fluids as tolerated to stay hydrated.  Continue all your postop medications as prescribed by Dr. Daphine DeutscherMartin.  Return to the ER for worsening symptoms, palpitations, chest pain, shortness of breath, passing out.

## 2018-08-21 ENCOUNTER — Encounter: Payer: Self-pay | Admitting: Internal Medicine

## 2018-08-21 ENCOUNTER — Telehealth (HOSPITAL_COMMUNITY): Payer: Self-pay

## 2018-08-21 ENCOUNTER — Ambulatory Visit: Payer: Self-pay | Admitting: Surgery

## 2018-08-21 DIAGNOSIS — E860 Dehydration: Secondary | ICD-10-CM

## 2018-08-21 LAB — URINE CULTURE: Culture: 70000 — AB

## 2018-08-21 MED ORDER — LACTATED RINGERS IV BOLUS: 1000.0000 mL | Freq: Once | INTRAVENOUS | Status: DC

## 2018-08-21 MED ORDER — LACTATED RINGERS IV SOLN: INTRAVENOUS | Status: DC

## 2018-08-21 NOTE — Telephone Encounter (Signed)
Patient called to discuss post bariatric surgery follow up questions.  See below:   1.  Tell me about your pain and pain management?abdominal cramping with fluid intake  2.  Let's talk about fluid intake.  How much total fluid are you taking in?38 ounces  3.  How much protein have you taken in the last 2 days?60  4.  Have you had nausea?  Tell me about when have experienced nausea and what you did to help?no nausea was in the beginning  5.  Has the frequency or color changed with your urine?urine clear but dizziness last night  6.  Tell me what your incisions look like? Look good 7.  Have you been passing gas? BM?passing gas had bm  8.  If a problem or question were to arise who would you call?  Do you know contact numbers for [facility], [facility], and [facility]?aware of how to contact all resources  9.  How has the walking going?walking around without difficulty  10.  How are your vitamins and calcium going?  How are you taking them?started mvi and calcium  Appointment with PCP tomorrow, still dizziness at times will talk to Dr Daphine DeutscherMartin about hydration.  Patient visited ED due to dehydration 08/19/18

## 2018-08-22 ENCOUNTER — Ambulatory Visit (INDEPENDENT_AMBULATORY_CARE_PROVIDER_SITE_OTHER): Payer: BLUE CROSS/BLUE SHIELD | Admitting: Internal Medicine

## 2018-08-22 ENCOUNTER — Encounter: Payer: Self-pay | Admitting: Internal Medicine

## 2018-08-22 ENCOUNTER — Telehealth: Payer: Self-pay | Admitting: Emergency Medicine

## 2018-08-22 VITALS — BP 129/66 | HR 89 | Temp 97.8°F | Wt 264.6 lb

## 2018-08-22 DIAGNOSIS — Z23 Encounter for immunization: Secondary | ICD-10-CM

## 2018-08-22 DIAGNOSIS — Z9884 Bariatric surgery status: Secondary | ICD-10-CM

## 2018-08-22 DIAGNOSIS — Z79899 Other long term (current) drug therapy: Secondary | ICD-10-CM

## 2018-08-22 DIAGNOSIS — E113599 Type 2 diabetes mellitus with proliferative diabetic retinopathy without macular edema, unspecified eye: Secondary | ICD-10-CM

## 2018-08-22 DIAGNOSIS — Z794 Long term (current) use of insulin: Secondary | ICD-10-CM

## 2018-08-22 DIAGNOSIS — Z6841 Body Mass Index (BMI) 40.0 and over, adult: Secondary | ICD-10-CM | POA: Diagnosis not present

## 2018-08-22 DIAGNOSIS — E11319 Type 2 diabetes mellitus with unspecified diabetic retinopathy without macular edema: Secondary | ICD-10-CM

## 2018-08-22 DIAGNOSIS — K219 Gastro-esophageal reflux disease without esophagitis: Secondary | ICD-10-CM | POA: Diagnosis not present

## 2018-08-22 DIAGNOSIS — Z7984 Long term (current) use of oral hypoglycemic drugs: Secondary | ICD-10-CM

## 2018-08-22 DIAGNOSIS — E669 Obesity, unspecified: Secondary | ICD-10-CM

## 2018-08-22 NOTE — Patient Instructions (Addendum)
Sheila Ferguson  Thank you for coming in. I am very glad that you are recovering well from your surgery. Please ensure you have close follow up with your surgeon and we will meet again in 3 months to discuss reducing your medications.  Pneumococcal Vaccine, Polyvalent solution for injection What is this medicine? PNEUMOCOCCAL VACCINE, POLYVALENT (NEU mo KOK al vak SEEN, pol ee VEY luhnt) is a vaccine to prevent pneumococcus bacteria infection. These bacteria are a major cause of ear infections, Strep throat infections, and serious pneumonia, meningitis, or blood infections worldwide. These vaccines help the body to produce antibodies (protective substances) that help your body defend against these bacteria. This vaccine is recommended for people 20 years of age and older with health problems. It is also recommended for all adults over 45 years old. This vaccine will not treat an infection. This medicine may be used for other purposes; ask your health care provider or pharmacist if you have questions. COMMON BRAND NAME(S): Pneumovax 23 What should I tell my health care provider before I take this medicine? They need to know if you have any of these conditions: -bleeding problems -bone marrow or organ transplant -cancer, Hodgkin's disease -fever -infection -immune system problems -low platelet count in the blood -seizures -an unusual or allergic reaction to pneumococcal vaccine, diphtheria toxoid, other vaccines, latex, other medicines, foods, dyes, or preservatives -pregnant or trying to get pregnant -breast-feeding How should I use this medicine? This vaccine is for injection into a muscle or under the skin. It is given by a health care professional. A copy of Vaccine Information Statements will be given before each vaccination. Read this sheet carefully each time. The sheet may change frequently. Talk to your pediatrician regarding the use of this medicine in children. While this drug may be  prescribed for children as young as 89 years of age for selected conditions, precautions do apply. Overdosage: If you think you have taken too much of this medicine contact a poison control center or emergency room at once. NOTE: This medicine is only for you. Do not share this medicine with others. What if I miss a dose? It is important not to miss your dose. Call your doctor or health care professional if you are unable to keep an appointment. What may interact with this medicine? -medicines for cancer chemotherapy -medicines that suppress your immune function -medicines that treat or prevent blood clots like warfarin, enoxaparin, and dalteparin -steroid medicines like prednisone or cortisone This list may not describe all possible interactions. Give your health care provider a list of all the medicines, herbs, non-prescription drugs, or dietary supplements you use. Also tell them if you smoke, drink alcohol, or use illegal drugs. Some items may interact with your medicine. What should I watch for while using this medicine? Mild fever and pain should go away in 3 days or less. Report any unusual symptoms to your doctor or health care professional. What side effects may I notice from receiving this medicine? Side effects that you should report to your doctor or health care professional as soon as possible: -allergic reactions like skin rash, itching or hives, swelling of the face, lips, or tongue -breathing problems -confused -fever over 102 degrees F -pain, tingling, numbness in the hands or feet -seizures -unusual bleeding or bruising -unusual muscle weakness Side effects that usually do not require medical attention (report to your doctor or health care professional if they continue or are bothersome): -aches and pains -diarrhea -fever of 102 degrees F or  less -headache -irritable -loss of appetite -pain, tender at site where injected -trouble sleeping This list may not describe  all possible side effects. Call your doctor for medical advice about side effects. You may report side effects to FDA at 1-800-FDA-1088. Where should I keep my medicine? This does not apply. This vaccine is given in a clinic, pharmacy, doctor's office, or other health care setting and will not be stored at home. NOTE: This sheet is a summary. It may not cover all possible information. If you have questions about this medicine, talk to your doctor, pharmacist, or health care provider.  2018 Elsevier/Gold Standard (2008-04-05 14:32:37)

## 2018-08-22 NOTE — Telephone Encounter (Signed)
Post ED Visit - Positive Culture Follow-up  Culture report reviewed by antimicrobial stewardship pharmacist:  []  Enzo BiNathan Batchelder, Pharm.D. []  Celedonio MiyamotoJeremy Frens, Pharm.D., BCPS AQ-ID []  Garvin FilaMike Maccia, Pharm.D., BCPS []  Georgina PillionElizabeth Martin, [address]Pharm[address]D., BCPS []  GrainolaMinh Pham, Vermont[place]arm.D., BCPS, AAHIVP []  Estella HuskMichelle Turner, Pharm.D., BCPS, AAHIVP [x]  Lysle Pearlachel Rumbarger, PharmD, BCPS []  Phillips Climeshuy Dang, PharmD, BCPS []  Agapito GamesAlison Masters, PharmD, BCPS []  Verlan FriendsErin Deja, PharmD  Positive urine culture Treated with none, likely contaminant,  no further patient follow-up is required at this time.  Berle MullMiller, Sheila Ferguson 08/22/2018, 11:52 AM

## 2018-08-24 ENCOUNTER — Encounter: Payer: Self-pay | Admitting: Internal Medicine

## 2018-08-24 ENCOUNTER — Ambulatory Visit (HOSPITAL_COMMUNITY)
Admission: RE | Admit: 2018-08-24 | Discharge: 2018-08-24 | Disposition: A | Discharge status: Home / Self Care | Payer: BLUE CROSS/BLUE SHIELD | Source: Ambulatory Visit | Attending: Surgery | Admitting: Surgery

## 2018-08-24 DIAGNOSIS — E869 Volume depletion, unspecified: Secondary | ICD-10-CM | POA: Insufficient documentation

## 2018-08-24 DIAGNOSIS — Z23 Encounter for immunization: Secondary | ICD-10-CM | POA: Insufficient documentation

## 2018-08-24 MED ORDER — LACTATED RINGERS IV BOLUS
1000.0000 mL | Freq: Once | INTRAVENOUS | Status: AC
  Administered 2018-08-24: 1000 mL via INTRAVENOUS

## 2018-08-24 NOTE — Discharge Instructions (Signed)
Dehydration, Adult Dehydration is when there is not enough fluid or water in your body. This happens when you lose more fluids than you take in. Dehydration can range from mild to very bad. It should be treated right away to keep it from getting very bad. Symptoms of mild dehydration may include:  Thirst.  Dry lips.  Slightly dry mouth.  Dry, warm skin.  Dizziness. Symptoms of moderate dehydration may include:  Very dry mouth.  Muscle cramps.  Dark pee (urine). Pee may be the color of tea.  Your body making less pee.  Your eyes making fewer tears.  Heartbeat that is uneven or faster than normal (palpitations).  Headache.  Light-headedness, especially when you stand up from sitting.  Fainting (syncope). Symptoms of very bad dehydration may include:  Changes in skin, such as: ? Cold and clammy skin. ? Blotchy (mottled) or pale skin. ? Skin that does not quickly return to normal after being lightly pinched and let go (poor skin turgor).  Changes in body fluids, such as: ? Feeling very thirsty. ? Your eyes making fewer tears. ? Not sweating when body temperature is high, such as in hot weather. ? Your body making very little pee.  Changes in vital signs, such as: ? Weak pulse. ? Pulse that is more than 100 beats a minute when you are sitting still. ? Fast breathing. ? Low blood pressure.  Other changes, such as: ? Sunken eyes. ? Cold hands and feet. ? Confusion. ? Lack of energy (lethargy). ? Trouble waking up from sleep. ? Short-term weight loss. ? Unconsciousness. Follow these instructions at home:  If told by your doctor, drink an ORS: ? Make an ORS by using instructions on the package. ? Start by drinking small amounts, about  cup (120 mL) every 5-10 minutes. ? Slowly drink more until you have had the amount that your doctor said to have.  Drink enough clear fluid to keep your pee clear or pale yellow. If you were told to drink an ORS, finish the ORS  first, then start slowly drinking clear fluids. Drink fluids such as: ? Water. Do not drink only water by itself. Doing that can make the salt (sodium) level in your body get too low (hyponatremia). ? Ice chips. ? Fruit juice that you have added water to (diluted). ? Low-calorie sports drinks.  Avoid: ? Alcohol. ? Drinks that have a lot of sugar. These include high-calorie sports drinks, fruit juice that does not have water added, and soda. ? Caffeine. ? Foods that are greasy or have a lot of fat or sugar.  Take over-the-counter and prescription medicines only as told by your doctor.  Do not take salt tablets. Doing that can make the salt level in your body get too high (hypernatremia).  Eat foods that have minerals (electrolytes). Examples include bananas, oranges, potatoes, tomatoes, and spinach.  Keep all follow-up visits as told by your doctor. This is important. Contact a doctor if:  You have belly (abdominal) pain that: ? Gets worse. ? Stays in one area (localizes).  You have a rash.  You have a stiff neck.  You get angry or annoyed more easily than normal (irritability).  You are more sleepy than normal.  You have a harder time waking up than normal.  You feel: ? Weak. ? Dizzy. ? Very thirsty.  You have peed (urinated) only a small amount of very dark pee during 6-8 hours. Get help right away if:  You have symptoms of   very bad dehydration.  You cannot drink fluids without throwing up (vomiting).  Your symptoms get worse with treatment.  You have a fever.  You have a very bad headache.  You are throwing up or having watery poop (diarrhea) and it: ? Gets worse. ? Does not go away.  You have blood or something green (bile) in your throw-up.  You have blood in your poop (stool). This may cause poop to look black and tarry.  You have not peed in 6-8 hours.  You pass out (faint).  Your heart rate when you are sitting still is more than 100 beats a  minute.  You have trouble breathing. This information is not intended to replace advice given to you by your health care provider. Make sure you discuss any questions you have with your health care provider. Document Released: 06/26/2009 Document Revised: 03/19/2016 Document Reviewed: 10/24/2015 Elsevier Interactive Patient Education  2018 [employer] Inc[employer]  

## 2018-08-24 NOTE — Assessment & Plan Note (Signed)
Ms.Glaeser glucometer readings were reviewed for 30 day period from 07/24/18 to 08/22/18. Average reading was 153. 5 episodes of hyperglycemia above 180 post op, and 0 episodes of hypoglycemia recorded. Her last a1c checked was on 07/14/18 at 9.0, which had worsened from 7.9. She has stopped taking her Lantus 33 units and has been only taking her metformin, victoza and Januvia. Symptoms of hypoglycemia were reviewed with her with recommendation to keep close eye on her glucometer as she may become hypoglycemic after bariatric surgery although her post-op sugars have been between 120-160s.   - C/w metformin, victoza, januvia - C/w gabapentin 300mg  qhs

## 2018-08-24 NOTE — Progress Notes (Signed)
CC: Diabetes Type 2  HPI: Sheila Ferguson is a 57 y.o. F w/ PMH of obesity, T2DM, and GERD presenting for 3 month follow up visit for management of her diabetes. She underwent bariatric surgery on 08/14/2018 (sleeve gastrectomy) and has been recovering well since her surgery. Currently on liquid diet with instructions to progress to pureed diet. She states she has been feeling well with no issues with urination, defecation. Denies any F/N/V. She states she has not noticed any significant changes in her glucometer readings since the surgery but definitely has been eating less due to being on liquid diet. She states she is very hungry and is excited to start advancing her diet. She has a hydration study scheduled on 08/24/18. Denies any hypoglycemic episodes since her surgery and is continuing to take her home medications as prescribed except for the insulin.  Past Medical History:  Diagnosis Date  . Allergy   . Anal fissure   . Anemia   . Anxiety   . Arthritis   . Bradycardia    slow heart rate per pt - unsure of rate   . Congenital stricture of esophagus   . Depression   . Diabetes mellitus type II   . DIABETES MELLITUS, TYPE II, UNCONTROLLED 09/01/2007   04/10/13 eye exam neg proliferative DM retinopathy, +non proliferative DM retinopathy OS and OD +amblyopia OD   . Dysmetabolic syndrome   . Gall bladder disease    removed  . GERD (gastroesophageal reflux disease)   . Headache(784.0)   . Heart murmur   . Hemorrhoids   . Hypertension   . Neuromuscular disorder (HCC)    ruptured disc in back, burcitis   . Sarcoid   . Sleep apnea    no cpap   . Thyroid disease    no meds needed at this time     Review of Systems: Review of Systems  Constitutional: Positive for weight loss. Negative for chills, fever and malaise/fatigue.  Cardiovascular: Negative for chest pain, palpitations and orthopnea.  Gastrointestinal: Negative for abdominal pain, constipation, diarrhea, nausea and  vomiting.  Genitourinary: Negative for dysuria, frequency and urgency.  Neurological: Negative for tremors, weakness and headaches.     Physical Exam: Vitals:   08/22/18 1515  BP: 129/66  Pulse: 89  Temp: 97.8 F (36.6 C)  TempSrc: Oral  SpO2: 100%  Weight: 264 lb 9.6 oz (120 kg)    Physical Exam  Constitutional: She is oriented to person, place, and time. She appears well-developed and well-nourished. No distress.  obese  HENT:  Head: Normocephalic and atraumatic.  Mouth/Throat: Oropharynx is clear and moist. No oropharyngeal exudate.  Eyes: Pupils are equal, round, and reactive to light. Conjunctivae and EOM are normal. No scleral icterus.  Neck: Normal range of motion. Neck supple. No JVD present.  Cardiovascular: Normal rate, regular rhythm, normal heart sounds and intact distal pulses.  No murmur heard. Respiratory: Effort normal and breath sounds normal. She has no rales.  GI: Soft. Bowel sounds are normal. There is no abdominal tenderness.  laparascopic surgical site with good healing. No erythema, bloody exudates, surrounding edema or warmth  Musculoskeletal: Normal range of motion.        General: No edema.  Neurological: She is alert and oriented to person, place, and time. She has normal reflexes. No cranial nerve deficit.  Skin: Skin is warm and dry.     Assessment & Plan:   Diabetes mellitus type 2 with retinopathy Sheila Ferguson glucometer readings were reviewed  for 30 day period from 07/24/18 to 08/22/18. Average reading was 153. 5 episodes of hyperglycemia above 180 post op, and 0 episodes of hypoglycemia recorded. Her last a1c checked was on 07/14/18 at 9.0, which had worsened from 7.9. She has stopped taking her Lantus 33 units and has been only taking her metformin, victoza and Januvia. Symptoms of hypoglycemia were reviewed with her with recommendation to keep close eye on her glucometer as she may become hypoglycemic after bariatric surgery although her post-op  sugars have been between 120-160s.   - C/w metformin, victoza, januvia - C/w gabapentin 300mg  qhs  Need for 23-polyvalent pneumococcal polysaccharide vaccine Ms.Zell agrees to take Pneumovar 23 shot today during this visit.   Patient seen with Dr. Cleda Daub   -Sheila Ferguson, PGY1

## 2018-08-24 NOTE — Progress Notes (Signed)
Patient received via 1 L. of Lactated Ringers via a PIV as ordered. Tolerated well, vitals stable, discharge instructions given, verbalized understanding. Patient alert, oriented and ambulatory at the time of discharge.

## 2018-08-24 NOTE — Assessment & Plan Note (Signed)
Sheila Ferguson agrees to take Pneumovar 23 shot today during this visit.

## 2018-08-28 NOTE — Progress Notes (Signed)
Internal Medicine Clinic Attending  I saw and evaluated the patient.  I personally confirmed the key portions of the history and exam documented by Dr. Nedra HaiLee and I reviewed pertinent patient test results.  The assessment, diagnosis, and plan were formulated together and I agree with the documentation in the resident's note.   I personally reviewed the CGM data, the resident's interpretation, and agree with the intervention.

## 2018-08-29 ENCOUNTER — Encounter: Payer: BLUE CROSS/BLUE SHIELD | Attending: Surgery | Admitting: Skilled Nursing Facility1

## 2018-08-29 DIAGNOSIS — N182 Chronic kidney disease, stage 2 (mild): Secondary | ICD-10-CM

## 2018-08-29 DIAGNOSIS — E1122 Type 2 diabetes mellitus with diabetic chronic kidney disease: Secondary | ICD-10-CM

## 2018-08-30 ENCOUNTER — Encounter: Payer: Self-pay | Admitting: Skilled Nursing Facility1

## 2018-08-30 NOTE — Progress Notes (Signed)
Bariatric Class:  Appt start time: 1530 end time:  1630.  2 Week Post-Operative Nutrition Class  Patient was seen on 08/29/2018 for Post-Operative Nutrition education at [facility].   Pt has been to the rehydration clinic twice since surgery. Pt reports having a total of 11 ounces of fluid and 30 grams of protein today. Pt reports taking in an average of 20-40 ounces in a day. Pt became dizzy and had to sit down stating she has been "running" all day and it has caught up with her. Dietitian heavily recommended she go to the ED for rehydration, pt refused the ED and stated no no I am fine. Dietitian advised she drink water throughout the class-Pt drank about 16 ounces of water during the class. Dietitian advised she call her surgeon tomorrow for the rehydration clinic if she continues to feel lightheaded and dizzy and consuming under 40 ounces of fluid by 2pm.  Pt was under the impression the only fluid she was allowed to drink was water once properlly educated on being able to drink anything with no carbonation and no sugar pt states she will be able to reach her gola of 64 fluid ounces.  Dietitian will call pt Thursday to evaluate pts fluid consumption.    Surgery date:  Surgery type: Sleeve Start weight at [facility]: 280 Weight today: pt declined  TANITA  BODY COMP RESULTS  Pt declined   BMI (kg/m^2)    Fat Mass (lbs)    Fat Free Mass (lbs)    Total Body Water (lbs)    The following the learning objectives were met by the patient during this course:  Identifies Phase 3A (Soft, High Proteins) Dietary Goals and will begin from 2 weeks post-operatively to 2 months post-operatively  Identifies appropriate sources of fluids and proteins   States protein recommendations and appropriate sources post-operatively  Identifies the need for appropriate texture modifications, mastication, and bite sizes when consuming solids  Identifies appropriate multivitamin and  calcium sources post-operatively  Describes the need for physical activity post-operatively and will follow MD recommendations  States when to call healthcare provider regarding medication questions or post-operative complications  Handouts given during class include:  Phase 3A: Soft, High Protein Diet Handout  Follow-Up Plan: Patient will follow-up at [facility] in 6 weeks for 2 month post-op nutrition visit for diet advancement per MD.    

## 2018-08-31 ENCOUNTER — Other Ambulatory Visit: Payer: Self-pay | Admitting: Obstetrics and Gynecology

## 2018-08-31 ENCOUNTER — Telehealth: Payer: Self-pay | Admitting: Skilled Nursing Facility1

## 2018-08-31 DIAGNOSIS — R928 Other abnormal and inconclusive findings on diagnostic imaging of breast: Secondary | ICD-10-CM

## 2018-08-31 NOTE — Telephone Encounter (Signed)
Dietitian called pt to check on her fluid status.   Pt states she is currently finishing her second 32 ounces of Gatorade and 2-3 cups of ice

## 2018-09-04 ENCOUNTER — Ambulatory Visit
Admission: RE | Admit: 2018-09-04 | Discharge: 2018-09-04 | Disposition: A | Discharge status: Home / Self Care | Payer: BLUE CROSS/BLUE SHIELD | Source: Ambulatory Visit | Attending: Obstetrics and Gynecology | Admitting: Obstetrics and Gynecology

## 2018-09-04 ENCOUNTER — Telehealth: Payer: Self-pay | Admitting: Skilled Nursing Facility1

## 2018-09-04 ENCOUNTER — Other Ambulatory Visit: Payer: Self-pay | Admitting: Obstetrics and Gynecology

## 2018-09-04 DIAGNOSIS — R928 Other abnormal and inconclusive findings on diagnostic imaging of breast: Secondary | ICD-10-CM

## 2018-09-04 DIAGNOSIS — N6310 Unspecified lump in the right breast, unspecified quadrant: Secondary | ICD-10-CM

## 2018-09-04 NOTE — Telephone Encounter (Signed)
RD called pt to verify fluid intake once starting soft, solid proteins 2 week post-bariatric surgery.   Daily Fluid intake: Daily Protein intake:  Concerns/issues:   LVM 

## 2018-09-12 ENCOUNTER — Ambulatory Visit
Admission: RE | Admit: 2018-09-12 | Discharge: 2018-09-12 | Disposition: A | Discharge status: Home / Self Care | Payer: BLUE CROSS/BLUE SHIELD | Source: Ambulatory Visit | Attending: Obstetrics and Gynecology | Admitting: Obstetrics and Gynecology

## 2018-09-12 ENCOUNTER — Other Ambulatory Visit: Payer: Self-pay | Admitting: Obstetrics and Gynecology

## 2018-09-12 DIAGNOSIS — N6310 Unspecified lump in the right breast, unspecified quadrant: Secondary | ICD-10-CM

## 2018-09-23 ENCOUNTER — Other Ambulatory Visit: Payer: Self-pay

## 2018-09-23 ENCOUNTER — Encounter (HOSPITAL_COMMUNITY): Payer: Self-pay | Admitting: Emergency Medicine

## 2018-09-23 ENCOUNTER — Emergency Department (HOSPITAL_COMMUNITY)
Admission: EM | Admit: 2018-09-23 | Discharge: 2018-09-24 | Disposition: A | Discharge status: Home / Self Care | Payer: BLUE CROSS/BLUE SHIELD | Attending: Emergency Medicine | Admitting: Emergency Medicine

## 2018-09-23 DIAGNOSIS — I129 Hypertensive chronic kidney disease with stage 1 through stage 4 chronic kidney disease, or unspecified chronic kidney disease: Secondary | ICD-10-CM | POA: Diagnosis not present

## 2018-09-23 DIAGNOSIS — Z79899 Other long term (current) drug therapy: Secondary | ICD-10-CM | POA: Diagnosis not present

## 2018-09-23 DIAGNOSIS — K649 Unspecified hemorrhoids: Secondary | ICD-10-CM | POA: Diagnosis not present

## 2018-09-23 DIAGNOSIS — E119 Type 2 diabetes mellitus without complications: Secondary | ICD-10-CM | POA: Insufficient documentation

## 2018-09-23 DIAGNOSIS — K5900 Constipation, unspecified: Secondary | ICD-10-CM

## 2018-09-23 DIAGNOSIS — Z7984 Long term (current) use of oral hypoglycemic drugs: Secondary | ICD-10-CM | POA: Diagnosis not present

## 2018-09-23 DIAGNOSIS — E876 Hypokalemia: Secondary | ICD-10-CM | POA: Diagnosis not present

## 2018-09-23 DIAGNOSIS — N182 Chronic kidney disease, stage 2 (mild): Secondary | ICD-10-CM | POA: Insufficient documentation

## 2018-09-23 LAB — URINALYSIS, ROUTINE W REFLEX MICROSCOPIC
BACTERIA UA: NONE SEEN
Bilirubin Urine: NEGATIVE
Glucose, UA: NEGATIVE mg/dL
Hgb urine dipstick: NEGATIVE
Ketones, ur: 5 mg/dL — AB
Leukocytes, UA: NEGATIVE
Nitrite: NEGATIVE
Protein, ur: NEGATIVE mg/dL
Specific Gravity, Urine: 1.01 (ref 1.005–1.030)
pH: 7 (ref 5.0–8.0)

## 2018-09-23 LAB — COMPREHENSIVE METABOLIC PANEL
ALT: 22 U/L (ref 0–44)
AST: 22 U/L (ref 15–41)
Albumin: 3.7 g/dL (ref 3.5–5.0)
Alkaline Phosphatase: 86 U/L (ref 38–126)
Anion gap: 10 (ref 5–15)
BUN: 14 mg/dL (ref 6–20)
CO2: 27 mmol/L (ref 22–32)
Calcium: 9.1 mg/dL (ref 8.9–10.3)
Chloride: 106 mmol/L (ref 98–111)
Creatinine, Ser: 0.96 mg/dL (ref 0.44–1.00)
GFR calc Af Amer: >60 mL/min (ref >60)
GFR calc non Af Amer: >60 mL/min (ref >60)
Glucose, Bld: 183 mg/dL — ABNORMAL HIGH (ref 70–99)
Potassium: 2.9 mmol/L — ABNORMAL LOW (ref 3.5–5.1)
SODIUM: 143 mmol/L (ref 135–145)
Total Bilirubin: 0.3 mg/dL (ref 0.3–1.2)
Total Protein: 7.6 g/dL (ref 6.5–8.1)

## 2018-09-23 LAB — CBC
HCT: 37.3 % (ref 36.0–46.0)
HEMOGLOBIN: 12.7 g/dL (ref 12.0–15.0)
MCH: 27.9 pg (ref 26.0–34.0)
MCHC: 34 g/dL (ref 30.0–36.0)
MCV: 81.8 fL (ref 80.0–100.0)
Platelets: 343 10*3/uL (ref 150–400)
RBC: 4.56 MIL/uL (ref 3.87–5.11)
RDW: 13.2 % (ref 11.5–15.5)
WBC: 9.4 10*3/uL (ref 4.0–10.5)
nRBC: 0 % (ref 0.0–0.2)

## 2018-09-23 LAB — POC OCCULT BLOOD, ED: FECAL OCCULT BLD: POSITIVE — AB

## 2018-09-23 LAB — LIPASE, BLOOD: Lipase: 82 U/L — ABNORMAL HIGH (ref 11–51)

## 2018-09-23 MED ORDER — POTASSIUM CHLORIDE CRYS ER 20 MEQ PO TBCR
40.0000 meq | EXTENDED_RELEASE_TABLET | Freq: Once | ORAL | Status: AC
  Administered 2018-09-24: 40 meq via ORAL
  Filled 2018-09-23: qty 2

## 2018-09-23 MED ORDER — SODIUM CHLORIDE 0.9 % IV BOLUS
1000.0000 mL | Freq: Once | INTRAVENOUS | Status: AC
  Administered 2018-09-23: 1000 mL via INTRAVENOUS

## 2018-09-23 MED ORDER — HYDROCORTISONE ACETATE 25 MG RE SUPP: 25.0000 mg | Freq: Two times a day (BID) | RECTAL | 0 refills | Status: DC

## 2018-09-23 MED ORDER — LACTULOSE 10 GM/15ML PO SOLN: 10.0000 g | Freq: Every day | ORAL | 0 refills | Status: DC | PRN

## 2018-09-23 MED ORDER — LIDOCAINE HCL URETHRAL/MUCOSAL 2 % EX GEL
1.0000 "application " | Freq: Once | CUTANEOUS | Status: AC
  Administered 2018-09-23: 1 via TOPICAL
  Filled 2018-09-23: qty 5

## 2018-09-23 MED ORDER — LORAZEPAM 2 MG/ML IJ SOLN
1.0000 mg | Freq: Once | INTRAMUSCULAR | Status: AC
  Administered 2018-09-23: 1 mg via INTRAVENOUS
  Filled 2018-09-23: qty 1

## 2018-09-23 MED ORDER — LIDOCAINE HCL URETHRAL/MUCOSAL 2 % EX GEL
1.0000 "application " | Freq: Once | CUTANEOUS | Status: AC
  Administered 2018-09-23: 1
  Filled 2018-09-23: qty 5

## 2018-09-23 MED ORDER — SORBITOL 70 % SOLN
960.0000 mL | TOPICAL_OIL | Freq: Once | ORAL | Status: AC
  Administered 2018-09-23: 960 mL via RECTAL
  Filled 2018-09-23: qty 473

## 2018-09-23 NOTE — ED Triage Notes (Addendum)
Patient states she had the sleeve surgery on Dec. 2, 2019. Patient states that she is having constipation. Patient states that she is bleeding from the rectum. Patient also states that she is not urinating. Patient is also complaining of abdominal pain.

## 2018-09-23 NOTE — ED Provider Notes (Signed)
[facility]-EMERGENCY DEPT Provider Note   CSN: [id number] Arrival date & time: 09/23/18  1904     History   Chief Complaint Chief Complaint  Patient presents with  . Rectal Bleeding  . Urinary Retention  . Abdominal Pain    HPI Sheila Ferguson is a 58 y.o. female.  Pt presents to the ED today with constipation and urinary retention.  Pt said she had gastric sleeve surgery on 12/2.  She has not taken any pain meds for weeks.  She was on bariatric vitamins, but that was recently changed to multivitamins.  She has noticed constipation since then.  She also said she can't urinate and has not urinated since this morning.  Pt has seen blood in her stool and knows she has hemorrhoids.  She has tried a stool softener to help her have a bowel movement.  No laxatives.  She has applied hemorrhoid cream to the hemorrhoids.     Past Medical History:  Diagnosis Date  . Allergy   . Anal fissure   . Anemia   . Anxiety   . Arthritis   . Bradycardia    slow heart rate per pt - unsure of rate   . Congenital stricture of esophagus   . Depression   . Diabetes mellitus type II   . DIABETES MELLITUS, TYPE II, UNCONTROLLED 09/01/2007   04/10/13 eye exam neg proliferative DM retinopathy, +non proliferative DM retinopathy OS and OD +amblyopia OD   . Dysmetabolic syndrome   . Gall bladder disease    removed  . GERD (gastroesophageal reflux disease)   . Headache(784.0)   . Heart murmur   . Hemorrhoids   . Hypertension   . Neuromuscular disorder (HCC)    ruptured disc in back, burcitis   . Sarcoid   . Sleep apnea    no cpap   . Thyroid disease    no meds needed at this time     Patient Active Problem List   Diagnosis Date Noted  . Need for 23-polyvalent pneumococcal polysaccharide vaccine 08/24/2018  . Dehydration 08/21/2018  . S/P laparoscopic sleeve gastrectomy Dec 2019 08/14/2018  . Peripheral neuropathy 07/14/2018  . 'light-for-dates' infant with signs of  fetal malnutrition 03/24/2018  . Immunity status testing 04/01/2017  . Severe obesity (BMI >= 40) (HCC) 10/22/2016  . Lumbar radiculopathy, chronic 05/06/2016  . CKD stage 2 due to type 2 diabetes mellitus (HCC) 09/04/2014  . Health care maintenance 10/05/2013  . Osteoarthritis of both knees 10/05/2013  . Hyperlipidemia 12/25/2012  . Anxiety and depression 12/28/2011  . MIGRAINE HEADACHE 01/13/2008  . GERD 01/13/2008  . Essential hypertension 02/09/2007  . Diabetes mellitus type 2 with retinopathy (HCC) 08/31/2004    Past Surgical History:  Procedure Laterality Date  . ABDOMINAL HYSTERECTOMY    . CHOLECYSTECTOMY    . COLONOSCOPY  2008   nesxt is 08-07-18  . ESOPHAGEAL DILATION     x2  . LAPAROSCOPIC GASTRIC SLEEVE RESECTION N/A 08/14/2018   Procedure: LAPAROSCOPIC GASTRIC SLEEVE RESECTION, UPPER ENDO, ERAS PATHWAY;  Surgeon: Luretha Murphy, MD;  Location: [facility]S;  Service: General;  Laterality: N/A;  . OOPHORECTOMY     right, due to endometriosis  . UPPER GASTROINTESTINAL ENDOSCOPY       OB History   No obstetric history on file.      Home Medications    Prior to Admission medications   Medication Sig Start Date End Date Taking? Authorizing Provider  acetaminophen (TYLENOL) 500 MG  tablet Take 2 tablets (1,000 mg total) by mouth 3 (three) times daily. Patient taking differently: Take 1,000 mg by mouth every 8 (eight) hours as needed for mild pain or headache.  01/13/16   Rivet, Iris Pert, MD  amLODipine (NORVASC) 10 MG tablet Take 1 tablet (10 mg total) by mouth daily. Patient taking differently: Take 5 mg by mouth daily.  04/13/18   Inez Catalina, MD  citalopram (CELEXA) 40 MG tablet TAKE 1 TABLET BY MOUTH ONCE DAILY Patient taking differently: Take 40 mg by mouth at bedtime.  02/14/18   Ginger Carne, MD  dextrose (RELION GLUCOSE) 40 % GEL Take 37.5 g by mouth once as needed for low blood sugar. 01/23/16   Rivet, Iris Pert, MD  EPINEPHrine 0.3 mg/0.3 mL IJ SOAJ injection Inject  0.3 mLs (0.3 mg total) into the muscle as needed (for anaphylaxis). 12/08/17   Dwana Melena, DO  gabapentin (NEURONTIN) 100 MG capsule Take 3 capsules (300 mg total) by mouth at bedtime. Patient not taking: Reported on 08/19/2018 07/14/18 10/12/18  Claudean Severance, MD  gabapentin (NEURONTIN) 300 MG capsule Take 1 capsule (300 mg total) by mouth every 8 (eight) hours. for 30 days Patient taking differently: Take 300 mg by mouth 2 (two) times daily. for 30 days 08/15/18   Luretha Murphy, MD  hydrocortisone (ANUSOL-HC) 25 MG suppository Place 1 suppository (25 mg total) rectally 2 (two) times daily. 09/23/18   Jacalyn Lefevre, MD  lactulose (CHRONULAC) 10 GM/15ML solution Take 15 mLs (10 g total) by mouth daily as needed for moderate constipation. 09/23/18   Jacalyn Lefevre, MD  lisinopril (PRINIVIL,ZESTRIL) 40 MG tablet TAKE 1 TABLET BY MOUTH ONCE DAILY Patient taking differently: Take 40 mg by mouth daily.  04/11/18   [name], MD  lovastatin (MEVACOR) 20 MG tablet Take 1 tablet (20 mg total) by mouth at bedtime. Patient not taking: Reported on 08/19/2018 01/16/18   Ginger Carne, MD  meclizine (ANTIVERT) 25 MG tablet Take 1 tablet (25 mg total) by mouth 3 (three) times daily as needed for dizziness. 08/20/18   Liberty Handy, PA-C  metoprolol tartrate (LOPRESSOR) 100 MG tablet Take 1 tablet (100 mg total) by mouth 2 (two) times daily. 01/04/18   Ginger Carne, MD  omeprazole (PRILOSEC) 40 MG capsule Take 1 capsule (40 mg total) by mouth daily. Patient not taking: Reported on 08/19/2018 01/17/18   Ginger Carne, MD  ondansetron (ZOFRAN-ODT) 4 MG disintegrating tablet Take 1 tablet (4 mg total) by mouth every 6 (six) hours as needed for nausea or vomiting. 08/15/18   Luretha Murphy, MD  oxyCODONE (ROXICODONE) 5 MG/5ML solution Take 5-10 mLs (5-10 mg total) by mouth every 4 (four) hours as needed for moderate pain or severe pain. 08/15/18   Luretha Murphy, MD  pantoprazole (PROTONIX) 40 MG tablet Take 1  tablet (40 mg total) by mouth daily. 08/15/18   Luretha Murphy, MD  sitaGLIPtin (JANUVIA) 100 MG tablet Take 100 mg by mouth at bedtime.     [provider]  VICTOZA 18 MG/3ML SOPN INJECT 0.3 ML (1.8 MG) INTO THE SKIN DAILY Patient taking differently: Inject 1.8 mg into the skin daily.  05/29/18   [name], MD    Family History Family History  Problem Relation Age of Onset  . Sarcoidosis Father   . Leukemia Father   . Hypertension Mother   . Diabetes Mother   . Heart attack Mother   . Aneurysm Mother  CNS  . Breast cancer Mother   . Hypertension Sister   . Diabetes Sister   . Sleep apnea Sister   . Asthma Other   . Cancer Other   . Heart disease Other   . Stroke Other   . Colon cancer Neg Hx   . Colon polyps Neg Hx   . Esophageal cancer Neg Hx   . Rectal cancer Neg Hx   . Stomach cancer Neg Hx     Social History Social History   Tobacco Use  . Smoking status: Never Smoker  . Smokeless tobacco: Never Used  Substance Use Topics  . Alcohol use: No    Alcohol/week: 0.0 standard drinks  . Drug use: No     Allergies   Ceftin [cefuroxime axetil]; Shrimp [shellfish allergy]; Pineapple; Adhesive [tape]; and Azithromycin   Review of Systems Review of Systems  Gastrointestinal: Positive for blood in stool and constipation.  Genitourinary: Positive for difficulty urinating.  All other systems reviewed and are negative.    Physical Exam Updated Vital Signs BP (!) 175/81 (BP Location: Right Wrist)   Pulse 81   Temp 98.8 F (37.1 C) (Oral)   Resp 18   Ht 5\' 4"  (1.626 m)   Wt 113.4 kg   SpO2 99%   BMI 42.91 kg/m   Physical Exam Vitals signs and nursing note reviewed.  Constitutional:      Appearance: She is well-developed. She is obese.  HENT:     Head: Normocephalic and atraumatic.     Mouth/Throat:     Mouth: Mucous membranes are moist.     Pharynx: Oropharynx is clear.  Eyes:     Extraocular Movements: Extraocular movements intact.       Pupils: Pupils are equal, round, and reactive to light.  Cardiovascular:     Rate and Rhythm: Normal rate and regular rhythm.  Pulmonary:     Effort: Pulmonary effort is normal.     Breath sounds: Normal breath sounds.  Abdominal:     General: Abdomen is flat. Bowel sounds are decreased.     Tenderness: There is generalized abdominal tenderness.  Genitourinary:    Rectum: Guaiac result positive. Tenderness and external hemorrhoid present.  Skin:    General: Skin is warm.     Capillary Refill: Capillary refill takes less than 2 seconds.  Neurological:     Mental Status: She is alert and oriented to person, place, and time.  Psychiatric:        Mood and Affect: Mood normal.        Behavior: Behavior normal.      ED Treatments / Results  Labs (all labs ordered are listed, but only abnormal results are displayed) Labs Reviewed  LIPASE, BLOOD - Abnormal; Notable for the following components:      Result Value   Lipase 82 (*)    All other components within normal limits  COMPREHENSIVE METABOLIC PANEL - Abnormal; Notable for the following components:   Potassium 2.9 (*)    Glucose, Bld 183 (*)    All other components within normal limits  URINALYSIS, ROUTINE W REFLEX MICROSCOPIC - Abnormal; Notable for the following components:   Color, Urine STRAW (*)    Ketones, ur 5 (*)    All other components within normal limits  POC OCCULT BLOOD, ED - Abnormal; Notable for the following components:   Fecal Occult Bld POSITIVE (*)    All other components within normal limits  CBC    EKG None  Radiology No results found.  Procedures Procedures (including critical care time)  Medications Ordered in ED Medications  potassium chloride SA (K-DUR,KLOR-CON) CR tablet 40 mEq (has no administration in time range)  sorbitol, milk of mag, mineral oil, glycerin (SMOG) enema (960 mLs Rectal Given 09/23/18 2026)  sodium chloride 0.9 % bolus 1,000 mL (0 mLs Intravenous Stopped 09/23/18  2249)  lidocaine (XYLOCAINE) 2 % jelly 1 application (1 application Other Given 09/23/18 2040)  LORazepam (ATIVAN) injection 1 mg (1 mg Intravenous Given 09/23/18 2026)  lidocaine (XYLOCAINE) 2 % jelly 1 application (1 application Topical Given 09/23/18 2250)     Initial Impression / Assessment and Plan / ED Course  I have reviewed the triage vital signs and the nursing notes.  Pertinent labs & imaging results that were available during my care of the patient were reviewed by me and considered in my medical decision making (see chart for details).    Bladder was decompressed, and pt had a SMOG enema.  She was able to have a LARGE BM and is feeling much better.  She is also able to urinate on her own.    Potassium is slightly low, so she was given potassium.  Pt will be d/c home with lactulose as [employer] no longer pays for Miralax.    She is to eat a higher fiber diet.   Return if worse.  Final Clinical Impressions(s) / ED Diagnoses   Final diagnoses:  Constipation, unspecified constipation type  Hemorrhoids, unspecified hemorrhoid type  Hypokalemia    ED Discharge Orders         Ordered    lactulose (CHRONULAC) 10 GM/15ML solution  Daily PRN     09/23/18 2338    hydrocortisone (ANUSOL-HC) 25 MG suppository  2 times daily     09/23/18 2349           Jacalyn LefevreHaviland, Lossie Kalp, MD 09/23/18 2354

## 2018-09-23 NOTE — ED Notes (Signed)
Large BM noted in bedside commode. Pt states that she feels much better,

## 2018-09-23 NOTE — Discharge Instructions (Signed)
OTC Miralax as needed for constipation.

## 2018-10-11 ENCOUNTER — Encounter: Payer: BLUE CROSS/BLUE SHIELD | Attending: Surgery | Admitting: Skilled Nursing Facility1

## 2018-10-11 ENCOUNTER — Encounter: Payer: Self-pay | Admitting: Skilled Nursing Facility1

## 2018-10-11 DIAGNOSIS — Z794 Long term (current) use of insulin: Secondary | ICD-10-CM

## 2018-10-11 DIAGNOSIS — E113599 Type 2 diabetes mellitus with proliferative diabetic retinopathy without macular edema, unspecified eye: Secondary | ICD-10-CM

## 2018-10-11 NOTE — Progress Notes (Signed)
Follow-up visit:  8 Weeks Post-Operative sleeve Surgery  Primary concerns today: Post-operative Bariatric Surgery Nutrition Management.   Pt states she went to the ED with dehydration, constipation, and protruding hemorroids stating this was her motivation to meet her fluid needs. Pt state she keeps water and Gatorade with her. Pt states she wakes in the middle of night and eats cheese. Pt states she stays thirsty all day and urinates throughout the night. Pt states she was starting to get depressed from waating a certain amount of food and not be able to eat as much as she wants. Pt states she is starting to realize she needs to work on taking care of herself and letting things go. Pt state she is no longer biting her nails and does not understand why. Pt states "How do I act." not knowing how to act now.  Pt state she really like the psychologist who evaluated her for surgery so she will make an appt.   Surgery date: 08/15/2019 Surgery type: [facility]leeve [facility] Weight today: 249  TANITA  BODY COMP RESULTS  10/11/2018   BMI (kg/m^2) 42.7   Fat Mass (lbs) 112.6   Fat Free Mass (lbs) 136.6   Total Body Water (lbs) 99.2    24-hr recall: B (AM): protein shake with cup of coffee Snk (AM):  L (PM): 2 packs of tuna or thigh with wings Snk (PM): cheese D (PM): 2 eggs or chicken or steak  Snk (PM):   Fluid intake: gatorade zero, water: 82+oz Estimated total protein intake: 60+  Medications: see list Supplementation: bariatric advantage and calcium   Using straws: no Drinking while eating: yes Having you been chewing well:no Chewing/swallowing difficulties: no Changes in vision: no Changes to mood/headaches: no Hair loss/Cahnges to skin/Changes to nails: no Any difficulty focusing or concentrating: no Sweating: no Dizziness/Lightheaded: no Palpitations: no  Carbonated beverages: no N/V/D/C/GAS: taking stool softener and miralax every morning  Abdominal Pain:  no Dumping syndrome: no  Recent physical activity:  BELT  Progress Towards Goal(s):  In progress.  Handouts given during visit include:  Non starchy veggies    Intervention:  Nutrition counseling. Pts diet was advanced to the next phase now including non starchy veggies. Due to the bodies need for essential vitamins, minerals, and fats the pt was educated on the need to consume a certain amount of calories as well as certain nutrients daily. Pt was educated on the need for daily physical activity and to reach a goal of at least 150 minutes of moderate to vigorous physical activity as directed by their physician due to such benefits as increased musculature and improved lab values.   Goals: -Continue to aim for a minimum of 64 fluid ounces 7 days a week with at least 30 ounces being plain water -Eat non-starchy vegetables 2 times a day 7 days a week -Start out with soft cooked vegetables today and tomorrow; if tolerated begin to eat raw vegetables or cooked including salads -Eat your 3 ounces of protein first then start in on your non-starchy vegetables; once you understand how much of your meal leads to satisfaction and not full while still eating 3 ounces of protein and non-starchy vegetables you can eat them in any order  -Continue to aim for 30 minutes of activity at least 5 times a week Teaching Method Utilized:  Visual Auditory Hands on  Barriers to learning/adherence to lifestyle change: non starchy   Demonstrated degree of understanding via:  Teach Back  Monitoring/Evaluation:  Dietary intake, exercise, and body weight. Follow up in 3-4 months

## 2018-10-11 NOTE — Patient Instructions (Signed)
-  Continue to aim for a minimum of 64 fluid ounces 7 days a week with at least 30 ounces being plain water  -Eat non-starchy vegetables 2 times a day 7 days a week  -Start out with soft cooked vegetables today and tomorrow; if tolerated begin to eat raw vegetables or cooked including salads  -Eat your 3 ounces of protein first then start in on your non-starchy vegetables; once you understand how much of your meal leads to satisfaction and not full while still eating 3 ounces of protein and non-starchy vegetables you can eat them in any order   -Continue to aim for 30 minutes of activity at least 5 times a week  

## 2018-10-28 ENCOUNTER — Other Ambulatory Visit: Payer: Self-pay | Admitting: Internal Medicine

## 2018-11-18 ENCOUNTER — Other Ambulatory Visit: Payer: Self-pay | Admitting: Internal Medicine

## 2018-11-24 ENCOUNTER — Other Ambulatory Visit: Payer: Self-pay | Admitting: Internal Medicine

## 2018-11-24 DIAGNOSIS — F419 Anxiety disorder, unspecified: Secondary | ICD-10-CM

## 2018-11-24 DIAGNOSIS — F329 Major depressive disorder, single episode, unspecified: Secondary | ICD-10-CM

## 2018-11-24 DIAGNOSIS — E113599 Type 2 diabetes mellitus with proliferative diabetic retinopathy without macular edema, unspecified eye: Secondary | ICD-10-CM

## 2018-11-24 DIAGNOSIS — F32A Depression, unspecified: Secondary | ICD-10-CM

## 2018-11-24 DIAGNOSIS — Z794 Long term (current) use of insulin: Principal | ICD-10-CM

## 2018-11-29 ENCOUNTER — Other Ambulatory Visit: Payer: Self-pay | Admitting: Internal Medicine

## 2018-11-29 DIAGNOSIS — Z794 Long term (current) use of insulin: Secondary | ICD-10-CM

## 2018-11-29 DIAGNOSIS — F419 Anxiety disorder, unspecified: Principal | ICD-10-CM

## 2018-11-29 DIAGNOSIS — E113599 Type 2 diabetes mellitus with proliferative diabetic retinopathy without macular edema, unspecified eye: Secondary | ICD-10-CM

## 2018-11-29 DIAGNOSIS — F329 Major depressive disorder, single episode, unspecified: Secondary | ICD-10-CM

## 2018-11-29 DIAGNOSIS — F32A Depression, unspecified: Secondary | ICD-10-CM

## 2018-11-30 NOTE — Telephone Encounter (Signed)
Januvia and citalopram refills were authorized by pcp on 11/28/18, but failed to transmit.  Both rxs phoned into pharmacy (left on recorder).Criss Alvine, Shaleka Brines Cassady3/19/202011:45 AM

## 2018-12-05 ENCOUNTER — Other Ambulatory Visit: Payer: Self-pay

## 2018-12-05 ENCOUNTER — Telehealth: Payer: Self-pay | Admitting: Internal Medicine

## 2018-12-05 ENCOUNTER — Telehealth (INDEPENDENT_AMBULATORY_CARE_PROVIDER_SITE_OTHER): Payer: BLUE CROSS/BLUE SHIELD | Admitting: Internal Medicine

## 2018-12-05 DIAGNOSIS — Z794 Long term (current) use of insulin: Secondary | ICD-10-CM

## 2018-12-05 DIAGNOSIS — I10 Essential (primary) hypertension: Secondary | ICD-10-CM | POA: Diagnosis not present

## 2018-12-05 DIAGNOSIS — E113599 Type 2 diabetes mellitus with proliferative diabetic retinopathy without macular edema, unspecified eye: Secondary | ICD-10-CM

## 2018-12-05 NOTE — Progress Notes (Signed)
This is a telephone encounter between [employer] and Sheila Ferguson on 12/05/2018 for management of her diabetes and hypertension. The visit was conducted with the patient located at home and Sheila Ferguson at [facility]. The patient's identity was confirmed using their DOB and current address. The patient has consented to being evaluated through a telephone encounter and understands the associated risks/benefits. I personally spent on medical discussion.   CC: T2DM  HPI: [name] is a 58 y.o. F w/ PMH of T2DM, HTN, Morbid obesity s/p sleeve gastrectomy having telephone encounter for management of her chronic conditions. She states significant weight loss since her bypass surgery with about 15 pound weight loss since December. She states she is currently eating beans, meat and vegetable and avoiding starchy foods and heavy carb load. She mentions she continues have nausea and vomiting when she eats or drinks too much but is otherwise feeling well. She states her sugars have been much lower than usual with blood glucometer reading around 90-100s. She denies any significant cold sweats, malaise, tremors. She also states she now has a blood pressure cuff at home and has been measuring her blood pressure at home. She was concerned that her blood pressure was running too low with systolic bp around 110s. She mentions that she is used to seeing 140s usually. She denies any light-headedness, dizziness, weakness, chest pain, dyspnea.  Past Medical History:  Diagnosis Date  . Allergy   . Anal fissure   . Anemia   . Anxiety   . Arthritis   . Bradycardia    slow heart rate per pt - unsure of rate   . Congenital stricture of esophagus   . Depression   . Diabetes mellitus type II   . DIABETES MELLITUS, TYPE II, UNCONTROLLED 09/01/2007   04/10/13 eye exam neg proliferative DM retinopathy, +non proliferative DM retinopathy OS and OD +amblyopia OD   . Dysmetabolic syndrome   . Gall bladder  disease    removed  . GERD (gastroesophageal reflux disease)   . Headache(784.0)   . Heart murmur   . Hemorrhoids   . Hypertension   . Neuromuscular disorder (HCC)    ruptured disc in back, burcitis   . Sarcoid   . Sleep apnea    no cpap   . Thyroid disease    no meds needed at this time    Review of Systems: Review of Systems  Constitutional: Positive for weight loss (intentional). Negative for chills, fever and malaise/fatigue.  Respiratory: Negative for shortness of breath.   Cardiovascular: Negative for chest pain and leg swelling.  Gastrointestinal: Positive for nausea and vomiting. Negative for constipation and diarrhea.  Neurological: Negative for dizziness, weakness and headaches.     Assessment & Plan:   Essential hypertension Presents with concerns about her blood pressure running too low with systolic bp around 110s instead of 140s which is what she is used to. Denies any light-headedness, dizziness, chest pain, dyspnea. Discussed the definition of normal range for blood pressure and importance of continuing her current regimen of amlodipine, lisinopril and metoprolol. [name] expressed understanding.  - C/w current regimen: amlodipine 10mg , lisinopril 40mg , metoprolol tartrate 100mg  BID  Diabetes mellitus type 2 with retinopathy Discussed her glucometer readings after her gastric sleeve surgery. She mentions that her blood glucose levels have significantly improved. Mentions that her glucometer readings range between 90-110s. Denies any hypoglycemic symptoms. Continuing to endorse nausea and vomiting after her gastric sleeve surgery on occasion. Currently  taking victoza and [place] without difficulty. Discussed benefit of gastric sleeve surgery in reducing a1c and spoke about lack of significant benefit when using Glp-1 agonist and DPP-4 inhibitor together with higher risk of side effects. [name] expressed understanding.  - D/c Januvia - C/w victoza 1.8mg  daily      Patient discussed with Dr. Cleda Daub   -Sheila Ferguson, PGY1

## 2018-12-05 NOTE — Assessment & Plan Note (Addendum)
Presents with concerns about her blood pressure running too low with systolic bp around 110s instead of 140s which is what she is used to. Denies any light-headedness, dizziness, chest pain, dyspnea. Discussed the definition of normal range for blood pressure and importance of continuing her current regimen of amlodipine, lisinopril and metoprolol. Sheila Ferguson expressed understanding.  - C/w current regimen: amlodipine 10mg , lisinopril 40mg , metoprolol tartrate 100mg  BID

## 2018-12-05 NOTE — Assessment & Plan Note (Signed)
Discussed her glucometer readings after her gastric sleeve surgery. She mentions that her blood glucose levels have significantly improved. Mentions that her glucometer readings range between 90-110s. Denies any hypoglycemic symptoms. Continuing to endorse nausea and vomiting after her gastric sleeve surgery on occasion. Currently taking victoza and [place] without difficulty. Discussed benefit of gastric sleeve surgery in reducing a1c and spoke about lack of significant benefit when using Glp-1 agonist and DPP-4 inhibitor together with higher risk of side effects. Sheila Ferguson expressed understanding.  - D/c Januvia - C/w victoza 1.8mg  daily

## 2018-12-05 NOTE — Telephone Encounter (Signed)
Attempted to call patient to set up telephone encounter. No answer. Voicemail full.

## 2018-12-12 ENCOUNTER — Telehealth: Payer: Self-pay | Admitting: Dietician

## 2018-12-12 NOTE — Telephone Encounter (Signed)
Sheila Ferguson is a 58 y.o. female who was contacted on behalf of [facility] Geriatrics Task Force.     I called [name] today and kept call brief as she was at work and had recently had a discussion with Dr. Nedra Hai about her diabetes and blood pressure. We mainly discussed her progress and frustrations with food after bariatric surgery. She still vomits some foods:- eggs, meat, cannot have potatoes, limas, corn, string beans(?), She is eating chili,  Lot's of vegetables, some fruit, doesn't like the taste of gluten free bread. We discussed other options and that gluten free is not necessarily healthier. She is happy with her weight loss- she is now 236#        Exercise- she was participating in the belt program but it has stopped.  P: She has follow up schedule with the bariatric dietitian in August. Encouraged easy to digest sources of protein. Encourage exercise in the home and walking when pollen decreases. Follow up for geriatric task force in 3-6 months.  Norm Parcel, RD 12/12/2018 10:32 AM.

## 2018-12-29 NOTE — Progress Notes (Signed)
Internal Medicine Clinic Attending ° °Case discussed with Dr. Lee at the time of the visit.  We reviewed the resident’s history and exam and pertinent patient test results.  I agree with the assessment, diagnosis, and plan of care documented in the resident’s note.  °

## 2019-01-17 ENCOUNTER — Other Ambulatory Visit: Payer: Self-pay | Admitting: Internal Medicine

## 2019-01-17 DIAGNOSIS — G629 Polyneuropathy, unspecified: Secondary | ICD-10-CM

## 2019-01-23 ENCOUNTER — Other Ambulatory Visit: Payer: Self-pay | Admitting: Internal Medicine

## 2019-01-23 ENCOUNTER — Ambulatory Visit: Payer: Self-pay

## 2019-01-23 MED ORDER — GABAPENTIN 100 MG PO CAPS: 300.0000 mg | ORAL_CAPSULE | Freq: Every day | ORAL | 2 refills | Status: AC

## 2019-01-30 ENCOUNTER — Telehealth: Payer: Self-pay

## 2019-02-10 ENCOUNTER — Other Ambulatory Visit: Payer: Self-pay | Admitting: Internal Medicine

## 2019-02-10 DIAGNOSIS — I10 Essential (primary) hypertension: Secondary | ICD-10-CM

## 2019-04-12 IMAGING — MG DIGITAL DIAGNOSTIC UNILATERAL RIGHT MAMMOGRAM WITH TOMO AND CAD
8 series · 8 of 24 positions shown · non-contrast
Comparison: 08/29/2018 and earlier

CLINICAL DATA: Patient returns after screening study for evaluation
of a possible RIGHT breast mass. Family history of breast cancer
diagnosed in her mother in her late 30s or 40s and in 2 paternal
aunts.

EXAM:
DIGITAL DIAGNOSTIC RIGHT MAMMOGRAM WITH CAD AND TOMO
ULTRASOUND RIGHT BREAST

[R CC synth-2D (1 of 2)]
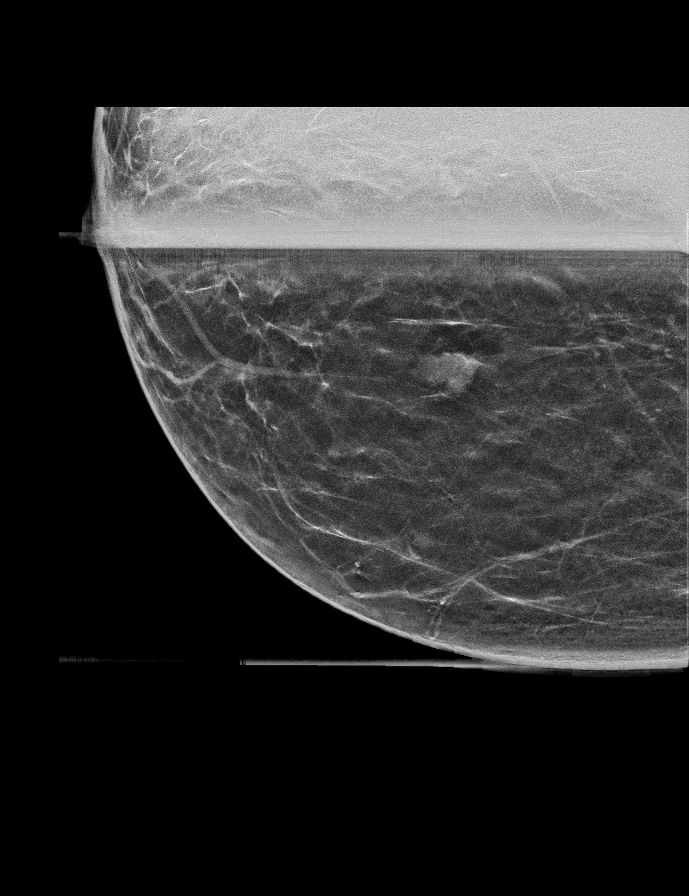

[R MLO synth-2D (1 of 2)]
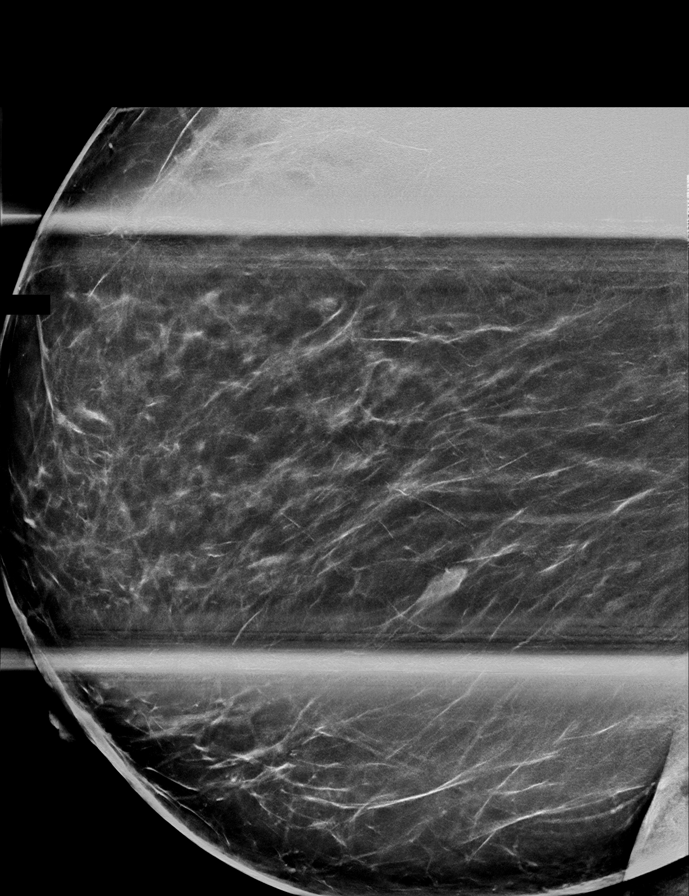

[R MLO synth-2D (2 of 2)]
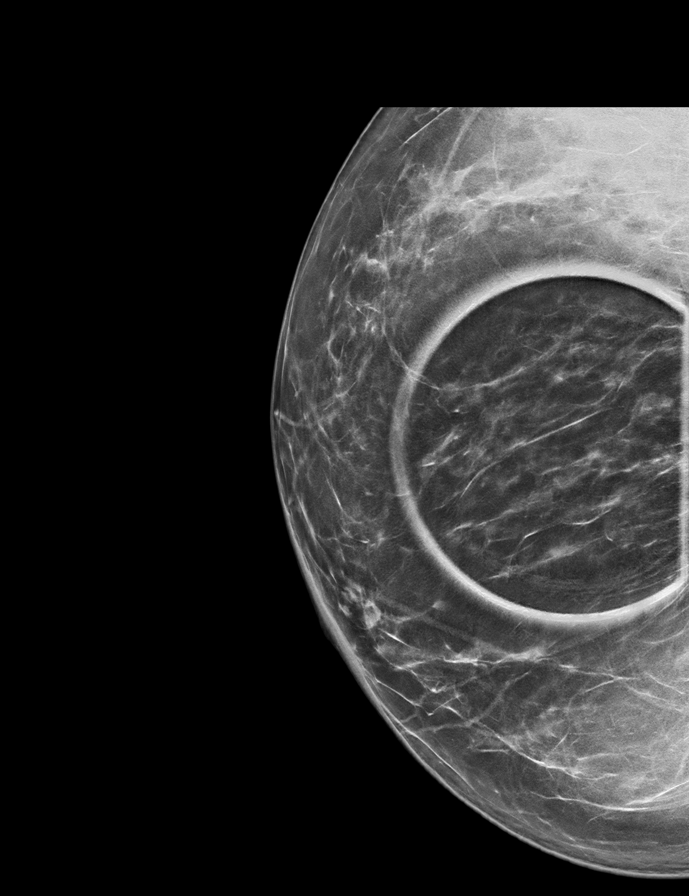

[R CC synth-2D (2 of 2)]
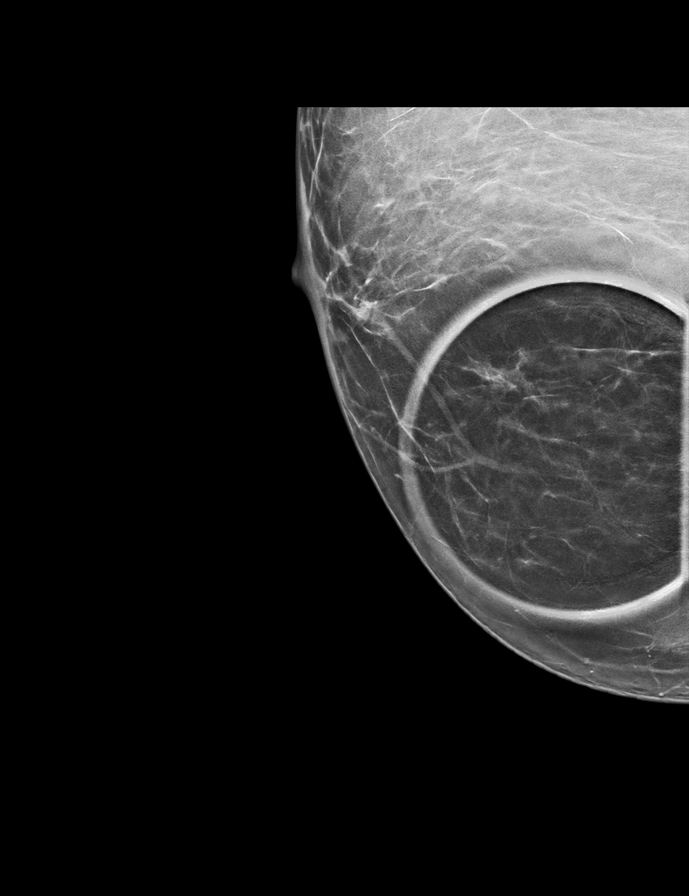

[R MLO tomo (1 of 2) · tomo slice 40/79.0]
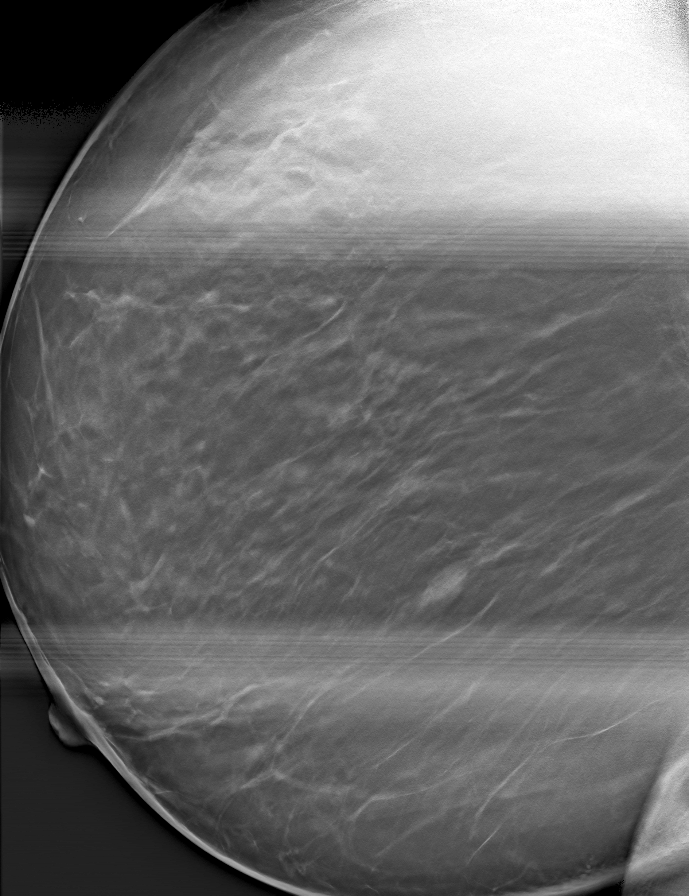

[R CC tomo (1 of 2) · tomo slice 27/54.0]
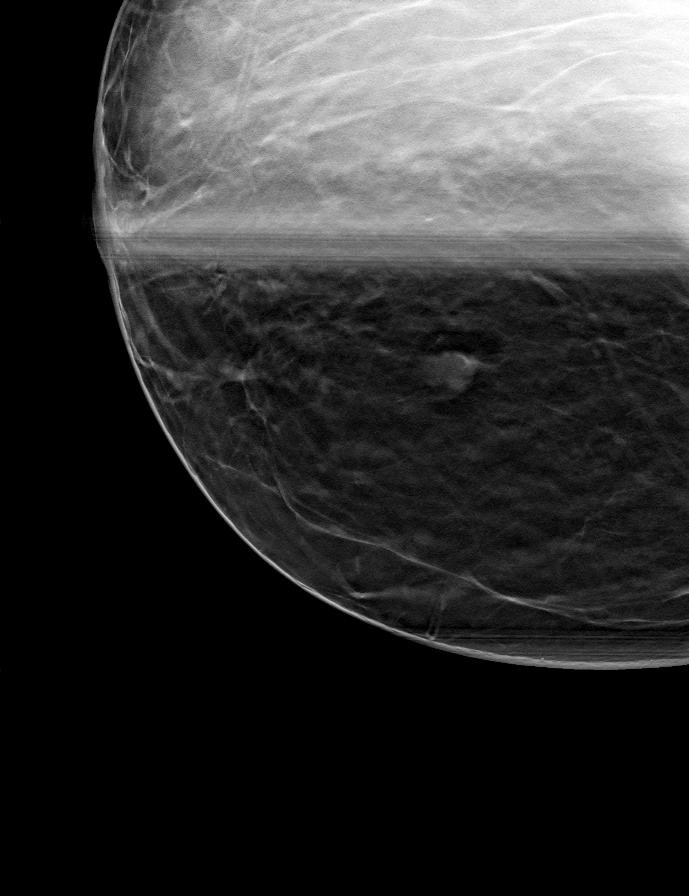

[R MLO tomo (2 of 2) · tomo slice 37/72.0]
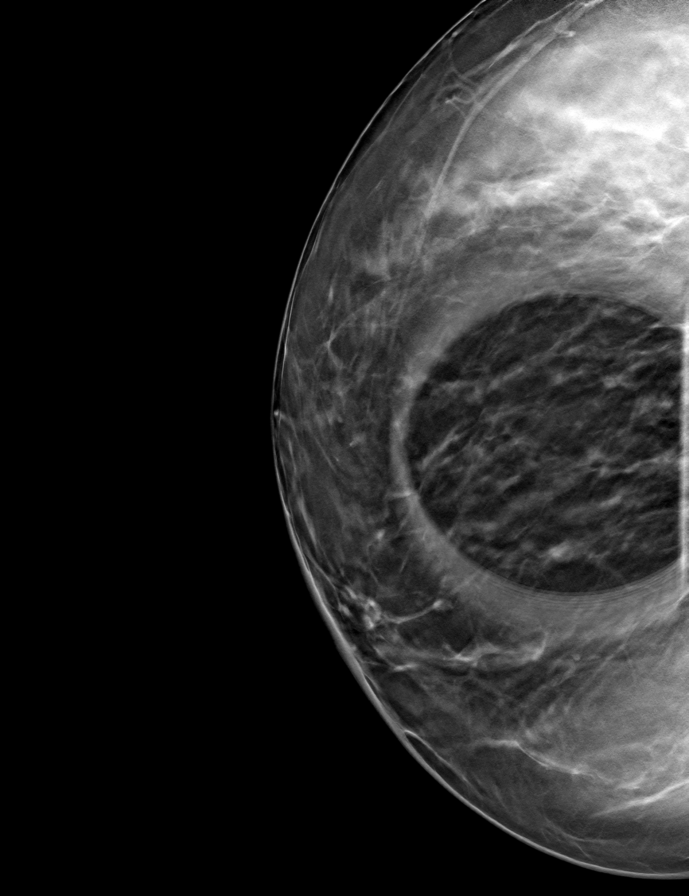

[R CC tomo (2 of 2) · tomo slice 28/55.0]
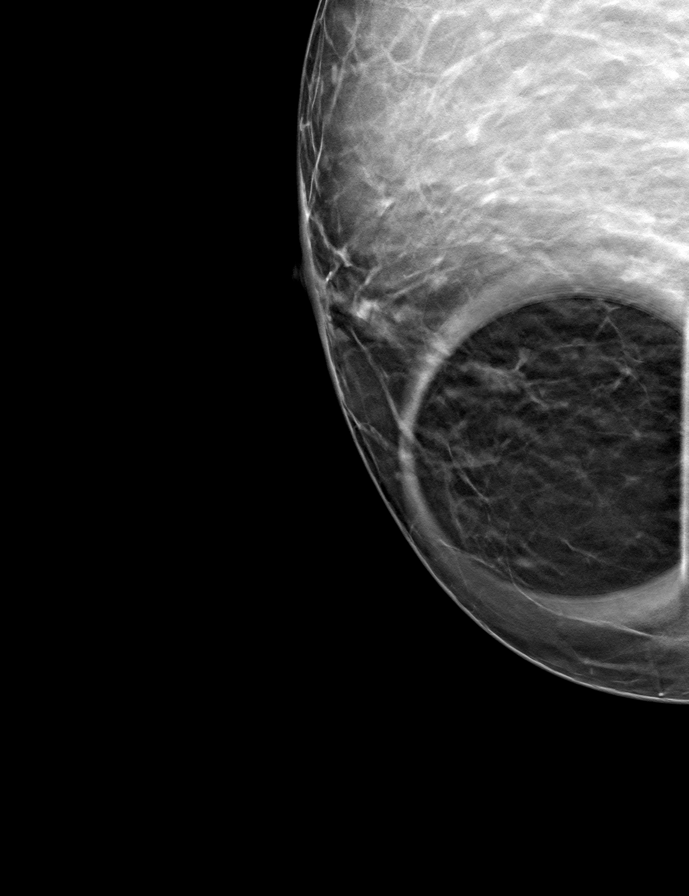

[8 of 24 positions shown; findings below may reference images not displayed]

ACR Breast Density Category b: There are scattered areas of
fibroglandular density. I will
FINDINGS: Additional 2-D and 3-D images are performed. These views confirm
presence of an oval circumscribed mass in the MEDIAL aspect of the
RIGHT breast and further evaluated sonographically.

Mammographic images were processed with CAD.

On physical exam, I palpate no abnormality in the MEDIAL aspect of
the RIGHT breast.

Targeted ultrasound is performed, showing a group of microcysts in
the 2 o'clock location of the RIGHT breast 10 centimeters from the
nipple which measures 0.7 x 0.8 x 0.7 centimeters. There is no
associated internal blood flow or acoustic shadowing.
IMPRESSION: Area of probable (metaplasia/fibrocystic changes in the MEDIAL
aspect of the RIGHT breast accounting for the mammographic
abnormality. We discussed management options including excision,
biopsy, and close follow-up. Per discussion with the patient and her
sister, Ms. Graniit, in addition to the history of breast cancer,
there are other family members with history of other types of
cancer. Patient elects biopsy for definitive diagnosis

RECOMMENDATION:
Ultrasound-guided core biopsy of RIGHT breast mass.

I have discussed the findings and recommendations with the patient.
Results were also provided in writing at the conclusion of the
visit. If applicable, a reminder letter will be sent to the patient
regarding the next appointment.

BI-RADS CATEGORY  3: Probably benign.

## 2019-04-18 ENCOUNTER — Encounter: Payer: BLUE CROSS/BLUE SHIELD | Attending: Surgery | Admitting: Skilled Nursing Facility1

## 2019-04-18 ENCOUNTER — Other Ambulatory Visit: Payer: Self-pay

## 2019-04-18 DIAGNOSIS — E1122 Type 2 diabetes mellitus with diabetic chronic kidney disease: Secondary | ICD-10-CM | POA: Insufficient documentation

## 2019-04-18 DIAGNOSIS — N182 Chronic kidney disease, stage 2 (mild): Secondary | ICD-10-CM | POA: Insufficient documentation

## 2019-04-18 NOTE — Progress Notes (Signed)
Follow-up visit: Post-Operative sleeve Surgery  Primary concerns today: Post-operative Bariatric Surgery Nutrition Management.   Pt states her sister was diagnosed with dementia and taking car eo fher. Pt state s last week she started meal prep after realizing she has been emotionally eating and going back to old habits. Pt state when she could go to the gym she felt accomplished but now that she cannot go is not doing right. Pt sates her work has been encouraging them to do desk exercises. Pt state sshe wants to buy a smart watch to remind her to move. Pt state she likes her size and does not want to lose any more weight but wants to maintain. Pt states due to emotional eating she has been eating until emesis. Pt state she was using frozen broccoli tots as her vegetable: dietitian educated the pt on this being inappropriate. Pt states she has acid reflux daily. Pt states she has diarrhea every day (2-3 times).   Will discuss emotional eating next visit; still working on the foundations of how to eat a balanced healthy diet.   Surgery date: 08/15/2019 [facility] type[facility]ve [facility] Weight today: 242.1  Body Composition Scale 04/18/2019  Total Body Fat % 45.9  Visceral Fat 18  Fat-Free Mass % 54   Total Body Water % 41.5   Muscle-Mass lbs 30.1  Body Fat Displacement          Torso  lbs 68.8         Left Leg  lbs 13.7         Right Leg  lbs 13.7         Left Arm  lbs 6.8         Right Arm   lbs 6.8    24-hr recall: eating cashews throughout the  B (AM): protein shake with cup of coffee and yogurt  Snk (AM): chips and dip L (PM): 2 packs of tuna or thigh with wings or salad Snk (PM): cheese D (PM): 2 eggs or chicken or steak or baked chicken and green beans or 6 in sub of tuna Snk (PM): nuts  Fluid intake: coffee, gatorade zero, water: 40+oz Estimated total protein intake: 60+  Medications: only taking [place] Supplementation: bariatric advantage and calcium    Using straws: no Drinking while eating: yes Having you been chewing well:no Chewing/swallowing difficulties: no Changes in vision: no Changes to mood/headaches: no Hair loss/Cahnges to skin/Changes to nails: no Any difficulty focusing or concentrating: no Sweating: no Dizziness/Lightheaded: no Palpitations: no  Carbonated beverages: no N/V/D/C/GAS: taking stool softener and miralax every morning  Abdominal Pain: no Dumping syndrome: no  Recent physical activity:  ADL's  Progress Towards Goal(s):  In progress.  Handouts given during visit include:  Non starchy veggies    Intervention:  Nutrition counseling. Pts diet was advanced to the next phase now including non starchy veggies. Due to the bodies need for essential vitamins, minerals, and fats the pt was educated on the need to consume a certain amount of calories as well as certain nutrients daily. Pt was educated on the need for daily physical activity and to reach a goal of at least 150 minutes of moderate to vigorous physical activity as directed by their physician due to such benefits as increased musculature and improved lab values.   Goals: -Do some kind of stretch or exercise at your desk every 30 minutes: Buy a watch -Measure your nuts (1/4 cup) and food in general -Eat  a balanced meal using the meal ideas sheet -CHEW UNTIL APPLESAUCE CONSISTENCY: Sheila Ferguson really soft and take at least 30 minutes to eat -Stop drinking the protein shakes for 3 days  Teaching Method Utilized:  Visual Auditory Hands on  Barriers to learning/adherence to lifestyle change: emotional eating  Demonstrated degree of understanding via:  Teach Back   Monitoring/Evaluation:  Dietary intake, exercise, and body weight. Follow up in 1 month

## 2019-04-18 NOTE — Patient Instructions (Addendum)
-  Do some kind of stretch or exercise at your desk every 30 minutes: Buy a watch  -Measure your nuts (1/4 cup) and food in general  -Eat a balanced meal using the meal ideas sheet  -CHEW UNTIL APPLESAUCE CONSISTENCY: [name] really soft and take at least 30 minutes to eat  -Stop drinking the protein shakes for 3 days

## 2019-05-26 ENCOUNTER — Other Ambulatory Visit: Payer: Self-pay | Admitting: Internal Medicine

## 2019-05-26 DIAGNOSIS — I10 Essential (primary) hypertension: Secondary | ICD-10-CM

## 2019-05-30 ENCOUNTER — Ambulatory Visit: Payer: BLUE CROSS/BLUE SHIELD | Admitting: Skilled Nursing Facility1

## 2019-06-17 ENCOUNTER — Other Ambulatory Visit: Payer: Self-pay | Admitting: Internal Medicine

## 2019-06-17 DIAGNOSIS — I10 Essential (primary) hypertension: Secondary | ICD-10-CM

## 2019-07-20 ENCOUNTER — Other Ambulatory Visit: Payer: Self-pay | Admitting: Internal Medicine

## 2019-07-20 DIAGNOSIS — I10 Essential (primary) hypertension: Secondary | ICD-10-CM

## 2019-09-27 ENCOUNTER — Other Ambulatory Visit: Payer: Self-pay | Admitting: Internal Medicine

## 2019-09-27 DIAGNOSIS — E113599 Type 2 diabetes mellitus with proliferative diabetic retinopathy without macular edema, unspecified eye: Secondary | ICD-10-CM

## 2019-09-28 ENCOUNTER — Other Ambulatory Visit: Payer: Self-pay | Admitting: Internal Medicine

## 2019-09-28 DIAGNOSIS — E113599 Type 2 diabetes mellitus with proliferative diabetic retinopathy without macular edema, unspecified eye: Secondary | ICD-10-CM

## 2019-09-28 DIAGNOSIS — Z794 Long term (current) use of insulin: Secondary | ICD-10-CM

## 2020-03-11 ENCOUNTER — Encounter (HOSPITAL_COMMUNITY): Payer: Self-pay

## 2020-08-11 ENCOUNTER — Other Ambulatory Visit: Payer: Self-pay | Admitting: Family Medicine

## 2020-08-11 ENCOUNTER — Other Ambulatory Visit: Payer: Self-pay | Admitting: Physical Medicine and Rehabilitation

## 2020-08-11 DIAGNOSIS — M545 Low back pain, unspecified: Secondary | ICD-10-CM

## 2020-08-11 DIAGNOSIS — M7918 Myalgia, other site: Secondary | ICD-10-CM

## 2020-08-27 ENCOUNTER — Other Ambulatory Visit: Payer: Self-pay

## 2020-08-27 ENCOUNTER — Ambulatory Visit
Admission: RE | Admit: 2020-08-27 | Discharge: 2020-08-27 | Disposition: A | Discharge status: Home / Self Care | Payer: 59 | Source: Ambulatory Visit | Attending: Physical Medicine and Rehabilitation | Admitting: Physical Medicine and Rehabilitation

## 2020-08-27 DIAGNOSIS — M545 Low back pain, unspecified: Secondary | ICD-10-CM

## 2020-08-27 DIAGNOSIS — M7918 Myalgia, other site: Secondary | ICD-10-CM

## 2020-10-01 ENCOUNTER — Other Ambulatory Visit: Payer: Self-pay | Admitting: Internal Medicine

## 2020-10-01 DIAGNOSIS — G629 Polyneuropathy, unspecified: Secondary | ICD-10-CM

## 2020-10-02 ENCOUNTER — Ambulatory Visit: Payer: Self-pay

## 2020-10-02 ENCOUNTER — Other Ambulatory Visit: Payer: Self-pay

## 2020-10-02 ENCOUNTER — Encounter: Payer: Self-pay | Admitting: Specialist

## 2020-10-02 ENCOUNTER — Ambulatory Visit: Payer: 59 | Admitting: Specialist

## 2020-10-02 VITALS — BP 125/80 | HR 88 | Ht 64.0 in | Wt 275.2 lb

## 2020-10-02 DIAGNOSIS — M5416 Radiculopathy, lumbar region: Secondary | ICD-10-CM | POA: Diagnosis not present

## 2020-10-02 DIAGNOSIS — M48062 Spinal stenosis, lumbar region with neurogenic claudication: Secondary | ICD-10-CM

## 2020-10-02 DIAGNOSIS — M1711 Unilateral primary osteoarthritis, right knee: Secondary | ICD-10-CM | POA: Diagnosis not present

## 2020-10-02 DIAGNOSIS — M5136 Other intervertebral disc degeneration, lumbar region: Secondary | ICD-10-CM

## 2020-10-02 DIAGNOSIS — M1712 Unilateral primary osteoarthritis, left knee: Secondary | ICD-10-CM | POA: Diagnosis not present

## 2020-10-02 DIAGNOSIS — M4316 Spondylolisthesis, lumbar region: Secondary | ICD-10-CM
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
## 2020-10-02 NOTE — Patient Instructions (Addendum)
Avoid bending, stooping and avoid lifting weights greater than 10 lbs. Avoid prolong standing and walking. Avoid frequent bending and stooping  No lifting greater than 10 lbs. May use ice or moist heat for pain. Weight loss is of benefit. Handicap license is approved. Dr. [place] Blas secretary/Assistant will call to arrange for epidural steroid injection  Knee is suffering from osteoarthritis, only real proven treatments are Weight loss, do not take NSIADs like diclofenac motrin or naprosyn and exercise. Well padded shoes help. Ice the knee that is suffering from osteoarthritis, only real proven treatments are . Ice the knee 2-3 times a day 15-20 mins at a time.-3 times a day 15-20 mins at a time. Hot showers in the AM.  Injection with steroid may be of benefit. Hemp CBD capsules, amazon.com 5,000-7,000 mg per bottle, 60 capsules per bottle, take one capsule twice a day. Cane in the left hand to use with left leg weight bearing. Follow-Up Instructions: No follow-ups on file.

## 2020-10-20 ENCOUNTER — Telehealth: Payer: Self-pay | Admitting: Physical Medicine and Rehabilitation

## 2020-10-20 NOTE — Telephone Encounter (Signed)
Called pt and sch 2/21

## 2020-10-20 NOTE — Telephone Encounter (Signed)
Patient called. She missed a call to schedule with Dr. Alvester Morin. Would like a call back. [phone]

## 2020-11-03 ENCOUNTER — Other Ambulatory Visit: Payer: Self-pay

## 2020-11-03 ENCOUNTER — Ambulatory Visit: Payer: Self-pay

## 2020-11-03 ENCOUNTER — Ambulatory Visit: Payer: 59 | Admitting: Physical Medicine and Rehabilitation

## 2020-11-03 ENCOUNTER — Telehealth: Payer: Self-pay | Admitting: Specialist

## 2020-11-03 ENCOUNTER — Encounter: Payer: Self-pay | Admitting: Physical Medicine and Rehabilitation

## 2020-11-03 VITALS — BP 154/85 | HR 81

## 2020-11-03 DIAGNOSIS — M5416 Radiculopathy, lumbar region: Secondary | ICD-10-CM | POA: Diagnosis not present

## 2020-11-03 MED ORDER — BETAMETHASONE SOD PHOS & ACET 6 (3-3) MG/ML IJ SUSP
12.0000 mg | Freq: Once | INTRAMUSCULAR | Status: AC
  Administered 2020-11-03: 12 mg

## 2020-11-03 NOTE — Progress Notes (Signed)
Sheila Ferguson - 60 y.o. female MRN [id number]  Date of birth: [birth year]  Office Visit Note: Visit Date: 11/03/2020 PCP: Gaspar Garbe, MD Referred by: Gaspar Garbe, MD  Subjective: Chief Complaint  Patient presents with  . Lower Back - Pain   HPI:  Sheila Ferguson is a 60 y.o. female who comes in today at the request of Dr. Vira Browns for planned Right L4-L5 Lumbar epidural steroid injection with fluoroscopic guidance.  The patient has failed conservative care including home exercise, medications, time and activity modification.  This injection will be diagnostic and hopefully therapeutic.  Please see requesting physician notes for further details and justification.  MRI reviewed with images and spine model.  MRI reviewed in the note below.  Prior epidural injection once every two years by Dr. Romero Belling at [facility].  I will try to get his notes.  ROS Otherwise per HPI.  Assessment & Plan: Visit Diagnoses:    ICD-10-CM   1. Lumbar radiculopathy  M54.16 XR C-ARM NO REPORT    Epidural Steroid injection    betamethasone acetate-betamethasone sodium phosphate (CELESTONE) injection 12 mg    Plan: No additional findings.   Meds & Orders:  Meds ordered this encounter  Medications  . betamethasone acetate-betamethasone sodium phosphate (CELESTONE) injection 12 mg    Orders Placed This Encounter  Procedures  . XR C-ARM NO REPORT  . Epidural Steroid injection    Follow-up: Return for visit to requesting physician as needed.   Procedures: No procedures performed  Lumbar Epidural Steroid Injection - Interlaminar Approach with Fluoroscopic Guidance  Patient: Sheila Ferguson      Date of Birth: 02/27/1961 MRN: [id number] PCP: Gaspar Garbe, MD      Visit Date: 11/03/2020   Universal Protocol:     Consent Given By: the patient  Position: PRONE  Additional Comments: Vital signs were monitored before and after the procedure. Patient  was prepped and draped in the usual sterile fashion. The correct patient, procedure, and site was verified.   Injection Procedure Details:   Procedure diagnoses: Lumbar radiculopathy [M54.16]   Meds Administered:  Meds ordered this encounter  Medications  . betamethasone acetate-betamethasone sodium phosphate (CELESTONE) injection 12 mg     Laterality: Right  Location/Site:  L4-L5  Needle: 4.5 in., 20 ga. Tuohy  Needle Placement: Paramedian epidural  Findings:   -Comments: Excellent flow of contrast into the epidural space.  Procedure Details: Using a paramedian approach from the side mentioned above, the region overlying the inferior lamina was localized under fluoroscopic visualization and the soft tissues overlying this structure were infiltrated with 4 ml. of 1% Lidocaine without Epinephrine. The Tuohy needle was inserted into the epidural space using a paramedian approach.   The epidural space was localized using loss of resistance along with counter oblique bi-planar fluoroscopic views.  After negative aspirate for air, blood, and CSF, a 2 ml. volume of Isovue-250 was injected into the epidural space and the flow of contrast was observed. Radiographs were obtained for documentation purposes.    The injectate was administered into the level noted above.   Additional Comments:  The patient tolerated the procedure well Dressing: 2 x 2 sterile gauze and Band-Aid    Post-procedure details: Patient was observed during the procedure. Post-procedure instructions were reviewed.  Patient left the clinic in stable condition.     Clinical History: MRI LUMBAR SPINE WITHOUT CONTRAST  TECHNIQUE: Multiplanar, multisequence MR imaging of the lumbar spine was  performed. No intravenous contrast was administered.  COMPARISON:  05/21/2016 and prior.  FINDINGS: Segmentation:  Standard.  Alignment: Straightening of lordosis. Grade 1 L4-5 anterolisthesis. Mild dorsal  epidural lipomatosis  Vertebrae: Vertebral body heights are preserved. Scattered T1/T2 hyperintense foci reflect hemangiomata versus focal fat. Mild periarticular inflammation involving the bilateral facet joints at the L4-5 level.  Conus medullaris and cauda equina: Conus extends to the L2 level. Conus and cauda equina appear normal.  Disc levels: Multilevel desiccation most prominent at the L4-S1 levels.  L1-2: Shallow left foraminal protrusion. Facet degenerative spurring. Patent spinal canal and neural foramen.  L2-3: Minimal disc bulge with shallow right foraminal protrusion. Bilateral facet hypertrophy. Patent spinal canal and left neural foramen. Minimal to mild right neural foraminal narrowing.  L3-4: Minimal disc bulge with shallow right foraminal protrusion. Prominent ligamentum flavum and bilateral facet hypertrophy. Patent spinal canal. Mild bilateral neural foraminal narrowing.  L4-5: Grade 1 anterolisthesis with uncovered bulge grazing the exiting L4 nerve roots. Ligamentum flavum thickening and bilateral facet hypertrophy. Mild spinal canal and mild bilateral neural foraminal narrowing.  L5-S1: Disc bulge grazing the exiting left greater than right L5 nerve root. Bilateral facet hypertrophy. Mild spinal canal and left neural foraminal narrowing. Patent right neural foramen.  Paraspinal and other soft tissues: Incidental retroperitoneal lipoma at the L3 level, unchanged.  IMPRESSION: Multilevel spondylosis and epidural lipomatosis.  Mild spinal canal narrowing at the L4-5 and L5-S1 levels.  Mild right L2-3, bilateral L3-4, L4-5 and left L5-S1 neural foraminal narrowing.  Grazing of the exiting L4 and exiting L5 nerve roots.   Electronically Signed   By: Stana Bunting M.D.   On: 08/27/2020 09:22     Objective:  VS:  HT:    WT:   BMI:     BP:(!) 154/85  HR:81bpm  TEMP: ( )  RESP:  Physical Exam Vitals and nursing note  reviewed.  Constitutional:      General: She is not in acute distress.    Appearance: Normal appearance. She is obese. She is not ill-appearing.  HENT:     Head: Normocephalic and atraumatic.     Right Ear: External ear normal.     Left Ear: External ear normal.  Eyes:     Extraocular Movements: Extraocular movements intact.  Cardiovascular:     Rate and Rhythm: Normal rate.     Pulses: Normal pulses.  Pulmonary:     Effort: Pulmonary effort is normal. No respiratory distress.  Abdominal:     General: There is no distension.     Palpations: Abdomen is soft.  Musculoskeletal:        General: Tenderness present.     Cervical back: Neck supple.     Right lower leg: No edema.     Left lower leg: No edema.     Comments: Patient has good distal strength with no pain over the greater trochanters.  No clonus or focal weakness.  Skin:    Findings: No erythema, lesion or rash.  Neurological:     General: No focal deficit present.     Mental Status: She is alert and oriented to person, place, and time.     Sensory: No sensory deficit.     Motor: No weakness or abnormal muscle tone.     Coordination: Coordination normal.  Psychiatric:        Mood and Affect: Mood normal.        Behavior: Behavior normal.      Imaging: XR C-ARM NO  REPORT  Result Date: 11/03/2020 Please see Notes tab for imaging impression.

## 2020-11-03 NOTE — Telephone Encounter (Signed)
Auth/request for records from [facility] ortho re-faxed [phone]

## 2020-11-03 NOTE — Progress Notes (Signed)
Pt state lower back pain. Pt state walking for a long time makes the pain worse. Pt state sitting and use heating pads help ease the pain. Pt state she has to sit down while cooking.  Numeric Pain Rating Scale and Functional Assessment Average Pain 8   In the last MONTH (on 0-10 scale) has pain interfered with the following?  1. General activity like being  able to carry out your everyday physical activities such as walking, climbing stairs, carrying groceries, or moving a chair?  Rating(10)   +Driver, -BT, -Dye Allergies.

## 2020-11-03 NOTE — Procedures (Signed)
Lumbar Epidural Steroid Injection - Interlaminar Approach with Fluoroscopic Guidance  Patient: Sheila Ferguson      Date of Birth: 02/04/1961 MRN: [id number] PCP: Gaspar Garbe, MD      Visit Date: 11/03/2020   Universal Protocol:     Consent Given By: the patient  Position: PRONE  Additional Comments: Vital signs were monitored before and after the procedure. Patient was prepped and draped in the usual sterile fashion. The correct patient, procedure, and site was verified.   Injection Procedure Details:   Procedure diagnoses: Lumbar radiculopathy [M54.16]   Meds Administered:  Meds ordered this encounter  Medications  . betamethasone acetate-betamethasone sodium phosphate (CELESTONE) injection 12 mg     Laterality: Right  Location/Site:  L4-L5  Needle: 4.5 in., 20 ga. Tuohy  Needle Placement: Paramedian epidural  Findings:   -Comments: Excellent flow of contrast into the epidural space.  Procedure Details: Using a paramedian approach from the side mentioned above, the region overlying the inferior lamina was localized under fluoroscopic visualization and the soft tissues overlying this structure were infiltrated with 4 ml. of 1% Lidocaine without Epinephrine. The Tuohy needle was inserted into the epidural space using a paramedian approach.   The epidural space was localized using loss of resistance along with counter oblique bi-planar fluoroscopic views.  After negative aspirate for air, blood, and CSF, a 2 ml. volume of Isovue-250 was injected into the epidural space and the flow of contrast was observed. Radiographs were obtained for documentation purposes.    The injectate was administered into the level noted above.   Additional Comments:  The patient tolerated the procedure well Dressing: 2 x 2 sterile gauze and Band-Aid    Post-procedure details: Patient was observed during the procedure. Post-procedure instructions were reviewed.  Patient  left the clinic in stable condition.

## 2020-11-03 NOTE — Patient Instructions (Signed)

## 2021-01-01 ENCOUNTER — Ambulatory Visit: Payer: 59 | Admitting: Specialist

## 2021-03-04 ENCOUNTER — Encounter (HOSPITAL_COMMUNITY): Payer: Self-pay | Admitting: *Deleted

## 2022-01-04 NOTE — Progress Notes (Addendum)
? ? ?01/06/2022 ?Shoshana N Haigler ?[id number] ?1961/07/24 ? ?Referring provider: Tisovec, Fransico Him, MD ?Primary GI doctor: Dr. Fuller Plan ? ?ASSESSMENT AND PLAN:  ? ?Rectal bleeding ?08/07/2018 colonoscopy Showed diverticula, internal hemorrhoids small grade 1 recall 10 years ?Likely internal hemorrhoids, worse after exercises, specifically squats, unlikley ischemic with normal AB exam and how infrequent.  ?-Sitz baths, increase fiber, increase water, need to fix toilet habits.  ?-Hydrocortisone supp give and external cream sent in.  ?- also patient is on ozempic x 3-4 month, may have some constipation though patient denies, add on fiber.  ?-We discussed hemorrhoid banding here in the office, will schedule with Dr. Fuller Plan 2-3 months, if fails conservative management can do banding.  ?- if worsening AB pain, proceed with CT AB ?-     hydrocortisone (ANUSOL-HC) 2.5 % rectal cream; Place 1 application. rectally 2 (two) times daily..  ? ?Diverticulosis of colon without hemorrhage ?Will call if any symptoms. ?Add on fiber supplement, avoid NSAIDS, information given ? ? ?History of Present Illness:  ?61 y.o. female  with a past medical history of hypertension, diabetes, GERD, anal fissures, internal hemorrhoid and others listed below, returns to clinic today for evaluation of rectal bleeding. ?Last seen by Tye Savoy, NP 01/2015 for abdominal pain, nausea vomiting internal hemorrhoids. ?10/2006 colonoscopy for hematochezia findings included anal fissure. ?08/07/2018 colonoscopy Showed diverticula, internal hemorrhoids small grade 1 recall 10 years. ? ?She states 1 month ago, started to have rectal bleeding.  ?Had positive FOBT with PCP.  ?Would have 3 episodes always after lifting weights at gym with personal trainer, would go to the bathroom and have rectal bleeding. Small volume- moderate volume with stool.  ?She is on ozempic for 4-5 months.  ?  ?She denies constipation, straining. Did increase weights recently.   ? ?Current Medications:  ? ? ? ?Current Outpatient Medications (Cardiovascular):  ?  amLODipine (NORVASC) 10 MG tablet, Take 1 tablet (10 mg total) by mouth daily. (Patient taking differently: Take 5 mg by mouth daily.) ?  EPINEPHrine 0.3 mg/0.3 mL IJ SOAJ injection, Inject 0.3 mLs (0.3 mg total) into the muscle as needed (for anaphylaxis). ?  lisinopril (PRINIVIL,ZESTRIL) 40 MG tablet, TAKE 1 TABLET BY MOUTH ONCE DAILY (Patient taking differently: Take 40 mg by mouth daily.) ?  lovastatin (MEVACOR) 20 MG tablet, Take 1 tablet (20 mg total) by mouth at bedtime. ?  metoprolol tartrate (LOPRESSOR) 100 MG tablet, Take 1 tablet by mouth twice daily ? ? ? ? ?Current Outpatient Medications (Analgesics):  ?  acetaminophen (TYLENOL) 500 MG tablet, Take 2 tablets (1,000 mg total) by mouth 3 (three) times daily. (Patient taking differently: Take 1,000 mg by mouth every 8 (eight) hours as needed for mild pain or headache.) ? ? ? ? ?Current Outpatient Medications (Other):  ?  citalopram (CELEXA) 40 MG tablet, TAKE 1 TABLET BY MOUTH ONCE DAILY ?  gabapentin (NEURONTIN) 100 MG capsule, Take 3 capsules (300 mg total) by mouth at bedtime. ?  hydrocortisone (ANUSOL-HC) 2.5 % rectal cream, Place 1 application. rectally 2 (two) times daily. ?  tiZANidine (ZANAFLEX) 4 MG tablet, Take 4 mg by mouth 3 (three) times daily. ? ? ?Facility-Administered Medications Ordered in Other Visits (Other):  ?  lactated ringers bolus 1,000 mL **IN FOLLOWED-BY LINKED GROUP WITH** lactated ringers infusion ?No current facility-administered medications for this visit. ? ?Surgical History:  ?She  has a past surgical history that includes Oophorectomy; Cholecystectomy; Esophageal dilation; Abdominal hysterectomy; Upper gastrointestinal endoscopy; Colonoscopy (2008); and Laparoscopic gastric  sleeve resection (N/A, 08/14/2018). ?Family History:  ?Her family history includes Aneurysm in her mother; Asthma in an other family member; Breast cancer in her mother;  Cancer in an other family member; Diabetes in her mother and sister; Heart attack in her mother; Heart disease in an other family member; Hypertension in her mother and sister; Leukemia in her father; Sarcoidosis in her father; Sleep apnea in her sister; Stroke in an other family member. ?Social History:  ? reports that she has never smoked. She has never used smokeless tobacco. She reports that she does not drink alcohol and does not use drugs. ? ?Current Medications, Allergies, Past Medical History, Past Surgical History, Family History and Social History were reviewed in [employer] record. ? ?Physical Exam: ?BP 140/82   Pulse 88   Ht 5\' 4"  (1.626 m)   Wt 260 lb (117.9 kg)   SpO2 98%   BMI 44.63 kg/m?  ?General:   Pleasant, well developed female in no acute distress ?Heart : Regular rate and rhythm; no murmurs ?Pulm: Clear anteriorly; no wheezing ?Abdomen:  Soft, Obese AB, Active bowel sounds. No tenderness . , No organomegaly appreciated. ?Rectal: Normal external rectal exam, normal rectal tone, appreciated medium sized internal hemorrhoids, tender, no masses, , brown stool, hemoccult Negative ?Extremities:  without  edema. ?Neurologic:  Alert and  oriented x4;  No focal deficits.  ?Psych:  Cooperative. Normal mood and affect. ? ? ?Vladimir Crofts, PA-C ?01/06/22 ?

## 2022-01-06 ENCOUNTER — Ambulatory Visit: Payer: 59 | Admitting: Physician Assistant

## 2022-01-06 ENCOUNTER — Encounter: Payer: Self-pay | Admitting: Physician Assistant

## 2022-01-06 VITALS — BP 140/82 | HR 88 | Ht 64.0 in | Wt 260.0 lb

## 2022-01-06 DIAGNOSIS — K5730 Diverticulosis of large intestine without perforation or abscess without bleeding: Secondary | ICD-10-CM

## 2022-01-06 DIAGNOSIS — K625 Hemorrhage of anus and rectum: Secondary | ICD-10-CM | POA: Diagnosis not present

## 2022-01-06 MED ORDER — HYDROCORTISONE (PERIANAL) 2.5 % EX CREA: 1.0000 "application " | TOPICAL_CREAM | Freq: Two times a day (BID) | CUTANEOUS | 2 refills | Status: AC

## 2022-01-06 NOTE — Patient Instructions (Addendum)
We have sent the following medications to your pharmacy for you to pick up at your convenience: ?Anusol Cream  ? ?Please do sitz baths, increase fiber or add benefiber, increase water and increase acitivity.  ? ?If the hemorrhoid suppository sent in is too expensive you can do this over the counter trick.  ?Apply a pea size amount of over the counter Anusol HC cream to the tip of an over the counter PrepH suppository and insert rectally once every night for at least 7 nights.  ? ?Talk with your personal trainer about AB strength exercises or modifying exercise.  ? ?About Hemorrhoids ? ?Hemorrhoids are swollen veins in the lower rectum and anus.  Also called piles, hemorrhoids are a common problem.  Hemorrhoids may be internal (inside the rectum) or external (around the anus). ? ?Internal Hemorrhoids ? ?Internal hemorrhoids are often painless, but they rarely cause bleeding.  The internal veins may stretch and fall down (prolapse) through the anus to the outside of the body.  The veins may then become irritated and painful. ? ?External Hemorrhoids ? ?External hemorrhoids can be easily seen or felt around the anal opening.  They are under the skin around the anus.  When the swollen veins are scratched or broken by straining, rubbing or wiping they sometimes bleed. ? ?How Hemorrhoids Occur ? ?Veins in the rectum and around the anus tend to swell under pressure.  Hemorrhoids can result from increased pressure in the veins of your anus or rectum.  Some sources of pressure are: ? ?Straining to have a bowel movement because of constipation ?Waiting too long to have a bowel movement ?Coughing and sneezing often ?Sitting for extended periods of time, including on the toilet ?Diarrhea ?Obesity ?Trauma or injury to the anus ?Some liver diseases ?Stress ?Family history of hemorrhoids ?Pregnancy ? Pregnant women should try to avoid becoming constipated, because they are more likely to have hemorrhoids during pregnancy.  In the  last trimester of pregnancy, the enlarged uterus may press on blood vessels and causes hemorrhoids.  In addition, the strain of childbirth sometimes causes hemorrhoids after the birth. ? ?Symptoms of Hemorrhoids ? ?Some symptoms of hemorrhoids include: ?Swelling and/or a tender lump around the anus ?Itching, mild burning and bleeding around the anus ?Painful bowel movements with or without constipation ?Bright red blood covering the stool, on toilet paper or in the toilet bowel.  ? ?Symptoms usually go away within a few days.  Always talk to your doctor about any bleeding to make sure it is not from some other causes. ? ?Diagnosing and Treating Hemorrhoids ? ?Diagnosis is made by an examination by your healthcare provider.  Special test can be performed by your doctor.   ? ?Most cases of hemorrhoids can be treated with: ?High-fiber diet: Eat more high-fiber foods, which help prevent constipation.  Ask for more detailed fiber information on types and sources of fiber from your healthcare provider. ?Fluids: Drink plenty of water.  This helps soften bowel movements so they are easier to pass. ?Sitz baths and cold packs: Sitting in lukewarm water two or three times a day for 15 minutes cleases the anal area and may relieve discomfort.  If the water is too hot, swelling around the anus will get worse.  Placing a cloth-covered ice pack on the anus for ten minutes four times a day can also help reduce selling.  Gently pushing a prolapsed hemorrhoid back inside after the bath or ice pack can be helpful. ?Medications: For mild discomfort,  your healthcare provider may suggest over-the-counter pain medication or prescribe a cream or ointment for topical use.  The cream may contain witch hazel, zinc oxide or petroleum jelly.  Medicated suppositories are also a treatment option.  Always consult your doctor before applying medications or creams. ?Procedures and surgeries: There are also a number of procedures and surgeries to  shrink or remove hemorrhoids in more serious cases.  Talk to your physician about these options. ? ?You can often prevent hemorrhoids or keep them from becoming worse by maintaining a healthy lifestyle.  Eat a fiber-rich diet of fruits, vegetables and whole grains.  Also, drink plenty of water and exercise regularly. ? ?? 2007, [employer] Doc.30 ? ? ?Diverticulosis ?Diverticulosis is a condition that develops when small pouches (diverticula) form in the wall of the large intestine (colon). The colon is where water is absorbed and stool (feces) is formed. The pouches form when the inside layer of the colon pushes through weak spots in the outer layers of the colon. You may have a few pouches or many of them. ?The pouches usually do not cause problems unless they become inflamed or infected. When this happens, the condition is called diverticulitis- this is left lower quadrant pain, diarrhea, fever, chills, nausea or vomiting.  If this occurs please call the office or go to the hospital. ?Sometimes these patches without inflammation can also have painless bleeding associated with them, if this happens please call the office or go to the hospital. ?Preventing constipation and increasing fiber can help reduce diverticula and prevent complications. ?Even if you feel you have a high-fiber diet, suggest getting on Benefiber 2-3 times daily. ?What increases the risk? ?The following factors may make you more likely to develop this condition: ?Being older than age 61. Your risk for this condition increases with age. Diverticulosis is rare among people younger than age 61. By age 61, many people have it. ?Eating a low-fiber diet. ?Having frequent constipation. ?Being overweight. ?Not getting enough exercise. ?Smoking. ?Taking over-the-counter pain medicines, like aspirin and ibuprofen.- can increase a flare.  ?Having a family history of diverticulosis. ?How is this diagnosed? ?Because diverticulosis usually has  no symptoms, it is most often diagnosed during an exam for other colon problems. The condition may be diagnosed by: ?Using a flexible scope to examine the colon (colonoscopy). ?Taking an X-ray of the colon after dye has been put into the colon (barium enema). ?Having a CT scan. ?How is this treated? ?You may not need treatment for this condition. Your health care provider may recommend treatment to prevent problems. You may need treatment if you have symptoms or if you previously had diverticulitis. Treatment may include: ?Eating a high-fiber diet. ?Taking a fiber supplement. ?Taking a live bacteria supplement (probiotic). ?Taking medicine to relax your colon. ?Contact a health care provider if you: ?Have pain in your abdomen. ?Have bloating. ?Have cramps. ?Have not had a bowel movement in 3 days. ?Get help right away if: ?Your pain gets worse. ?Your bloating becomes very bad. ?You have a fever or chills, and your symptoms suddenly get worse. ?You vomit. ?You have bowel movements that are bloody or black. ?You have bleeding from your rectum. ?Summary ?Diverticulosis is a condition that develops when small pouches (diverticula) form in the wall of the large intestine (colon). ?You may have a few pouches or many of them. ?This condition is most often diagnosed during an exam for other colon problems. ?Treatment may include increasing the fiber in your diet, taking  supplements, or taking medicines. ?This information is not intended to replace advice given to you by your health care provider. Make sure you discuss any questions you have with your health care provider. ?Document Revised: 03/29/2019 Document Reviewed: 03/29/2019 ?Elsevier Patient Education ? 2022 Elsevier Inc. ? ?

## 2022-01-17 ENCOUNTER — Other Ambulatory Visit: Payer: Self-pay

## 2022-01-17 ENCOUNTER — Encounter (HOSPITAL_COMMUNITY): Payer: Self-pay

## 2022-01-17 ENCOUNTER — Emergency Department (HOSPITAL_COMMUNITY): Payer: 59

## 2022-01-17 ENCOUNTER — Emergency Department (HOSPITAL_COMMUNITY)
Admission: EM | Admit: 2022-01-17 | Discharge: 2022-01-17 | Disposition: A | Discharge status: Home / Self Care | Payer: 59 | Attending: Emergency Medicine | Admitting: Emergency Medicine

## 2022-01-17 DIAGNOSIS — Z23 Encounter for immunization: Secondary | ICD-10-CM | POA: Diagnosis not present

## 2022-01-17 DIAGNOSIS — S6991XA Unspecified injury of right wrist, hand and finger(s), initial encounter: Secondary | ICD-10-CM | POA: Diagnosis present

## 2022-01-17 DIAGNOSIS — S61011A Laceration without foreign body of right thumb without damage to nail, initial encounter: Secondary | ICD-10-CM

## 2022-01-17 DIAGNOSIS — W268XXA Contact with other sharp object(s), not elsewhere classified, initial encounter: Secondary | ICD-10-CM | POA: Diagnosis not present

## 2022-01-17 DIAGNOSIS — Y92009 Unspecified place in unspecified non-institutional (private) residence as the place of occurrence of the external cause: Secondary | ICD-10-CM | POA: Diagnosis not present

## 2022-01-17 DIAGNOSIS — Z79899 Other long term (current) drug therapy: Secondary | ICD-10-CM | POA: Insufficient documentation

## 2022-01-17 MED ORDER — TETANUS-DIPHTH-ACELL PERTUSSIS 5-2.5-18.5 LF-MCG/0.5 IM SUSY
0.5000 mL | PREFILLED_SYRINGE | Freq: Once | INTRAMUSCULAR | Status: AC
  Administered 2022-01-17: 0.5 mL via INTRAMUSCULAR
  Filled 2022-01-17: qty 0.5

## 2022-01-17 MED ORDER — LIDOCAINE HCL (PF) 1 % IJ SOLN
5.0000 mL | Freq: Once | INTRAMUSCULAR | Status: AC
  Administered 2022-01-17: 5 mL
  Filled 2022-01-17: qty 30

## 2022-01-17 MED ORDER — ACETAMINOPHEN 500 MG PO TABS
1000.0000 mg | ORAL_TABLET | Freq: Four times a day (QID) | ORAL | Status: DC | PRN
  Administered 2022-01-17: 1000 mg via ORAL
  Filled 2022-01-17: qty 2

## 2022-01-17 NOTE — ED Triage Notes (Signed)
Patient was opening up a can and cut the inside of her right hand in between her pointer finger and thumb along with a laceration on the middle of her thumb.  ?

## 2022-01-17 NOTE — ED Notes (Signed)
I provided reinforced discharge education based off of discharge instructions. Pt acknowledged and understood my education. Pt had no further questions/concerns for provider/myself.  °

## 2022-01-17 NOTE — Discharge Instructions (Signed)
Please be on the look out for any signs of infection.  These include redness, increased swelling, pus draining from the site ?Please read the attached informational guide concerning laceration care ?Please keep the bandaging on for 24 hours.  Please use the supplies provided to you to redress it after 24 hours ?Please return to urgent care, ED or PCP for suture removal in 7 to 10 days ?

## 2022-01-17 NOTE — ED Provider Notes (Signed)
? [facility]Provider Note ? ? ?CSN: [id number] ?Arrival date & time: 01/17/22  1934 ? ?  ? ?History ? ?Chief Complaint  ?Patient presents with  ? Laceration  ? ? ?Sheila Ferguson is a 61 y.o. female who presents ED for evaluation of thumb laceration.  Patient states that she was at home using a can opener when the can opener slipped and she sliced her right thumb.  Patient unsure of last tetanus shot.  Patient denies excessive blood loss. ? ? ?Laceration ? ?  ? ?Home Medications ?Prior to Admission medications   ?Medication Sig Start Date End Date Taking? Authorizing Provider  ?acetaminophen (TYLENOL) 500 MG tablet Take 2 tablets (1,000 mg total) by mouth 3 (three) times daily. ?Patient taking differently: Take 1,000 mg by mouth every 8 (eight) hours as needed for mild pain or headache. 01/13/16   Rivet, Iris Pert, MD  ?amLODipine (NORVASC) 10 MG tablet Take 1 tablet (10 mg total) by mouth daily. ?Patient taking differently: Take 5 mg by mouth daily. 04/13/18   Inez Catalina, MD  ?citalopram (CELEXA) 40 MG tablet TAKE 1 TABLET BY MOUTH ONCE DAILY 11/28/18   [name], MD  ?EPINEPHrine 0.3 mg/0.3 mL IJ SOAJ injection Inject 0.3 mLs (0.3 mg total) into the muscle as needed (for anaphylaxis). 12/08/17   Dwana Melena, DO  ?gabapentin (NEURONTIN) 100 MG capsule Take 3 capsules (300 mg total) by mouth at bedtime. 01/23/19 01/06/22  Inez Catalina, MD  ?hydrocortisone (ANUSOL-HC) 2.5 % rectal cream Place 1 application. rectally 2 (two) times daily. 01/06/22   Doree Albee, PA-C  ?lisinopril (PRINIVIL,ZESTRIL) 40 MG tablet TAKE 1 TABLET BY MOUTH ONCE DAILY ?Patient taking differently: Take 40 mg by mouth daily. 04/11/18   [name], MD  ?lovastatin (MEVACOR) 20 MG tablet Take 1 tablet (20 mg total) by mouth at bedtime. 01/16/18   Ginger Carne, MD  ?metoprolol tartrate (LOPRESSOR) 100 MG tablet Take 1 tablet by mouth twice daily 02/12/19   [name], MD  ?tiZANidine (ZANAFLEX) 4 MG  tablet Take 4 mg by mouth 3 (three) times daily. 06/03/20   [provider]  ?   ? ?Allergies    ?Ceftin [cefuroxime axetil], Shrimp [shellfish allergy], Pineapple, Adhesive [tape], and Azithromycin   ? ?Review of Systems   ?Review of Systems  ?Skin:  Positive for wound.  ?All other systems reviewed and are negative. ? ?Physical Exam ?Updated Vital Signs ?BP (!) 144/103 (BP Location: Right Arm)   Pulse 98   Temp 98.6 ?F (37 ?C) (Oral)   Resp 18   Ht 5\' 4"  (1.626 m)   Wt 54.4 kg   SpO2 98%   BMI 20.60 kg/m?  ?Physical Exam ?Vitals and nursing note reviewed.  ?Constitutional:   ?   General: She is not in acute distress. ?   Appearance: She is not ill-appearing, toxic-appearing or diaphoretic.  ?HENT:  ?   Head: Normocephalic and atraumatic.  ?   Nose: Nose normal. No congestion.  ?   Mouth/Throat:  ?   Mouth: Mucous membranes are moist.  ?   Pharynx: Oropharynx is clear.  ?Eyes:  ?   Extraocular Movements: Extraocular movements intact.  ?   Conjunctiva/sclera: Conjunctivae normal.  ?   Pupils: Pupils are equal, round, and reactive to light.  ?Cardiovascular:  ?   Rate and Rhythm: Normal rate and regular rhythm.  ?Pulmonary:  ?   Effort: Pulmonary effort is normal.  ?  Breath sounds: Normal breath sounds. No wheezing.  ?Abdominal:  ?   General: Abdomen is flat.  ?   Palpations: Abdomen is soft.  ?   Tenderness: There is no abdominal tenderness.  ?Skin: ?   General: Skin is warm and dry.  ?   Capillary Refill: Capillary refill takes less than 2 seconds.  ?   Findings: Laceration present.  ?Neurological:  ?   Mental Status: She is alert and oriented to person, place, and time.  ? ? ?ED Results / Procedures / Treatments   ?Labs ?(all labs ordered are listed, but only abnormal results are displayed) ?Labs Reviewed - No data to display ? ?EKG ?None ? ?Radiology ?DG Finger Thumb Right ? ?Result Date: 01/17/2022 ?CLINICAL DATA:  Laceration, rule out foreign body. EXAM: RIGHT THUMB 2+V COMPARISON:  None  Available. FINDINGS: No acute bony abnormality. Specifically, no fracture, subluxation, or dislocation. No radiopaque foreign body. Degenerative changes at the 1st carpometacarpal joint. IMPRESSION: No fracture or foreign body. Electronically Signed   By: Charlett Nose M.D.   On: 01/17/2022 20:25   ? ?Procedures ?Marland Kitchen.Laceration Repair ? ?Date/Time: 01/17/2022 9:42 PM ?Performed by: Al Decant, PA-C ?Authorized by: Al Decant, PA-C  ? ?Consent:  ?  Consent obtained:  Verbal ?  Consent given by:  Patient ?  Risks, benefits, and alternatives were discussed: yes   ?  Risks discussed:  Infection, need for additional repair, poor cosmetic result, pain and retained foreign body ?  Alternatives discussed:  No treatment ?Universal protocol:  ?  Patient identity confirmed:  Arm band and verbally with patient ?Anesthesia:  ?  Anesthesia method:  Local infiltration ?  Local anesthetic:  Lidocaine 1% w/o epi ?Laceration details:  ?  Location:  Finger ?  Finger location:  R thumb ?  Length (cm):  4 ?Pre-procedure details:  ?  Preparation:  Patient was prepped and draped in usual sterile fashion ?Exploration:  ?  Limited defect created (wound extended): no   ?  Hemostasis achieved with:  Tourniquet and direct pressure ?  Imaging obtained: x-ray   ?  Imaging outcome: foreign body not noted   ?  Contaminated: no   ?Treatment:  ?  Area cleansed with:  Saline ?  Amount of cleaning:  Extensive ?  Irrigation solution:  Sterile saline ?  Irrigation method:  Pressure wash and syringe ?  Visualized foreign bodies/material removed: no   ?  Undermining:  None ?  Scar revision: no   ?Skin repair:  ?  Repair method:  Sutures ?  Suture size:  4-0 ?  Suture material:  Prolene ?  Suture technique:  Simple interrupted ?  Number of sutures:  4 ?Approximation:  ?  Approximation:  Close ?Repair type:  ?  Repair type:  Simple ?Post-procedure details:  ?  Dressing:  Non-adherent dressing and bulky dressing ?  Procedure completion:   Tolerated well, no immediate complications  ? ? ?Medications Ordered in ED ?Medications  ?acetaminophen (TYLENOL) tablet 1,000 mg (has no administration in time range)  ?Tdap (BOOSTRIX) injection 0.5 mL (0.5 mLs Intramuscular Given 01/17/22 2029)  ?lidocaine (PF) (XYLOCAINE) 1 % injection 5 mL (5 mLs Infiltration Given by Other 01/17/22 2137)  ? ? ?ED Course/ Medical Decision Making/ A&P ?  ?                        ?Medical Decision Making ?Amount and/or Complexity of Data Reviewed ?Radiology: ordered. ? ?  Risk ?OTC drugs. ?Prescription drug management. ? ? ?61 year old female presents ED for evaluation of thumb laceration.  Please see HPI for further details. ? ?Thumb laceration repaired per procedure note ? ?Patient tolerated procedure well.  Patient tetanus vaccination updated.  Patient provided with instructions on how to care for wound at home.  Patient given supplies to care for wound at home.  Patient advised to return to ED, urgent care or PCP in 7 to 10 days for suture removal. ? ?Patient given return precautions and she voiced understanding of these.  Patient had all of her questions answered to her satisfaction.  The patient is stable for discharge home ? ? ?Final Clinical Impression(s) / ED Diagnoses ?Final diagnoses:  ?Laceration of right thumb without foreign body without damage to nail, initial encounter  ? ? ?Rx / DC Orders ?ED Discharge Orders   ? ? None  ? ?  ? ? ?  ?Al DecantGroce, Glorene Leitzke F, PA-C ?01/17/22 2144 ? ?  ?Rolan BuccoBelfi, Melanie, MD ?01/17/22 2214 ? ?

## 2022-03-05 ENCOUNTER — Ambulatory Visit: Payer: 59 | Admitting: Gastroenterology

## 2022-03-12 ENCOUNTER — Encounter (HOSPITAL_COMMUNITY): Payer: Self-pay | Admitting: *Deleted

## 2022-07-26 ENCOUNTER — Other Ambulatory Visit (HOSPITAL_COMMUNITY): Payer: Self-pay

## 2022-07-27 ENCOUNTER — Other Ambulatory Visit (HOSPITAL_COMMUNITY): Payer: Self-pay

## 2022-07-27 MED ORDER — OZEMPIC (2 MG/DOSE) 8 MG/3ML ~~LOC~~ SOPN
2.0000 mg | PEN_INJECTOR | SUBCUTANEOUS | 11 refills | Status: AC
  Filled 2022-07-27 – 2022-07-29 (×2): qty 3, 28d supply, fill #0

## 2022-07-29 ENCOUNTER — Other Ambulatory Visit (HOSPITAL_COMMUNITY): Payer: Self-pay

## 2022-12-21 ENCOUNTER — Other Ambulatory Visit: Payer: Self-pay | Admitting: Obstetrics and Gynecology

## 2022-12-21 DIAGNOSIS — R928 Other abnormal and inconclusive findings on diagnostic imaging of breast: Secondary | ICD-10-CM

## 2023-02-10 ENCOUNTER — Other Ambulatory Visit: Payer: Self-pay | Admitting: Obstetrics and Gynecology

## 2023-02-10 ENCOUNTER — Ambulatory Visit
Admission: RE | Admit: 2023-02-10 | Discharge: 2023-02-10 | Disposition: A | Discharge status: Home / Self Care | Payer: 59 | Source: Ambulatory Visit | Attending: Obstetrics and Gynecology | Admitting: Obstetrics and Gynecology

## 2023-02-10 DIAGNOSIS — R928 Other abnormal and inconclusive findings on diagnostic imaging of breast: Secondary | ICD-10-CM

## 2023-08-15 ENCOUNTER — Other Ambulatory Visit: Payer: Self-pay | Admitting: Obstetrics and Gynecology

## 2023-08-15 ENCOUNTER — Ambulatory Visit
Admission: RE | Admit: 2023-08-15 | Discharge: 2023-08-15 | Disposition: A | Discharge status: Home / Self Care | Payer: Self-pay | Source: Ambulatory Visit | Attending: Obstetrics and Gynecology | Admitting: Obstetrics and Gynecology

## 2023-08-15 DIAGNOSIS — R928 Other abnormal and inconclusive findings on diagnostic imaging of breast: Secondary | ICD-10-CM

## 2023-08-15 DIAGNOSIS — R921 Mammographic calcification found on diagnostic imaging of breast: Secondary | ICD-10-CM

## 2024-02-04 ENCOUNTER — Emergency Department (HOSPITAL_COMMUNITY): Payer: Self-pay

## 2024-02-04 ENCOUNTER — Emergency Department (HOSPITAL_COMMUNITY)
Admission: EM | Admit: 2024-02-04 | Discharge: 2024-02-04 | Disposition: A | Discharge status: Home / Self Care | Payer: Self-pay | Attending: Emergency Medicine | Admitting: Emergency Medicine

## 2024-02-04 ENCOUNTER — Encounter (HOSPITAL_COMMUNITY): Payer: Self-pay | Admitting: Emergency Medicine

## 2024-02-04 ENCOUNTER — Other Ambulatory Visit: Payer: Self-pay

## 2024-02-04 ENCOUNTER — Emergency Department (EMERGENCY_DEPARTMENT_HOSPITAL): Payer: Self-pay

## 2024-02-04 DIAGNOSIS — M545 Low back pain, unspecified: Secondary | ICD-10-CM | POA: Diagnosis present

## 2024-02-04 DIAGNOSIS — M79661 Pain in right lower leg: Secondary | ICD-10-CM

## 2024-02-04 DIAGNOSIS — M5416 Radiculopathy, lumbar region: Secondary | ICD-10-CM

## 2024-02-04 DIAGNOSIS — M4696 Unspecified inflammatory spondylopathy, lumbar region: Secondary | ICD-10-CM | POA: Insufficient documentation

## 2024-02-04 DIAGNOSIS — M5441 Lumbago with sciatica, right side: Secondary | ICD-10-CM | POA: Diagnosis not present

## 2024-02-04 DIAGNOSIS — M47816 Spondylosis without myelopathy or radiculopathy, lumbar region: Secondary | ICD-10-CM

## 2024-02-04 DIAGNOSIS — Z79899 Other long term (current) drug therapy: Secondary | ICD-10-CM | POA: Diagnosis not present

## 2024-02-04 DIAGNOSIS — M79604 Pain in right leg: Secondary | ICD-10-CM | POA: Diagnosis not present

## 2024-02-04 DIAGNOSIS — I10 Essential (primary) hypertension: Secondary | ICD-10-CM | POA: Diagnosis not present

## 2024-02-04 DIAGNOSIS — E119 Type 2 diabetes mellitus without complications: Secondary | ICD-10-CM | POA: Insufficient documentation

## 2024-02-04 LAB — CBC WITH DIFFERENTIAL/PLATELET
Abs Immature Granulocytes: 0.01 10*3/uL (ref 0.00–0.07)
Basophils Absolute: 0 10*3/uL (ref 0.0–0.1)
Basophils Relative: 0 %
Eosinophils Absolute: 0.2 10*3/uL (ref 0.0–0.5)
Eosinophils Relative: 2 %
HCT: 34.3 % — ABNORMAL LOW (ref 36.0–46.0)
Hemoglobin: 12.1 g/dL (ref 12.0–15.0)
Immature Granulocytes: 0 %
Lymphocytes Relative: 45 %
Lymphs Abs: 3.2 10*3/uL (ref 0.7–4.0)
MCH: 28.9 pg (ref 26.0–34.0)
MCHC: 35.3 g/dL (ref 30.0–36.0)
MCV: 81.9 fL (ref 80.0–100.0)
Monocytes Absolute: 0.4 10*3/uL (ref 0.1–1.0)
Monocytes Relative: 6 %
Neutro Abs: 3.4 10*3/uL (ref 1.7–7.7)
Neutrophils Relative %: 47 %
Platelets: 291 10*3/uL (ref 150–400)
RBC: 4.19 MIL/uL (ref 3.87–5.11)
RDW: 13.3 % (ref 11.5–15.5)
WBC: 7.2 10*3/uL (ref 4.0–10.5)
nRBC: 0 % (ref 0.0–0.2)

## 2024-02-04 LAB — COMPREHENSIVE METABOLIC PANEL WITH GFR
ALT: 14 U/L (ref 0–44)
AST: 17 U/L (ref 15–41)
Albumin: 3.5 g/dL (ref 3.5–5.0)
Alkaline Phosphatase: 100 U/L (ref 38–126)
Anion gap: 7 (ref 5–15)
BUN: 13 mg/dL (ref 8–23)
CO2: 24 mmol/L (ref 22–32)
Calcium: 8.7 mg/dL — ABNORMAL LOW (ref 8.9–10.3)
Chloride: 107 mmol/L (ref 98–111)
Creatinine, Ser: 0.91 mg/dL (ref 0.44–1.00)
GFR, Estimated: >60 mL/min (ref >60)
Glucose, Bld: 122 mg/dL — ABNORMAL HIGH (ref 70–99)
Potassium: 3.9 mmol/L (ref 3.5–5.1)
Sodium: 138 mmol/L (ref 135–145)
Total Bilirubin: 0.6 mg/dL (ref 0.0–1.2)
Total Protein: 7.1 g/dL (ref 6.5–8.1)

## 2024-02-04 LAB — URINALYSIS, ROUTINE W REFLEX MICROSCOPIC
Bilirubin Urine: NEGATIVE
Glucose, UA: NEGATIVE mg/dL
Hgb urine dipstick: NEGATIVE
Ketones, ur: NEGATIVE mg/dL
Nitrite: NEGATIVE
Protein, ur: NEGATIVE mg/dL
Specific Gravity, Urine: 1.015 (ref 1.005–1.030)
pH: 6 (ref 5.0–8.0)

## 2024-02-04 LAB — MAGNESIUM: Magnesium: 2.2 mg/dL (ref 1.7–2.4)

## 2024-02-04 LAB — CK: Total CK: 94 U/L (ref 38–234)

## 2024-02-04 MED ORDER — OXYCODONE-ACETAMINOPHEN 5-325 MG PO TABS
1.0000 | ORAL_TABLET | Freq: Once | ORAL | Status: AC
  Administered 2024-02-04: 1 via ORAL
  Filled 2024-02-04: qty 1

## 2024-02-04 MED ORDER — ONDANSETRON HCL 4 MG/2ML IJ SOLN
INTRAMUSCULAR | Status: AC
Start: 2024-02-04 — End: 2024-02-04
  Administered 2024-02-04: 4 mg
  Filled 2024-02-04: qty 2

## 2024-02-04 MED ORDER — ONDANSETRON 4 MG PO TBDP
ORAL_TABLET | ORAL | Status: AC
  Filled 2024-02-04: qty 1

## 2024-02-04 MED ORDER — OXYCODONE-ACETAMINOPHEN 5-325 MG PO TABS: 1.0000 | ORAL_TABLET | Freq: Four times a day (QID) | ORAL | 0 refills | Status: AC | PRN

## 2024-02-04 MED ORDER — LIDOCAINE 5 % EX PTCH
1.0000 | MEDICATED_PATCH | CUTANEOUS | Status: DC
  Administered 2024-02-04: 1 via TRANSDERMAL
  Filled 2024-02-04: qty 1

## 2024-02-04 MED ORDER — GADOBUTROL 1 MMOL/ML IV SOLN
10.0000 mL | Freq: Once | INTRAVENOUS | Status: AC | PRN
  Administered 2024-02-04: 10 mL via INTRAVENOUS

## 2024-02-04 NOTE — ED Notes (Signed)
 This nurse inquired about vascular study. Secretary has paged twice; no response. Secretary paged again at [phone].

## 2024-02-04 NOTE — ED Notes (Signed)
 PT in MRI.

## 2024-02-04 NOTE — ED Notes (Signed)
 PT vomiting at this time. Pt denies any pain or discomfort. Eusebio High PA aware and pt medicated per provider orders

## 2024-02-04 NOTE — ED Provider Notes (Signed)
 [facility] AT [facility] Provider Note   CSN: [id number] Arrival date & time: 02/04/24  4098     History Chief Complaint  Patient presents with   Back Pain    Pt reports ongoing worsening back pain on R side down back to feet, hx bursitis and L4-L5 ruptured discs. Pt is aware she needs back surgery, pt reports paresthesias down right side, now some on left side     ZALIA HAUTALA is a 63 y.o. female with h/o HTN, DM, chronic back pain, presents to the ER today for evaluation of right/lower back pain and radiating down right leg worsening over the past 2 months. The patient reports that she had had chronic back pain for years and used to get steroid injections with great relief. Her last one was with Gilberto Labella around 3 years ago. She reports that she has recently retired and she is waiting for her insurance to change before going back to see them. She denies any fecal or urinary incontinence, recent back injections, fever, saddle anesthesia.  She denies any dysuria or hematuria.  Denies abdominal pain, nausea, vomiting, diarrhea, constipation.  She denies any color, temperature, or size changes to her leg.  She denies any recent falls or trauma.  She has tried Tylenol  for pain without much relief.   Back Pain Associated symptoms: no chest pain, no dysuria, no fever, no numbness and no weakness        Home Medications Prior to Admission medications   Medication Sig Start Date End Date Taking? Authorizing Provider  acetaminophen  (TYLENOL ) 500 MG tablet Take 2 tablets (1,000 mg total) by mouth 3 (three) times daily. Patient taking differently: Take 1,000 mg by mouth every 8 (eight) hours as needed for mild pain or headache. 01/13/16   Rivet, Renae Carney, MD  amLODipine  (NORVASC ) 10 MG tablet Take 1 tablet (10 mg total) by mouth daily. Patient taking differently: Take 5 mg by mouth daily. 04/13/18   Rosezetta Contras, MD  citalopram  (CELEXA ) 40 MG tablet TAKE 1  TABLET BY MOUTH ONCE DAILY 11/28/18   Lee, Joshua K, MD  EPINEPHrine  0.3 mg/0.3 mL IJ SOAJ injection Inject 0.3 mLs (0.3 mg total) into the muscle as needed (for anaphylaxis). 12/08/17   Nadean August, DO  gabapentin  (NEURONTIN ) 100 MG capsule Take 3 capsules (300 mg total) by mouth at bedtime. 01/23/19 01/06/22  Rosezetta Contras, MD  hydrocortisone  (ANUSOL -HC) 2.5 % rectal cream Place 1 application. rectally 2 (two) times daily. 01/06/22   Edmonia Gottron, PA-C  lisinopril  (PRINIVIL ,ZESTRIL ) 40 MG tablet TAKE 1 TABLET BY MOUTH ONCE DAILY Patient taking differently: Take 40 mg by mouth daily. 04/11/18   Lee, Joshua K, MD  lovastatin  (MEVACOR ) 20 MG tablet Take 1 tablet (20 mg total) by mouth at bedtime. 01/16/18   Malvin Searing, MD  metoprolol  tartrate (LOPRESSOR ) 100 MG tablet Take 1 tablet by mouth twice daily 02/12/19   Lee, Joshua K, MD  Semaglutide , 2 MG/DOSE, (OZEMPIC , 2 MG/DOSE,) 8 MG/3ML SOPN Inject 2 mg into the skin once a week. 07/27/22     tiZANidine (ZANAFLEX) 4 MG tablet Take 4 mg by mouth 3 (three) times daily. 06/03/20   [provider]      Allergies    Ceftin [cefuroxime axetil], Shrimp [shellfish allergy], Pineapple, Adhesive [tape], and Azithromycin    Review of Systems   Review of Systems  Constitutional:  Negative for chills and fever.  Respiratory:  Negative for shortness  of breath.   Cardiovascular:  Negative for chest pain.  Gastrointestinal:  Negative for abdominal distention, constipation, diarrhea, nausea and vomiting.       Denies any fecal incontinence  Genitourinary:  Negative for dysuria and hematuria.       Denies any urinary incontinence or urinary retention.  Musculoskeletal:  Positive for back pain and myalgias.       Denies any saddle anesthesia  Neurological:  Negative for weakness and numbness.    Physical Exam Updated Vital Signs BP (!) 157/89 (BP Location: Left Arm)   Pulse 80   Temp 97.9 F (36.6 C) (Oral)   Resp 20   Ht 5\' 4"  (1.626 m)   Wt  115.7 kg   SpO2 100%   BMI 43.77 kg/m  Physical Exam Vitals and nursing note reviewed.  Constitutional:      General: She is not in acute distress.    Appearance: She is not ill-appearing or toxic-appearing.  Eyes:     General: No scleral icterus. Cardiovascular:     Rate and Rhythm: Normal rate.  Pulmonary:     Effort: Pulmonary effort is normal. No respiratory distress.  Abdominal:     Palpations: Abdomen is soft.     Tenderness: There is no abdominal tenderness. There is no guarding or rebound.  Musculoskeletal:        General: Tenderness present.     Right lower leg: No edema.     Left lower leg: No edema.     Comments: Tenderness to the lower lumbar spine and paraspinal area.  Tenderness of the right SI joint.  Patient does have tenderness diffusely to the right leg however is mainly to the lateral aspect.  Compartments are soft.  No swelling noted.  Color, temperature, and size appear and feel symmetric bilaterally.  Palpable DP and PT pulses.  Patient does have some weakness in the right lower extremity however secondary to pain.  Sensation reportedly intact symmetrically.  Skin:    General: Skin is warm and dry.  Neurological:     Mental Status: She is alert.     ED Results / Procedures / Treatments   Labs (all labs ordered are listed, but only abnormal results are displayed) Labs Reviewed  CBC WITH DIFFERENTIAL/PLATELET - Abnormal; Notable for the following components:      Result Value   HCT 34.3 (*)    All other components within normal limits  URINALYSIS, ROUTINE W REFLEX MICROSCOPIC - Abnormal; Notable for the following components:   Leukocytes,Ua SMALL (*)    Bacteria, UA RARE (*)    All other components within normal limits  COMPREHENSIVE METABOLIC PANEL WITH GFR - Abnormal; Notable for the following components:   Glucose, Bld 122 (*)    Calcium 8.7 (*)    All other components within normal limits  CK  MAGNESIUM     EKG None  Radiology VAS US  LOWER  EXTREMITY VENOUS (DVT) (ONLY [facility] & [facility]) Result Date: 02/04/2024  Lower Venous DVT Study Patient Name:  MAYZEE REICHENBACH  Date of Exam:   02/04/2024 Medical Rec #: [id number]         Accession #:    [id number] Date of Birth: June 15, 1961        Patient Gender: F Patient Age:   89 years Exam Location:  [facility] Procedure:      VAS US  LOWER EXTREMITY VENOUS (DVT) Referring Phys: Ameka Krigbaum --------------------------------------------------------------------------------  Indications: Right lateral hip pain radiating down thigh. Thigh tenderness  with pressure.  Limitations: Pain with compression maneuvers. Comparison Study: No prior study on file Performing Technologist: Carleene Chase RVS  Examination Guidelines: A complete evaluation includes B-mode imaging, spectral Doppler, color Doppler, and power Doppler as needed of all accessible portions of each vessel. Bilateral testing is considered an integral part of a complete examination. Limited examinations for reoccurring indications may be performed as noted. The reflux portion of the exam is performed with the patient in reverse Trendelenburg.  +---------+---------------+---------+-----------+----------+-------------------+ RIGHT    CompressibilityPhasicitySpontaneityPropertiesThrombus Aging      +---------+---------------+---------+-----------+----------+-------------------+ CFV      Full           Yes      Yes                                      +---------+---------------+---------+-----------+----------+-------------------+ SFJ      Full                                                             +---------+---------------+---------+-----------+----------+-------------------+ FV Prox  Full                                                             +---------+---------------+---------+-----------+----------+-------------------+ FV Mid                  Yes      Yes                  patent by color and                                                        Doppler             +---------+---------------+---------+-----------+----------+-------------------+ FV Distal               Yes      Yes                  patent by color and                                                       Doppler             +---------+---------------+---------+-----------+----------+-------------------+ PFV      Full                                                             +---------+---------------+---------+-----------+----------+-------------------+ POP      Full                                                             +---------+---------------+---------+-----------+----------+-------------------+  PTV      Full                                                             +---------+---------------+---------+-----------+----------+-------------------+ PERO     Full                                                             +---------+---------------+---------+-----------+----------+-------------------+   +----+---------------+---------+-----------+----------+--------------+ LEFTCompressibilityPhasicitySpontaneityPropertiesThrombus Aging +----+---------------+---------+-----------+----------+--------------+ CFV Full           Yes      Yes                                 +----+---------------+---------+-----------+----------+--------------+     Summary: RIGHT: - There is no evidence of deep vein thrombosis in the lower extremity.  - No cystic structure found in the popliteal fossa.  LEFT: - No evidence of common femoral vein obstruction.   *See table(s) above for measurements and observations.    Preliminary    MR Lumbar Spine W Wo Contrast Result Date: 02/04/2024 EXAM: MR Lumbar Spine With and Without Intravenous Contrast. 02/04/2024 02:07:44 PM TECHNIQUE: Multiplanar multisequence MRI of the lumbar spine was performed with and without the administration of intravenous contrast.  COMPARISON: Lumbar spine radiographs 10/02/2020. MRI of the lumbar spine without contrast. 08/27/2020. CLINICAL HISTORY: Low back pain, cauda equina syndrome suspected. FINDINGS: BONES AND ALIGNMENT: Grade 1 anterolisthesis at L4-5 has slightly progressed. The vertebral body heights are maintained. The bone marrow signal appears unremarkable. No abnormal enhancement. SPINAL CORD: The conus medullaris terminates at L1-L2. SOFT TISSUES: No abnormal enhancement is seen of the lumbar spine. No paraspinal mass identified. L1-L2: There is no significant disc protrusion, spinal canal stenosis or neural foraminal narrowing. L2-L3: There is no significant disc protrusion, spinal canal stenosis or neural foraminal narrowing. L3-L4: There is no significant disc protrusion, spinal canal stenosis or neural foraminal narrowing. L4-L5: A progressive broad-based disc protrusion with bilateral facet arthropathy results in mild subarticular narrowing, right greater than left. Foraminal stenosis is present bilaterally. L5-S1: Prominent epidural fat is present. A leftward disc protrusion is present. Mild facet arthropathy noted bilaterally. Mild subarticular narrowing is worse on the left. Progressive severe left and moderate right foraminal stenosis is present. IMPRESSION: 1. Progressive broad-based disc protrusion at L4-L5 with bilateral facet arthropathy resulting in mild subarticular narrowing (right greater than left) and bilateral foraminal stenosis. 2. Leftward disc protrusion at L5-S1 with mild facet arthropathy, mild subarticular narrowing (worse on the left), and progressive severe left and moderate right foraminal stenosis. 3. Grade 1 anterolisthesis at L4-5, slightly progressed. Electronically signed by: Audree Leas MD 02/04/2024 03:47 PM EDT RP Workstation: [id number]    Procedures Procedures    Medications Ordered in ED Medications  lidocaine  (LIDODERM ) 5 % 1 patch (1 patch Transdermal Patch Applied  02/04/24 1201)  ondansetron  (ZOFRAN -ODT) 4 MG disintegrating tablet (  Not Given 02/04/24 2115)  oxyCODONE -acetaminophen  (PERCOCET/ROXICET) 5-325 MG per tablet 1 tablet (1 tablet Oral Given 02/04/24 1148)  gadobutrol  (GADAVIST ) 1 MMOL/ML injection 10 mL (10 mLs Intravenous  Contrast Given 02/04/24 1349)  oxyCODONE -acetaminophen  (PERCOCET/ROXICET) 5-325 MG per tablet 1 tablet (1 tablet Oral Given 02/04/24 1954)  ondansetron  (ZOFRAN ) 4 MG/2ML injection (4 mg  Given 02/04/24 2113)    ED Course/ Medical Decision Making/ A&P   Medical Decision Making Amount and/or Complexity of Data Reviewed Labs: ordered. Radiology: ordered.  Risk Prescription drug management.   63 y.o. female presents to the ER for evaluation of lower back pain and right leg pain. Differential diagnosis includes but is not limited to Fracture (acute/chronic), muscle strain, cauda equina, spinal stenosis, DDD, metastatic cancer, vertebral osteomyelitis, kidney stone, pyelonephritis, AAA, pancreatitis, bowel obstruction, meningitis. Vital signs elevated BP otherwise unremarkable. Physical exam as noted above.   From looking at the patient's MRI in 2021, she does have some disc protrusions and some foraminal stenosis.  I discussed this case with my attending who recommends MRI with and without contrast.  Also recommends DVT study to rule out any blood clot that could be causing this.  However pain is to the lateral aspect.  I independently reviewed and interpreted the patient's labs. CK and mag within normal limits. CBC without leukocytosis. Mildly low hematocrit, but normal hemoglobin. CMP shows glucose 122 and calcium 8.7, otherwise no electrolyte or LFT abnormality. Urinalysis shows small leukocytes and rare bacteria otherwise unremarkable. Patient is having lower back pain and I think MSK is more likely, but will order urine culture given the findings without other symptoms.  US  DVT negative.  MRI 1. Progressive broad-based disc  protrusion at L4-L5 with bilateral facet arthropathy resulting in mild subarticular narrowing (right greater than left) and bilateral foraminal stenosis. 2. Leftward disc protrusion at L5-S1 with mild facet arthropathy, mild subarticular narrowing (worse on the left), and progressive severe left and moderate right foraminal stenosis. 3. Grade 1 anterolisthesis at L4-5, slightly progressed. Per radiologist's interpretation.    Patient did have some nausea with the oxycodone , felt better with the Zofran .  I discussed with her her MRI findings.  Likely thinks is from her chronic back pain.  She is not have any saddle anesthesia or fecal or urinary incontinence.  No fevers.  MRI does not show any emergent surgical need.  She has been ambulatory to the bathroom.  She likely is having the right leg pain due to irritated nerve from her back.  I stressed the patient that she needs to follow-up with a spine specialist given that she has such relief with her steroid injections before.  Will give her a small course of narcotic pain medication.  Given patient's diabetes, I do feel the prednisone would do more risk than benefit.  Stable for discharge home with close outpatient follow-up and strict return precautions.  We discussed the results of the labs/imaging. The plan is supportive care, follow up with back specialist. We discussed strict return precautions and red flag symptoms. The patient verbalized their understanding and agrees to the plan. The patient is stable and being discharged home in good condition.  I discussed this case with my attending physician who cosigned this note including patient's presenting symptoms, physical exam, and planned diagnostics and interventions. Attending physician stated agreement with plan or made changes to plan which were implemented.  Portions of this report may have been transcribed using voice recognition software. Every effort was made to ensure accuracy; however,  inadvertent computerized transcription errors may be present.    Final Clinical Impression(s) / ED Diagnoses Final diagnoses:  Lumbar arthropathy  Right leg pain  Lumbar radiculopathy  Rx / DC Orders ED Discharge Orders          Ordered    oxyCODONE -acetaminophen  (PERCOCET/ROXICET) 5-325 MG tablet  Every 6 hours PRN        02/04/24 2142              Spence Dux, PA-C 02/08/24 1121    Deatra Face, MD 02/11/24 [phone]

## 2024-02-04 NOTE — Discharge Instructions (Addendum)
 You were seen in the ER today for evaluation of your back pain.  Your MRI showed that you have some arthropathy in stenosis.  Ultimately, you will need to follow back up with your back specialist whether that be Gilberto Labella or your previous spine specialist.  I have also believe information for [place] [facility] for her to follow-up with us  in case.  For pain recommend taking Tylenol  as needed.  Can also try over-the-counter lidocaine  patches.  I have prescribed you a few narcotic pain medications to take for breakthrough pain.  I will send you home with some Zofran  to help with the nausea from the pain medication as well.  I included more information on the discharge report for you to evaluate.  If you have any concerns, new or worsening symptoms, please return to your nearest Emergency Department for reevaluation.  Contact a doctor if: Your pain is not controlled by medicine. Your pain does not get better. Your pain gets worse. Your pain lasts longer than 4 weeks. You lose weight without trying. Get help right away if: You cannot control when you pee (urinate) or poop (have a bowel movement). You have weakness in any of these areas and it gets worse: Lower back. The area between your hip bones. Butt. Legs. You have redness or swelling of your back. You have a burning feeling when you pee.

## 2024-02-04 NOTE — ED Notes (Signed)
 Patient transported to MRI

## 2024-02-04 NOTE — Progress Notes (Signed)
 VASCULAR LAB    Right lower extremity venous duplex has been performed.  See CV proc for preliminary results.  Gave verbal report to Dr. Annabel Kettle, Kiauna Zywicki, RVT 02/04/2024, 7:03 PM

## 2024-02-06 LAB — URINE CULTURE: Culture: NO GROWTH

## 2024-03-07 DIAGNOSIS — Z6841 Body Mass Index (BMI) 40.0 and over, adult: Secondary | ICD-10-CM | POA: Diagnosis not present

## 2024-03-07 DIAGNOSIS — M5416 Radiculopathy, lumbar region: Secondary | ICD-10-CM | POA: Diagnosis not present

## 2024-03-15 ENCOUNTER — Encounter (HOSPITAL_COMMUNITY): Payer: Self-pay | Admitting: *Deleted

## 2024-03-22 DIAGNOSIS — M5416 Radiculopathy, lumbar region: Secondary | ICD-10-CM | POA: Diagnosis not present

## 2024-03-26 DIAGNOSIS — G4733 Obstructive sleep apnea (adult) (pediatric): Secondary | ICD-10-CM | POA: Diagnosis not present

## 2024-03-28 ENCOUNTER — Ambulatory Visit

## 2024-03-30 NOTE — Therapy (Signed)
 OUTPATIENT PHYSICAL THERAPY THORACOLUMBAR EVALUATION   Patient Name: Sheila Ferguson MRN: [id number] DOB:Nov 26, 1960, 63 y.o., female Today's Date: 04/02/2024  END OF SESSION:  PT End of Session - 04/02/24 0917     Visit Number 1    Number of Visits 17    Date for PT Re-Evaluation 05/28/24    Authorization Type [employer]    Authorization Time Period auth tbd    PT Start Time 0917    PT Stop Time 0955    PT Time Calculation (min) 38 min    Activity Tolerance Patient tolerated treatment well          Past Medical History:  Diagnosis Date   Allergy    Anal fissure    Anemia    Anxiety    Arthritis    Bradycardia    slow heart rate per pt - unsure of rate    Congenital stricture of esophagus    Depression    Diabetes mellitus type II    DIABETES MELLITUS, TYPE II, UNCONTROLLED 09/01/2007   04/10/13 eye exam neg proliferative DM retinopathy, +non proliferative DM retinopathy OS and OD +amblyopia OD    Dysmetabolic syndrome    Gall bladder disease    removed   GERD (gastroesophageal reflux disease)    Headache(784.0)    Heart murmur    Hemorrhoids    Hypertension    Neuromuscular disorder (HCC)    ruptured disc in back, burcitis    Sarcoid    Sleep apnea    no cpap    Thyroid disease    no meds needed at this time    Past Surgical History:  Procedure Laterality Date   ABDOMINAL HYSTERECTOMY     CHOLECYSTECTOMY     COLONOSCOPY  2008   nesxt is 08-07-18   ESOPHAGEAL DILATION     x2   LAPAROSCOPIC GASTRIC SLEEVE RESECTION N/A 08/14/2018   Procedure: LAPAROSCOPIC GASTRIC SLEEVE RESECTION, UPPER ENDO, ERAS PATHWAY;  Surgeon: Gladis Cough, MD;  Location: [facility];  Service: General;  Laterality: N/A;   OOPHORECTOMY     right, due to endometriosis   UPPER GASTROINTESTINAL ENDOSCOPY     Patient Active Problem List   Diagnosis Date Noted   Need for 23-polyvalent pneumococcal polysaccharide vaccine 08/24/2018   Dehydration 08/21/2018   S/P laparoscopic  sleeve gastrectomy Dec 2019 08/14/2018   Peripheral neuropathy 07/14/2018   'Light-for-dates' infant with signs of fetal malnutrition 03/24/2018   Immunity status testing 04/01/2017   Severe obesity (BMI >= 40) (HCC) 10/22/2016   Lumbar radiculopathy, chronic 05/06/2016   CKD stage 2 due to type 2 diabetes mellitus (HCC) 09/04/2014   Health care maintenance 10/05/2013   Osteoarthritis of both knees 10/05/2013   Hyperlipidemia 12/25/2012   Anxiety and depression 12/28/2011   MIGRAINE HEADACHE 01/13/2008   GERD 01/13/2008   Essential hypertension 02/09/2007   Diabetes mellitus type 2 with retinopathy (HCC) 08/31/2004    PCP: Vernadine Charlie ORN, MD  REFERRING PROVIDER: Debby Dorn MATSU, MD  REFERRING DIAG: M54.16 (ICD-10-CM) - Radiculopathy, lumbar region  Rationale for Evaluation and Treatment: Rehabilitation  THERAPY DIAG:  Other low back pain  Muscle weakness (generalized)  ONSET DATE: 15+ years, worsening over 3-4 months w/ new pains  SUBJECTIVE:  SUBJECTIVE STATEMENT: Pt reports chronic history of back pain 15+ years. States she is having a new pain R side low back into hip, down into posterior thigh and foot. States she would occasionally get paresthesias in leg historically but not all the way down to leg. Pt reports weight loss surgery prior to 2020, was more active and felt that helped. Feels this episode was preceded by reducing her activities, was previously in gym about 3 days a week. Feels it is gradually worsening since onset although she had an injection last week with excellent relief.  No L sided symptoms. No bowel/bladder symptoms, no saddle anesthesia. No fevers/chills.  PERTINENT HISTORY:  anxiety/depression, DM2. GERD, headache, HTN  PAIN:  Are you having pain:  none Location/description: R sided low back down to foot Best-worst over past week: 0-6/10 - aggravating factors: transfers, lifting leg, getting on and off floor, sitting for prolonged periods, lifting, standing >10 min - Easing factors: injection, ice, heat, icy hot, pain patches    PRECAUTIONS: None  RED FLAGS: None   WEIGHT BEARING RESTRICTIONS: No  FALLS:  Has patient fallen in last 6 months? No  LIVING ENVIRONMENT: 1 level apt, lives w/ husband and son Pt states she does majority of housework Requires rest breaks, sitting No AD  OCCUPATION: retired, work part time as Midwife and helps client   PLOF: Independent  PATIENT GOALS: go back to gym, walk further  NEXT MD VISIT: unsure   OBJECTIVE:  Note: Objective measures were completed at Evaluation unless otherwise noted.  DIAGNOSTIC FINDINGS:  MRI 02/04/24 lumbar: IMPRESSION: 1. Progressive broad-based disc protrusion at L4-L5 with bilateral facet arthropathy resulting in mild subarticular narrowing (right greater than left) and bilateral foraminal stenosis. 2. Leftward disc protrusion at L5-S1 with mild facet arthropathy, mild subarticular narrowing (worse on the left), and progressive severe left and moderate right foraminal stenosis. 3. Grade 1 anterolisthesis at L4-5, slightly progressed.  PATIENT SURVEYS:  ODI: 18/50; 36%   COGNITION: Overall cognitive status: Within functional limits for tasks assessed     SENSATION: LT intact BIL LE   POSTURE: lateral shift towards L    LUMBAR ROM:   AROM eval  Flexion Pressure at proximal shin, able to push to distal shin  Extension 100% but reproduces RLE pain  Right lateral flexion Above knee  Left lateral flexion Above knee  Right rotation 50% s  Left rotation 50% * no LE pain   (Blank rows = not tested) (Key: WFL = within functional limits not formally assessed, * = concordant pain, s = stiffness/stretching sensation, NT = not tested) Comment:     LOWER EXTREMITY MMT:    MMT Right eval Left eval  Hip flexion 4- 4+  Hip abduction (modified sitting) 5 5  Hip internal rotation 4 4+  Hip external rotation 4 4+  Knee flexion 4+ 5  Knee extension 5 5  Ankle dorsiflexion 5 5   (Blank rows = not tested) (Key: WFL = within functional limits not formally assessed, * = concordant pain, s = stiffness/stretching sensation, NT = not tested)  Comments:    LUMBAR SPECIAL TESTS:  Slump + RLE, - LLE  FUNCTIONAL TESTS:  5xSTS: 13.39sec w UE support, 13.34sec w/o UE support  GAIT: Distance walked: within clinic Assistive device utilized: None Level of assistance: Complete Independence Comments: mildly reduced gait speed/cadence  TREATMENT DATE:  [facility] Adult PT Treatment:  DATE: 04/02/24 Therapeutic Exercise: STS practice Sciatic nerve glides practice, education on modification PRN Hip add isometric x8 HEP education/handout, rationale for interventions, relevant anatomy/physiology, modification as indicated                                                                                                                              PATIENT EDUCATION:  Education details: Pt education on PT impairments, prognosis, and POC. Informed consent. Rationale for interventions, safe/appropriate HEP performance Person educated: Patient Education method: Explanation, Demonstration, Tactile cues, Verbal cues Education comprehension: verbalized understanding, returned demonstration, verbal cues required, tactile cues required, and needs further education    HOME EXERCISE PROGRAM: Access Code: [email or web address] URL: [email or web address] Date: 04/02/2024 Prepared by: Alm Jenny  Exercises - Sit to Stand with Armchair  - 2-3 x daily - 1 sets - 5 reps - Seated Hip Adduction Isometrics with Ball  - 2-3 x daily - 1 sets - 8-10 reps - Seated Sciatic Tensioner  - 2-3 x daily - 1 sets - 8-10  reps  ASSESSMENT:  CLINICAL IMPRESSION: Patient is a pleasant 63 y.o. woman who was seen today for physical therapy evaluation and treatment for chronic back pain with exacerbation over past few months, now having distal RLE referral. States this is limiting her participation in household tasks, recreational activities, and community navigation. No red flags today. On exam she demonstrates concordant limitations in lumbar ROM with extension reproducing RLE symptoms. Also demonstrates positive slump test RLE, mild RLE weakness, limitation in 5xSTS which is around cutoff score for fall risk (generally 12-14 sec). No adverse events, tolerates exam/HEP well overall. Recommend trial of skilled PT to address aforementioned deficits with aim of improving functional tolerance and reducing pain with typical activities. Pt departs today's session in no acute distress, all voiced concerns/questions addressed appropriately from PT perspective.      OBJECTIVE IMPAIRMENTS: Abnormal gait, decreased activity tolerance, decreased endurance, decreased mobility, difficulty walking, decreased ROM, decreased strength, impaired perceived functional ability, improper body mechanics, postural dysfunction, and pain.   ACTIVITY LIMITATIONS: carrying, lifting, bending, sitting, standing, squatting, sleeping, transfers, and locomotion level  PARTICIPATION LIMITATIONS: meal prep, cleaning, laundry, and community activity  PERSONAL FACTORS: Age, Time since onset of injury/illness/exacerbation, and 3+ comorbidities: anxiety/depression, DM2. GERD, headache, HTN are also affecting patient's functional outcome.   REHAB POTENTIAL: Good  CLINICAL DECISION MAKING: Evolving/moderate complexity  EVALUATION COMPLEXITY: Moderate   GOALS:   SHORT TERM GOALS: Target date: 04/30/2024  Pt will demonstrate appropriate understanding and performance of initially prescribed HEP in order to facilitate improved independence with management  of symptoms.  Baseline: HEP established  Goal status: INITIAL   2. Pt will report at least 25% improvement in overall pain levels over past week in order to facilitate improved tolerance to typical daily activities.   Baseline: 0-6/10  Goal status: INITIAL    LONG TERM GOALS: Target date: 05/28/2024   Pt will improve at least 20% on ODI in  order to demonstrate improved perception of functional status due to symptoms.  Baseline: 36% Goal status: INITIAL  2.  Pt will demonstrate full lumbar extension AROM without RLE symptoms in order to demonstrate improved tolerance to functional movement patterns.  Baseline: full w/ RLE symptoms Goal status: INITIAL  3.  Pt will demonstrate symmetrical hip/knee MMT of at least 4+/5  in order to demonstrate improved strength for functional movements.  Baseline: see MMT chart above Goal status: INITIAL  4. Pt will perform 5xSTS in </=10 sec in order to demonstrate reduced fall risk and improved functional independence. (MCID of 2.3sec)  Baseline: 13sec w/ and w/o UE support  Goal status: INITIAL   5. Pt will report at least 50% decrease in overall pain levels in past week in order to facilitate improved tolerance to basic ADLs/mobility.   Baseline: 0-6/10  Goal status: INITIAL    6. Pt will endorse ability to stand for up to 30 min w/ less than 3pt increase in pain on NPS in order to improve tolerance to household tasks.   Baseline: <87min  Goal status: INITIAL  PLAN:  PT FREQUENCY: 2x/week  PT DURATION: 8 weeks  PLANNED INTERVENTIONS: 97164- PT Re-evaluation, 97750- Physical Performance Testing, 97110-Therapeutic exercises, 97530- Therapeutic activity, 97112- Neuromuscular re-education, 97535- Self Care, [id number]- Manual therapy, [phone]- Gait training, [phone]- Aquatic Therapy, [phone]- Electrical stimulation (unattended), [phone] (1-2 muscles), [phone] (3+ muscles)- Dry Needling, Patient/Family education, Balance training, Stair training, Taping, Joint  mobilization, Spinal mobilization, Cryotherapy, and Moist heat.  PLAN FOR NEXT SESSION: Review/update HEP PRN. Work on [employer] exercises as appropriate with emphasis on core stability, comfortable lumbar/hip mobility, nerve desensitization, LE strengthening. Symptom modification strategies as indicated/appropriate.    Alm DELENA Jenny PT, DPT 04/02/2024 11:51 AM    For all possible CPT codes, reference the Planned Interventions line above.     Check all conditions that are expected to impact treatment: {Conditions expected to impact treatment:Musculoskeletal disorders

## 2024-04-02 ENCOUNTER — Encounter: Payer: Self-pay | Admitting: Physical Therapy

## 2024-04-02 ENCOUNTER — Other Ambulatory Visit: Payer: Self-pay

## 2024-04-02 ENCOUNTER — Ambulatory Visit: Attending: Neurosurgery | Admitting: Physical Therapy

## 2024-04-02 DIAGNOSIS — M5459 Other low back pain: Secondary | ICD-10-CM | POA: Diagnosis not present

## 2024-04-02 DIAGNOSIS — M6281 Muscle weakness (generalized): Secondary | ICD-10-CM | POA: Insufficient documentation

## 2024-04-11 ENCOUNTER — Ambulatory Visit

## 2024-04-13 ENCOUNTER — Encounter: Admitting: Physical Therapy

## 2024-04-16 ENCOUNTER — Encounter: Payer: Self-pay | Admitting: Physical Therapy

## 2024-04-16 ENCOUNTER — Ambulatory Visit: Attending: Neurosurgery | Admitting: Physical Therapy

## 2024-04-16 DIAGNOSIS — M5459 Other low back pain: Secondary | ICD-10-CM | POA: Insufficient documentation

## 2024-04-16 DIAGNOSIS — M6281 Muscle weakness (generalized): Secondary | ICD-10-CM | POA: Insufficient documentation

## 2024-04-16 NOTE — Therapy (Signed)
 OUTPATIENT PHYSICAL THERAPY TREATMENT   Patient Name: Sheila Ferguson MRN: [id number] DOB:1961-03-24, 63 y.o., female Today's Date: 04/16/2024  END OF SESSION:  PT End of Session - 04/16/24 1002     Visit Number 2    Number of Visits 17    Date for PT Re-Evaluation 05/28/24    Authorization Type [employer]    Authorization Time Period 6 visits 04/02/24-05/31/24    Authorization - Visit Number 2    Authorization - Number of Visits 6    PT Start Time 1002    PT Stop Time 1041    PT Time Calculation (min) 39 min    Activity Tolerance Patient tolerated treatment well           Past Medical History:  Diagnosis Date   Allergy    Anal fissure    Anemia    Anxiety    Arthritis    Bradycardia    slow heart rate per pt - unsure of rate    Congenital stricture of esophagus    Depression    Diabetes mellitus type II    DIABETES MELLITUS, TYPE II, UNCONTROLLED 09/01/2007   04/10/13 eye exam neg proliferative DM retinopathy, +non proliferative DM retinopathy OS and OD +amblyopia OD    Dysmetabolic syndrome    Gall bladder disease    removed   GERD (gastroesophageal reflux disease)    Headache(784.0)    Heart murmur    Hemorrhoids    Hypertension    Neuromuscular disorder (HCC)    ruptured disc in back, burcitis    Sarcoid    Sleep apnea    no cpap    Thyroid disease    no meds needed at this time    Past Surgical History:  Procedure Laterality Date   ABDOMINAL HYSTERECTOMY     CHOLECYSTECTOMY     COLONOSCOPY  2008   nesxt is 08-07-18   ESOPHAGEAL DILATION     x2   LAPAROSCOPIC GASTRIC SLEEVE RESECTION N/A 08/14/2018   Procedure: LAPAROSCOPIC GASTRIC SLEEVE RESECTION, UPPER ENDO, ERAS PATHWAY;  Surgeon: Gladis Cough, MD;  Location: [facility];  Service: General;  Laterality: N/A;   OOPHORECTOMY     right, due to endometriosis   UPPER GASTROINTESTINAL ENDOSCOPY     Patient Active Problem List   Diagnosis Date Noted   Need for 23-polyvalent pneumococcal  polysaccharide vaccine 08/24/2018   Dehydration 08/21/2018   S/P laparoscopic sleeve gastrectomy Dec 2019 08/14/2018   Peripheral neuropathy 07/14/2018   'Light-for-dates' infant with signs of fetal malnutrition 03/24/2018   Immunity status testing 04/01/2017   Severe obesity (BMI >= 40) (HCC) 10/22/2016   Lumbar radiculopathy, chronic 05/06/2016   CKD stage 2 due to type 2 diabetes mellitus (HCC) 09/04/2014   Health care maintenance 10/05/2013   Osteoarthritis of both knees 10/05/2013   Hyperlipidemia 12/25/2012   Anxiety and depression 12/28/2011   MIGRAINE HEADACHE 01/13/2008   GERD 01/13/2008   Essential hypertension 02/09/2007   Diabetes mellitus type 2 with retinopathy (HCC) 08/31/2004    PCP: Vernadine Charlie ORN, MD  REFERRING PROVIDER: Debby Dorn MATSU, MD  REFERRING DIAG: M54.16 (ICD-10-CM) - Radiculopathy, lumbar region  Rationale for Evaluation and Treatment: Rehabilitation  THERAPY DIAG:  Other low back pain  Muscle weakness (generalized)  ONSET DATE: 15+ years, worsening over 3-4 months w/ new pains  SUBJECTIVE:  Per eval: Pt reports chronic history of back pain 15+ years. States she is having a new pain R side low back into hip, down into posterior thigh and foot. States she would occasionally get paresthesias in leg historically but not all the way down to leg. Pt reports weight loss surgery prior to 2020, was more active and felt that helped. Feels this episode was preceded by reducing her activities, was previously in gym about 3 days a week. Feels it is gradually worsening since onset although she had an injection last week with excellent relief.  No L sided symptoms. No bowel/bladder symptoms, no saddle anesthesia. No fevers/chills.  SUBJECTIVE STATEMENT: 04/16/2024: states she  has been dealing with blood sugar issues virus, communicating with her doctor and following up with them soon. States her back was a little irritated after initial eval, but no longer having RLE symptoms. Hasn't been able to do HEP  PERTINENT HISTORY:  anxiety/depression, DM2. GERD, headache, HTN  PAIN:  Are you having pain: none at rest, 4/10 with standing/walking  Per eval; Location/description: R sided low back down to foot Best-worst over past week: 0-6/10 - aggravating factors: transfers, lifting leg, getting on and off floor, sitting for prolonged periods, lifting, standing >10 min - Easing factors: injection, ice, heat, icy hot, pain patches    PRECAUTIONS: None  RED FLAGS: None   WEIGHT BEARING RESTRICTIONS: No  FALLS:  Has patient fallen in last 6 months? No  LIVING ENVIRONMENT: 1 level apt, lives w/ husband and son Pt states she does majority of housework Requires rest breaks, sitting No AD  OCCUPATION: retired, work part time as Midwife and helps client   PLOF: Independent  PATIENT GOALS: go back to gym, walk further  NEXT MD VISIT: unsure   OBJECTIVE:  Note: Objective measures were completed at Evaluation unless otherwise noted.  DIAGNOSTIC FINDINGS:  MRI 02/04/24 lumbar: IMPRESSION: 1. Progressive broad-based disc protrusion at L4-L5 with bilateral facet arthropathy resulting in mild subarticular narrowing (right greater than left) and bilateral foraminal stenosis. 2. Leftward disc protrusion at L5-S1 with mild facet arthropathy, mild subarticular narrowing (worse on the left), and progressive severe left and moderate right foraminal stenosis. 3. Grade 1 anterolisthesis at L4-5, slightly progressed.  PATIENT SURVEYS:  ODI: 18/50; 36%   COGNITION: Overall cognitive status: Within functional limits for tasks assessed     SENSATION: LT intact BIL LE   POSTURE: lateral shift towards L    LUMBAR ROM:   AROM eval  Flexion Pressure at  proximal shin, able to push to distal shin  Extension 100% but reproduces RLE pain  Right lateral flexion Above knee  Left lateral flexion Above knee  Right rotation 50% s  Left rotation 50% * no LE pain   (Blank rows = not tested) (Key: WFL = within functional limits not formally assessed, * = concordant pain, s = stiffness/stretching sensation, NT = not tested) Comment:    LOWER EXTREMITY MMT:    MMT Right eval Left eval  Hip flexion 4- 4+  Hip abduction (modified sitting) 5 5  Hip internal rotation 4 4+  Hip external rotation 4 4+  Knee flexion 4+ 5  Knee extension 5 5  Ankle dorsiflexion 5 5   (Blank rows = not tested) (Key: WFL = within functional limits not formally assessed, * = concordant pain, s = stiffness/stretching sensation, NT = not tested)  Comments:    LUMBAR SPECIAL TESTS:  Slump + RLE, - LLE  FUNCTIONAL TESTS:  5xSTS: 13.39sec w UE support, 13.34sec w/o UE support  GAIT: Distance walked: within clinic Assistive device utilized: None Level of assistance: Complete Independence Comments: mildly reduced gait speed/cadence  TREATMENT DATE:  [facility] Adult PT Treatment:                                                DATE: 04/16/24  Neuromuscular re-ed: Hip add iso 2x10 cues for pacing and core contraction, breath control Sciatic nerve glides x10 STS + foam roll push down for core activation x8 cues for core contraction and pacing Red band paloff x8 BIL   Therapeutic Activity: STS x8 cues for mechanics 10# suitcase carry 2x38ft BIL cues for posture    [facility] Adult PT Treatment:                                                DATE: 04/02/24 Therapeutic Exercise: STS practice Sciatic nerve glides practice, education on modification PRN Hip add isometric x8 HEP education/handout, rationale for interventions, relevant anatomy/physiology, modification as indicated                                                                                                                               PATIENT EDUCATION:  Education details: rationale for interventions, HEP  Person educated: Patient Education method: Explanation, Demonstration, Tactile cues, Verbal cues Education comprehension: verbalized understanding, returned demonstration, verbal cues required, tactile cues required, and needs further education     HOME EXERCISE PROGRAM: Access Code: [email or web address] URL: [email or web address] Date: 04/02/2024 Prepared by: Alm Jenny  Exercises - Sit to Stand with Armchair  - 2-3 x daily - 1 sets - 5 reps - Seated Hip Adduction Isometrics with Ball  - 2-3 x daily - 1 sets - 8-10 reps - Seated Sciatic Tensioner  - 2-3 x daily - 1 sets - 8-10 reps  ASSESSMENT:  CLINICAL IMPRESSION: 04/16/2024: pt arrives w/ report of baseline symptoms. Today we focus on HEP review which she does well with. Does require cues as above, tendency to compensate w/ momentum. Tolerates session well overall, reports modest improvement in pain down to 3/10 by end of session although she does endorse fairly significant muscular fatigue in R low back and glute on departure - most difficulty noted with paloff press. Education throughout on rationale for interventions, relevant anatomy/physiology and monitoring symptom response. No adverse events. Recommend continuing along current POC in order to address relevant deficits and improve functional tolerance. Pt departs today's session in no acute distress, all voiced questions/concerns addressed appropriately from PT perspective.    Per eval: Patient is a pleasant 63 y.o. woman who was seen today for physical therapy evaluation and treatment for  chronic back pain with exacerbation over past few months, now having distal RLE referral. States this is limiting her participation in household tasks, recreational activities, and community navigation. No red flags today. On exam she demonstrates concordant limitations in lumbar ROM with  extension reproducing RLE symptoms. Also demonstrates positive slump test RLE, mild RLE weakness, limitation in 5xSTS which is around cutoff score for fall risk (generally 12-14 sec). No adverse events, tolerates exam/HEP well overall. Recommend trial of skilled PT to address aforementioned deficits with aim of improving functional tolerance and reducing pain with typical activities. Pt departs today's session in no acute distress, all voiced concerns/questions addressed appropriately from PT perspective.      OBJECTIVE IMPAIRMENTS: Abnormal gait, decreased activity tolerance, decreased endurance, decreased mobility, difficulty walking, decreased ROM, decreased strength, impaired perceived functional ability, improper body mechanics, postural dysfunction, and pain.   ACTIVITY LIMITATIONS: carrying, lifting, bending, sitting, standing, squatting, sleeping, transfers, and locomotion level  PARTICIPATION LIMITATIONS: meal prep, cleaning, laundry, and community activity  PERSONAL FACTORS: Age, Time since onset of injury/illness/exacerbation, and 3+ comorbidities: anxiety/depression, DM2. GERD, headache, HTN are also affecting patient's functional outcome.   REHAB POTENTIAL: Good  CLINICAL DECISION MAKING: Evolving/moderate complexity  EVALUATION COMPLEXITY: Moderate   GOALS:   SHORT TERM GOALS: Target date: 04/30/2024  Pt will demonstrate appropriate understanding and performance of initially prescribed HEP in order to facilitate improved independence with management of symptoms.  Baseline: HEP established  Goal status: INITIAL   2. Pt will report at least 25% improvement in overall pain levels over past week in order to facilitate improved tolerance to typical daily activities.   Baseline: 0-6/10  Goal status: INITIAL    LONG TERM GOALS: Target date: 05/28/2024   Pt will improve at least 20% on ODI in order to demonstrate improved perception of functional status due to symptoms.   Baseline: 36% Goal status: INITIAL  2.  Pt will demonstrate full lumbar extension AROM without RLE symptoms in order to demonstrate improved tolerance to functional movement patterns.  Baseline: full w/ RLE symptoms Goal status: INITIAL  3.  Pt will demonstrate symmetrical hip/knee MMT of at least 4+/5  in order to demonstrate improved strength for functional movements.  Baseline: see MMT chart above Goal status: INITIAL  4. Pt will perform 5xSTS in </=10 sec in order to demonstrate reduced fall risk and improved functional independence. (MCID of 2.3sec)  Baseline: 13sec w/ and w/o UE support  Goal status: INITIAL   5. Pt will report at least 50% decrease in overall pain levels in past week in order to facilitate improved tolerance to basic ADLs/mobility.   Baseline: 0-6/10  Goal status: INITIAL    6. Pt will endorse ability to stand for up to 30 min w/ less than 3pt increase in pain on NPS in order to improve tolerance to household tasks.   Baseline: <55min  Goal status: INITIAL  PLAN:  PT FREQUENCY: 2x/week  PT DURATION: 8 weeks  PLANNED INTERVENTIONS: 97164- PT Re-evaluation, 97750- Physical Performance Testing, 97110-Therapeutic exercises, 97530- Therapeutic activity, 97112- Neuromuscular re-education, 97535- Self Care, [id number]- Manual therapy, [phone]- Gait training, [phone]- Aquatic Therapy, [phone]- Electrical stimulation (unattended), [phone] (1-2 muscles), [phone] (3+ muscles)- Dry Needling, Patient/Family education, Balance training, Stair training, Taping, Joint mobilization, Spinal mobilization, Cryotherapy, and Moist heat.  PLAN FOR NEXT SESSION: Review/update HEP PRN. Work on [employer] exercises as appropriate with emphasis on core stability, comfortable lumbar/hip mobility, nerve desensitization, LE strengthening. Symptom modification strategies as indicated/appropriate.    Alm  A Corianna Avallone PT, DPT 04/16/2024 10:45 AM

## 2024-04-18 ENCOUNTER — Ambulatory Visit

## 2024-04-18 DIAGNOSIS — M6281 Muscle weakness (generalized): Secondary | ICD-10-CM

## 2024-04-18 DIAGNOSIS — H5213 Myopia, bilateral: Secondary | ICD-10-CM | POA: Diagnosis not present

## 2024-04-18 DIAGNOSIS — M5459 Other low back pain: Secondary | ICD-10-CM

## 2024-04-18 NOTE — Therapy (Signed)
 OUTPATIENT PHYSICAL THERAPY TREATMENT   Patient Name: Sheila Ferguson MRN: [id number] DOB:[birth year], 63 y.o., female Today's Date: 04/18/2024  END OF SESSION:  PT End of Session - 04/18/24 0914     Visit Number 3    Number of Visits 17    Date for PT Re-Evaluation 05/28/24    Authorization Type [employer]    Authorization Time Period 6 visits 04/02/24-05/31/24    Authorization - Visit Number 3    Authorization - Number of Visits 6    PT Start Time 0915    PT Stop Time 0953    PT Time Calculation (min) 38 min    Activity Tolerance Patient tolerated treatment well    Behavior During Therapy [facility] for tasks assessed/performed          Past Medical History:  Diagnosis Date   Allergy    Anal fissure    Anemia    Anxiety    Arthritis    Bradycardia    slow heart rate per pt - unsure of rate    Congenital stricture of esophagus    Depression    Diabetes mellitus type II    DIABETES MELLITUS, TYPE II, UNCONTROLLED 09/01/2007   04/10/13 eye exam neg proliferative DM retinopathy, +non proliferative DM retinopathy OS and OD +amblyopia OD    Dysmetabolic syndrome    Gall bladder disease    removed   GERD (gastroesophageal reflux disease)    Headache(784.0)    Heart murmur    Hemorrhoids    Hypertension    Neuromuscular disorder (HCC)    ruptured disc in back, burcitis    Sarcoid    Sleep apnea    no cpap    Thyroid disease    no meds needed at this time    Past Surgical History:  Procedure Laterality Date   ABDOMINAL HYSTERECTOMY     CHOLECYSTECTOMY     COLONOSCOPY  2008   nesxt is 08-07-18   ESOPHAGEAL DILATION     x2   LAPAROSCOPIC GASTRIC SLEEVE RESECTION N/A 08/14/2018   Procedure: LAPAROSCOPIC GASTRIC SLEEVE RESECTION, UPPER ENDO, ERAS PATHWAY;  Surgeon: Gladis Cough, MD;  Location: [facility];  Service: General;  Laterality: N/A;   OOPHORECTOMY     right, due to endometriosis   UPPER GASTROINTESTINAL ENDOSCOPY     Patient Active Problem List    Diagnosis Date Noted   Need for 23-polyvalent pneumococcal polysaccharide vaccine 08/24/2018   Dehydration 08/21/2018   S/P laparoscopic sleeve gastrectomy Dec 2019 08/14/2018   Peripheral neuropathy 07/14/2018   'Light-for-dates' infant with signs of fetal malnutrition 03/24/2018   Immunity status testing 04/01/2017   Severe obesity (BMI >= 40) (HCC) 10/22/2016   Lumbar radiculopathy, chronic 05/06/2016   CKD stage 2 due to type 2 diabetes mellitus (HCC) 09/04/2014   Health care maintenance 10/05/2013   Osteoarthritis of both knees 10/05/2013   Hyperlipidemia 12/25/2012   Anxiety and depression 12/28/2011   MIGRAINE HEADACHE 01/13/2008   GERD 01/13/2008   Essential hypertension 02/09/2007   Diabetes mellitus type 2 with retinopathy (HCC) 08/31/2004    PCP: Vernadine Charlie ORN, MD  REFERRING PROVIDER: Debby Dorn MATSU, MD  REFERRING DIAG: M54.16 (ICD-10-CM) - Radiculopathy, lumbar region  Rationale for Evaluation and Treatment: Rehabilitation  THERAPY DIAG:  Other low back pain  Muscle weakness (generalized)  ONSET DATE: 15+ years, worsening over 3-4 months w/ new pains  SUBJECTIVE:  Per eval: Pt reports chronic history of back pain 15+ years. States she is having a new pain R side low back into hip, down into posterior thigh and foot. States she would occasionally get paresthesias in leg historically but not all the way down to leg. Pt reports weight loss surgery prior to 2020, was more active and felt that helped. Feels this episode was preceded by reducing her activities, was previously in gym about 3 days a week. Feels it is gradually worsening since onset although she had an injection last week with excellent relief.  No L sided symptoms. No bowel/bladder symptoms, no saddle anesthesia. No  fevers/chills.  SUBJECTIVE STATEMENT: Patient reports that she is still having soreness from previous session, also states that her Rt knee is bothering her today. She is wearing a couple of patches on her back today, is unsure what they are, but knows that it is not lidocaine .   PERTINENT HISTORY:  anxiety/depression, DM2. GERD, headache, HTN  PAIN:  Are you having pain: none at rest, 4/10 with standing/walking  Per eval; Location/description: R sided low back down to foot Best-worst over past week: 0-6/10 - aggravating factors: transfers, lifting leg, getting on and off floor, sitting for prolonged periods, lifting, standing >10 min - Easing factors: injection, ice, heat, icy hot, pain patches    PRECAUTIONS: None  RED FLAGS: None   WEIGHT BEARING RESTRICTIONS: No  FALLS:  Has patient fallen in last 6 months? No  LIVING ENVIRONMENT: 1 level apt, lives w/ husband and son Pt states she does majority of housework Requires rest breaks, sitting No AD  OCCUPATION: retired, work part time as Midwife and helps client   PLOF: Independent  PATIENT GOALS: go back to gym, walk further  NEXT MD VISIT: unsure   OBJECTIVE:  Note: Objective measures were completed at Evaluation unless otherwise noted.  DIAGNOSTIC FINDINGS:  MRI 02/04/24 lumbar: IMPRESSION: 1. Progressive broad-based disc protrusion at L4-L5 with bilateral facet arthropathy resulting in mild subarticular narrowing (right greater than left) and bilateral foraminal stenosis. 2. Leftward disc protrusion at L5-S1 with mild facet arthropathy, mild subarticular narrowing (worse on the left), and progressive severe left and moderate right foraminal stenosis. 3. Grade 1 anterolisthesis at L4-5, slightly progressed.  PATIENT SURVEYS:  ODI: 18/50; 36%   COGNITION: Overall cognitive status: Within functional limits for tasks assessed     SENSATION: LT intact BIL LE   POSTURE: lateral shift towards  L    LUMBAR ROM:   AROM eval  Flexion Pressure at proximal shin, able to push to distal shin  Extension 100% but reproduces RLE pain  Right lateral flexion Above knee  Left lateral flexion Above knee  Right rotation 50% s  Left rotation 50% * no LE pain   (Blank rows = not tested) (Key: WFL = within functional limits not formally assessed, * = concordant pain, s = stiffness/stretching sensation, NT = not tested) Comment:    LOWER EXTREMITY MMT:    MMT Right eval Left eval  Hip flexion 4- 4+  Hip abduction (modified sitting) 5 5  Hip internal rotation 4 4+  Hip external rotation 4 4+  Knee flexion 4+ 5  Knee extension 5 5  Ankle dorsiflexion 5 5   (Blank rows = not tested) (Key: WFL = within functional limits not formally assessed, * = concordant pain, s = stiffness/stretching sensation, NT = not tested)  Comments:    LUMBAR SPECIAL TESTS:  Slump + RLE, - LLE  FUNCTIONAL TESTS:  5xSTS: 13.39sec w UE support, 13.34sec w/o UE support  GAIT: Distance walked: within clinic Assistive device utilized: None Level of assistance: Complete Independence Comments: mildly reduced gait speed/cadence  TREATMENT DATE:  [facility] Adult PT Treatment:                                                DATE: 04/18/24 Therapeutic Exercise: Nustep level 5 x 8 mins while gathering subjective and planning session with patient Seated pball roll outs fwd/lat x10 ea Neuromuscular re-ed: Hip add iso 2x10 cues for pacing and core contraction, breath control Sciatic nerve glides 2x10 STS + foam roll push down for core activation 2x8 cues for core contraction and pacing Red band paloff x8 BIL  Therapeutic Activity: STS x8 cues for mechanics 10# suitcase carry 2x73ft BIL cues for posture  [facility] Adult PT Treatment:                                                DATE: 04/16/24  Neuromuscular re-ed: Hip add iso 2x10 cues for pacing and core contraction, breath control Sciatic nerve glides x10 STS +  foam roll push down for core activation x8 cues for core contraction and pacing Red band paloff x8 BIL   Therapeutic Activity: STS x8 cues for mechanics 10# suitcase carry 2x43ft BIL cues for posture    [facility] Adult PT Treatment:                                                DATE: 04/02/24 Therapeutic Exercise: STS practice Sciatic nerve glides practice, education on modification PRN Hip add isometric x8 HEP education/handout, rationale for interventions, relevant anatomy/physiology, modification as indicated                                                                                                                              PATIENT EDUCATION:  Education details: rationale for interventions, HEP  Person educated: Patient Education method: Explanation, Demonstration, Tactile cues, Verbal cues Education comprehension: verbalized understanding, returned demonstration, verbal cues required, tactile cues required, and needs further education     HOME EXERCISE PROGRAM: Access Code: [email or web address] URL: [email or web address] Date: 04/02/2024 Prepared by: Alm Jenny  Exercises - Sit to Stand with Armchair  - 2-3 x daily - 1 sets - 5 reps - Seated Hip Adduction Isometrics with Ball  - 2-3 x daily - 1 sets - 8-10 reps - Seated Sciatic Tensioner  - 2-3 x daily - 1 sets - 8-10 reps  ASSESSMENT:  CLINICAL IMPRESSION: Patient presents  to PT reporting increased lower back and Rt knee pain that she attributes to previous session as well as the recent rainy weather. Session today continued to focus on gentle strengthening and mobility. She has more pain in the Rt knee than her back with exercises today. Patient continues to benefit from skilled PT services and should be progressed as able to improve functional independence.   Per eval: Patient is a pleasant 63 y.o. woman who was seen today for physical therapy evaluation and treatment for chronic back pain with exacerbation over  past few months, now having distal RLE referral. States this is limiting her participation in household tasks, recreational activities, and community navigation. No red flags today. On exam she demonstrates concordant limitations in lumbar ROM with extension reproducing RLE symptoms. Also demonstrates positive slump test RLE, mild RLE weakness, limitation in 5xSTS which is around cutoff score for fall risk (generally 12-14 sec). No adverse events, tolerates exam/HEP well overall. Recommend trial of skilled PT to address aforementioned deficits with aim of improving functional tolerance and reducing pain with typical activities. Pt departs today's session in no acute distress, all voiced concerns/questions addressed appropriately from PT perspective.      OBJECTIVE IMPAIRMENTS: Abnormal gait, decreased activity tolerance, decreased endurance, decreased mobility, difficulty walking, decreased ROM, decreased strength, impaired perceived functional ability, improper body mechanics, postural dysfunction, and pain.   ACTIVITY LIMITATIONS: carrying, lifting, bending, sitting, standing, squatting, sleeping, transfers, and locomotion level  PARTICIPATION LIMITATIONS: meal prep, cleaning, laundry, and community activity  PERSONAL FACTORS: Age, Time since onset of injury/illness/exacerbation, and 3+ comorbidities: anxiety/depression, DM2. GERD, headache, HTN are also affecting patient's functional outcome.   REHAB POTENTIAL: Good  CLINICAL DECISION MAKING: Evolving/moderate complexity  EVALUATION COMPLEXITY: Moderate   GOALS:   SHORT TERM GOALS: Target date: 04/30/2024  Pt will demonstrate appropriate understanding and performance of initially prescribed HEP in order to facilitate improved independence with management of symptoms.  Baseline: HEP established  Goal status: INITIAL   2. Pt will report at least 25% improvement in overall pain levels over past week in order to facilitate improved tolerance  to typical daily activities.   Baseline: 0-6/10  Goal status: INITIAL    LONG TERM GOALS: Target date: 05/28/2024   Pt will improve at least 20% on ODI in order to demonstrate improved perception of functional status due to symptoms.  Baseline: 36% Goal status: INITIAL  2.  Pt will demonstrate full lumbar extension AROM without RLE symptoms in order to demonstrate improved tolerance to functional movement patterns.  Baseline: full w/ RLE symptoms Goal status: INITIAL  3.  Pt will demonstrate symmetrical hip/knee MMT of at least 4+/5  in order to demonstrate improved strength for functional movements.  Baseline: see MMT chart above Goal status: INITIAL  4. Pt will perform 5xSTS in </=10 sec in order to demonstrate reduced fall risk and improved functional independence. (MCID of 2.3sec)  Baseline: 13sec w/ and w/o UE support  Goal status: INITIAL   5. Pt will report at least 50% decrease in overall pain levels in past week in order to facilitate improved tolerance to basic ADLs/mobility.   Baseline: 0-6/10  Goal status: INITIAL    6. Pt will endorse ability to stand for up to 30 min w/ less than 3pt increase in pain on NPS in order to improve tolerance to household tasks.   Baseline: <44min  Goal status: INITIAL  PLAN:  PT FREQUENCY: 2x/week  PT DURATION: 8 weeks  PLANNED INTERVENTIONS: 02835- PT  Re-evaluation, 97750- Physical Performance Testing, 97110-Therapeutic exercises, 97530- Therapeutic activity, [id number]- Neuromuscular re-education, [phone]- Self Care, [phone]- Manual therapy, [phone]- Gait training, [phone]- Aquatic Therapy, [phone]- Electrical stimulation (unattended), [phone] (1-2 muscles), [phone] (3+ muscles)- Dry Needling, Patient/Family education, Balance training, Stair training, Taping, Joint mobilization, Spinal mobilization, Cryotherapy, and Moist heat.  PLAN FOR NEXT SESSION: Review/update HEP PRN. Work on [employer] exercises as appropriate with emphasis on core stability,  comfortable lumbar/hip mobility, nerve desensitization, LE strengthening. Symptom modification strategies as indicated/appropriate.    Corean Pouch PTA  04/18/2024 9:53 AM

## 2024-04-23 ENCOUNTER — Encounter: Payer: Self-pay | Admitting: Physical Therapy

## 2024-04-23 ENCOUNTER — Ambulatory Visit: Admitting: Physical Therapy

## 2024-04-23 DIAGNOSIS — M5459 Other low back pain: Secondary | ICD-10-CM

## 2024-04-23 DIAGNOSIS — M6281 Muscle weakness (generalized): Secondary | ICD-10-CM

## 2024-04-23 NOTE — Therapy (Signed)
 OUTPATIENT PHYSICAL THERAPY TREATMENT   Patient Name: Sheila Ferguson MRN: [id number] DOB:Jan 18, 1961, 63 y.o., female Today's Date: 04/23/2024  END OF SESSION:  PT End of Session - 04/23/24 1020     Visit Number 4    Number of Visits 17    Date for PT Re-Evaluation 05/28/24    Authorization Type [employer]    Authorization Time Period 6 visits 04/02/24-05/31/24    Authorization - Visit Number 4    Authorization - Number of Visits 6    PT Start Time 1020    PT Stop Time 1100    PT Time Calculation (min) 40 min    Activity Tolerance Patient tolerated treatment well    Behavior During Therapy WFL for tasks assessed/performed           Past Medical History:  Diagnosis Date   Allergy    Anal fissure    Anemia    Anxiety    Arthritis    Bradycardia    slow heart rate per pt - unsure of rate    Congenital stricture of esophagus    Depression    Diabetes mellitus type II    DIABETES MELLITUS, TYPE II, UNCONTROLLED 09/01/2007   04/10/13 eye exam neg proliferative DM retinopathy, +non proliferative DM retinopathy OS and OD +amblyopia OD    Dysmetabolic syndrome    Gall bladder disease    removed   GERD (gastroesophageal reflux disease)    Headache(784.0)    Heart murmur    Hemorrhoids    Hypertension    Neuromuscular disorder (HCC)    ruptured disc in back, burcitis    Sarcoid    Sleep apnea    no cpap    Thyroid disease    no meds needed at this time    Past Surgical History:  Procedure Laterality Date   ABDOMINAL HYSTERECTOMY     CHOLECYSTECTOMY     COLONOSCOPY  2008   nesxt is 08-07-18   ESOPHAGEAL DILATION     x2   LAPAROSCOPIC GASTRIC SLEEVE RESECTION N/A 08/14/2018   Procedure: LAPAROSCOPIC GASTRIC SLEEVE RESECTION, UPPER ENDO, ERAS PATHWAY;  Surgeon: Gladis Cough, MD;  Location: [facility];  Service: General;  Laterality: N/A;   OOPHORECTOMY     right, due to endometriosis   UPPER GASTROINTESTINAL ENDOSCOPY     Patient Active Problem List    Diagnosis Date Noted   Need for 23-polyvalent pneumococcal polysaccharide vaccine 08/24/2018   Dehydration 08/21/2018   S/P laparoscopic sleeve gastrectomy Dec 2019 08/14/2018   Peripheral neuropathy 07/14/2018   'Light-for-dates' infant with signs of fetal malnutrition 03/24/2018   Immunity status testing 04/01/2017   Severe obesity (BMI >= 40) (HCC) 10/22/2016   Lumbar radiculopathy, chronic 05/06/2016   CKD stage 2 due to type 2 diabetes mellitus (HCC) 09/04/2014   Health care maintenance 10/05/2013   Osteoarthritis of both knees 10/05/2013   Hyperlipidemia 12/25/2012   Anxiety and depression 12/28/2011   MIGRAINE HEADACHE 01/13/2008   GERD 01/13/2008   Essential hypertension 02/09/2007   Diabetes mellitus type 2 with retinopathy (HCC) 08/31/2004    PCP: Vernadine Charlie ORN, MD  REFERRING PROVIDER: Debby Dorn MATSU, MD  REFERRING DIAG: M54.16 (ICD-10-CM) - Radiculopathy, lumbar region  Rationale for Evaluation and Treatment: Rehabilitation  THERAPY DIAG:  Other low back pain  Muscle weakness (generalized)  ONSET DATE: 15+ years, worsening over 3-4 months w/ new pains  SUBJECTIVE:  Per eval: Pt reports chronic history of back pain 15+ years. States she is having a new pain R side low back into hip, down into posterior thigh and foot. States she would occasionally get paresthesias in leg historically but not all the way down to leg. Pt reports weight loss surgery prior to 2020, was more active and felt that helped. Feels this episode was preceded by reducing her activities, was previously in gym about 3 days a week. Feels it is gradually worsening since onset although she had an injection last week with excellent relief.  No L sided symptoms. No bowel/bladder symptoms, no saddle anesthesia. No  fevers/chills.  SUBJECTIVE STATEMENT: Pt states her R knee is hurting her a lot today causing difficulty with bending her knee. Pt reports the R side feels aggravated by the exercises -- has not been able to do her HEP because of it. Has not gone away after 2-3 days. Back to sleepless nights. Likes to sleep on her right side so feels throbbing. 7/10 with standing and moving. 0 at rest. Not getting shooting pain down to her foot though.   PERTINENT HISTORY:  anxiety/depression, DM2. GERD, headache, HTN  PAIN:  Are you having pain: none at rest, 4/10 with standing/walking  Per eval; Location/description: R sided low back down to foot Best-worst over past week: 0-6/10 - aggravating factors: transfers, lifting leg, getting on and off floor, sitting for prolonged periods, lifting, standing >10 min - Easing factors: injection, ice, heat, icy hot, pain patches    PRECAUTIONS: None  WEIGHT BEARING RESTRICTIONS: No  FALLS:  Has patient fallen in last 6 months? No  LIVING ENVIRONMENT: 1 level apt, lives w/ husband and son Pt states she does majority of housework Requires rest breaks, sitting No AD  OCCUPATION: retired, work part time as Midwife and helps client   PLOF: Independent  PATIENT GOALS: go back to gym, walk further  NEXT MD VISIT: unsure   OBJECTIVE:  Note: Objective measures were completed at Evaluation unless otherwise noted.  DIAGNOSTIC FINDINGS:  MRI 02/04/24 lumbar: IMPRESSION: 1. Progressive broad-based disc protrusion at L4-L5 with bilateral facet arthropathy resulting in mild subarticular narrowing (right greater than left) and bilateral foraminal stenosis. 2. Leftward disc protrusion at L5-S1 with mild facet arthropathy, mild subarticular narrowing (worse on the left), and progressive severe left and moderate right foraminal stenosis. 3. Grade 1 anterolisthesis at L4-5, slightly progressed.  PATIENT SURVEYS:  ODI: 18/50; 36%   COGNITION: Overall  cognitive status: Within functional limits for tasks assessed     SENSATION: LT intact BIL LE   POSTURE: lateral shift towards L    LUMBAR ROM:   AROM eval  Flexion Pressure at proximal shin, able to push to distal shin  Extension 100% but reproduces RLE pain  Right lateral flexion Above knee  Left lateral flexion Above knee  Right rotation 50% s  Left rotation 50% * no LE pain   (Blank rows = not tested) (Key: WFL = within functional limits not formally assessed, * = concordant pain, s = stiffness/stretching sensation, NT = not tested) Comment:    LOWER EXTREMITY MMT:    MMT Right eval Left eval  Hip flexion 4- 4+  Hip abduction (modified sitting) 5 5  Hip internal rotation 4 4+  Hip external rotation 4 4+  Knee flexion 4+ 5  Knee extension 5 5  Ankle dorsiflexion 5 5   (Blank rows = not tested) (Key: WFL = within functional limits not formally  assessed, * = concordant pain, s = stiffness/stretching sensation, NT = not tested)  Comments:    LUMBAR SPECIAL TESTS:  Slump + RLE, - LLE  FUNCTIONAL TESTS:  5xSTS: 13.39sec w UE support, 13.34sec w/o UE support  GAIT: Distance walked: within clinic Assistive device utilized: None Level of assistance: Complete Independence Comments: mildly reduced gait speed/cadence  TREATMENT DATE:  [facility] Adult PT Treatment:                                                DATE: 04/23/24 Therapeutic Exercise: Nustep level 5 x 6 mins while gathering subjective and planning session with patient Supine LTR x10 Bent knee fall outs x10 Supine figure 4 stretch x 30 ITB stretch with strap 2x30  S/L clamshell 2x10 Neuromuscular re-ed: PPT + TSA contraction 2x10 Manual Therapy IASTM massage roller and gentle STM R adductors and R abductors/glutes Self Care Self massage with massage roller vs tennis ball  [facility] Adult PT Treatment:                                                DATE: 04/18/24 Therapeutic Exercise: Nustep level 5 x 8  mins while gathering subjective and planning session with patient Seated pball roll outs fwd/lat x10 ea Neuromuscular re-ed: Hip add iso 2x10 cues for pacing and core contraction, breath control Sciatic nerve glides 2x10 STS + foam roll push down for core activation 2x8 cues for core contraction and pacing Red band paloff x8 BIL  Therapeutic Activity: STS x8 cues for mechanics 10# suitcase carry 2x8ft BIL cues for posture  [facility] Adult PT Treatment:                                                DATE: 04/16/24  Neuromuscular re-ed: Hip add iso 2x10 cues for pacing and core contraction, breath control Sciatic nerve glides x10 STS + foam roll push down for core activation x8 cues for core contraction and pacing Red band paloff x8 BIL   Therapeutic Activity: STS x8 cues for mechanics 10# suitcase carry 2x64ft BIL cues for posture                                                                                                                         PATIENT EDUCATION:  Education details: rationale for interventions, HEP  Person educated: Patient Education method: Explanation, Demonstration, Tactile cues, Verbal cues Education comprehension: verbalized understanding, returned demonstration, verbal cues required, tactile cues required, and needs further education     HOME EXERCISE PROGRAM: Access Code: [email or web address] URL: [email or web address] Date:  04/23/2024 Prepared by: Graviela Nodal April Earnie Starring  Exercises - Supine Lower Trunk Rotation  - 1 x daily - 7 x weekly - 1 sets - 10 reps - Supine ITB Stretch with Strap  - 1 x daily - 7 x weekly - 2 sets - 30 sec hold - Supine Posterior Pelvic Tilt  - 1 x daily - 7 x weekly - 2 sets - 10 reps - 3-5 sec hold - Bent Knee Fallouts  - 1 x daily - 7 x weekly - 1 sets - 10 reps - Clamshell  - 1 x daily - 7 x weekly - 2 sets - 10 reps - Seated Sciatic Tensioner  - 2-3 x daily - 1 sets - 8-10 reps  ASSESSMENT:  CLINICAL  IMPRESSION: Pt with increased R medial knee pain -- found trigger points along adductors. Addressed with gentle manual work and stretching which improved pt pain. Pt notes no pain in her knee by end of session. Provided manual work to address her R glute med trigger points and pain as well with pt tolerating pressure on her R hip by end of session (has not been able to tolerate laying on her R side affecting her sleep because of too much tenderness). Educated pt on self massage and heat at home to continue to address pain. Updated pt's HEP to focus on abductor and core strengthening.   Per eval: Patient is a pleasant 63 y.o. woman who was seen today for physical therapy evaluation and treatment for chronic back pain with exacerbation over past few months, now having distal RLE referral. States this is limiting her participation in household tasks, recreational activities, and community navigation. No red flags today. On exam she demonstrates concordant limitations in lumbar ROM with extension reproducing RLE symptoms. Also demonstrates positive slump test RLE, mild RLE weakness, limitation in 5xSTS which is around cutoff score for fall risk (generally 12-14 sec). No adverse events, tolerates exam/HEP well overall. Recommend trial of skilled PT to address aforementioned deficits with aim of improving functional tolerance and reducing pain with typical activities. Pt departs today's session in no acute distress, all voiced concerns/questions addressed appropriately from PT perspective.      OBJECTIVE IMPAIRMENTS: Abnormal gait, decreased activity tolerance, decreased endurance, decreased mobility, difficulty walking, decreased ROM, decreased strength, impaired perceived functional ability, improper body mechanics, postural dysfunction, and pain.     GOALS:   SHORT TERM GOALS: Target date: 04/30/2024  Pt will demonstrate appropriate understanding and performance of initially prescribed HEP in order to  facilitate improved independence with management of symptoms.  Baseline: HEP established  Goal status: INITIAL   2. Pt will report at least 25% improvement in overall pain levels over past week in order to facilitate improved tolerance to typical daily activities.   Baseline: 0-6/10  Goal status: INITIAL    LONG TERM GOALS: Target date: 05/28/2024   Pt will improve at least 20% on ODI in order to demonstrate improved perception of functional status due to symptoms.  Baseline: 36% Goal status: INITIAL  2.  Pt will demonstrate full lumbar extension AROM without RLE symptoms in order to demonstrate improved tolerance to functional movement patterns.  Baseline: full w/ RLE symptoms Goal status: INITIAL  3.  Pt will demonstrate symmetrical hip/knee MMT of at least 4+/5  in order to demonstrate improved strength for functional movements.  Baseline: see MMT chart above Goal status: INITIAL  4. Pt will perform 5xSTS in </=10 sec in order to demonstrate reduced fall  risk and improved functional independence. (MCID of 2.3sec)  Baseline: 13sec w/ and w/o UE support  Goal status: INITIAL   5. Pt will report at least 50% decrease in overall pain levels in past week in order to facilitate improved tolerance to basic ADLs/mobility.   Baseline: 0-6/10  Goal status: INITIAL    6. Pt will endorse ability to stand for up to 30 min w/ less than 3pt increase in pain on NPS in order to improve tolerance to household tasks.   Baseline: <40min  Goal status: INITIAL  PLAN:  PT FREQUENCY: 2x/week  PT DURATION: 8 weeks  PLANNED INTERVENTIONS: 97164- PT Re-evaluation, 97750- Physical Performance Testing, 97110-Therapeutic exercises, 97530- Therapeutic activity, 97112- Neuromuscular re-education, 97535- Self Care, [id number]- Manual therapy, [phone]- Gait training, [phone]- Aquatic Therapy, [phone]- Electrical stimulation (unattended), [phone] (1-2 muscles), [phone] (3+ muscles)- Dry Needling, Patient/Family education,  Balance training, Stair training, Taping, Joint mobilization, Spinal mobilization, Cryotherapy, and Moist heat.  PLAN FOR NEXT SESSION: STGs coming up 04/30/24. Review/update HEP PRN. Work on [employer] exercises as appropriate with emphasis on core stability, comfortable lumbar/hip mobility, nerve desensitization, LE strengthening. Symptom modification strategies as indicated/appropriate.    Munirah Doerner April Ma L Javarie Crisp PT, DPT 04/23/2024 10:21 AM

## 2024-04-25 ENCOUNTER — Encounter: Payer: Self-pay | Admitting: Physical Therapy

## 2024-04-25 ENCOUNTER — Ambulatory Visit: Admitting: Physical Therapy

## 2024-04-25 DIAGNOSIS — M5459 Other low back pain: Secondary | ICD-10-CM

## 2024-04-25 DIAGNOSIS — M6281 Muscle weakness (generalized): Secondary | ICD-10-CM

## 2024-04-25 NOTE — Therapy (Signed)
 OUTPATIENT PHYSICAL THERAPY TREATMENT   Patient Name: JENNESS STEMLER MRN: [id number] DOB:[birth year], 63 y.o., female Today's Date: 04/25/2024  END OF SESSION:  PT End of Session - 04/25/24 0931     Visit Number 5    Number of Visits 17    Date for PT Re-Evaluation 05/28/24    Authorization Type [employer]    Authorization Time Period 6 visits 04/02/24-05/31/24    Authorization - Visit Number 5    Authorization - Number of Visits 6    PT Start Time 0932    PT Stop Time 1010    PT Time Calculation (min) 38 min    Activity Tolerance Patient tolerated treatment well    Behavior During Therapy WFL for tasks assessed/performed            Past Medical History:  Diagnosis Date   Allergy    Anal fissure    Anemia    Anxiety    Arthritis    Bradycardia    slow heart rate per pt - unsure of rate    Congenital stricture of esophagus    Depression    Diabetes mellitus type II    DIABETES MELLITUS, TYPE II, UNCONTROLLED 09/01/2007   04/10/13 eye exam neg proliferative DM retinopathy, +non proliferative DM retinopathy OS and OD +amblyopia OD    Dysmetabolic syndrome    Gall bladder disease    removed   GERD (gastroesophageal reflux disease)    Headache(784.0)    Heart murmur    Hemorrhoids    Hypertension    Neuromuscular disorder (HCC)    ruptured disc in back, burcitis    Sarcoid    Sleep apnea    no cpap    Thyroid disease    no meds needed at this time    Past Surgical History:  Procedure Laterality Date   ABDOMINAL HYSTERECTOMY     CHOLECYSTECTOMY     COLONOSCOPY  2008   nesxt is 08-07-18   ESOPHAGEAL DILATION     x2   LAPAROSCOPIC GASTRIC SLEEVE RESECTION N/A 08/14/2018   Procedure: LAPAROSCOPIC GASTRIC SLEEVE RESECTION, UPPER ENDO, ERAS PATHWAY;  Surgeon: Gladis Cough, MD;  Location: [facility];  Service: General;  Laterality: N/A;   OOPHORECTOMY     right, due to endometriosis   UPPER GASTROINTESTINAL ENDOSCOPY     Patient Active Problem List    Diagnosis Date Noted   Need for 23-polyvalent pneumococcal polysaccharide vaccine 08/24/2018   Dehydration 08/21/2018   S/P laparoscopic sleeve gastrectomy Dec 2019 08/14/2018   Peripheral neuropathy 07/14/2018   'Light-for-dates' infant with signs of fetal malnutrition 03/24/2018   Immunity status testing 04/01/2017   Severe obesity (BMI >= 40) (HCC) 10/22/2016   Lumbar radiculopathy, chronic 05/06/2016   CKD stage 2 due to type 2 diabetes mellitus (HCC) 09/04/2014   Health care maintenance 10/05/2013   Osteoarthritis of both knees 10/05/2013   Hyperlipidemia 12/25/2012   Anxiety and depression 12/28/2011   MIGRAINE HEADACHE 01/13/2008   GERD 01/13/2008   Essential hypertension 02/09/2007   Diabetes mellitus type 2 with retinopathy (HCC) 08/31/2004    PCP: Vernadine Charlie ORN, MD  REFERRING PROVIDER: Debby Dorn MATSU, MD  REFERRING DIAG: M54.16 (ICD-10-CM) - Radiculopathy, lumbar region  Rationale for Evaluation and Treatment: Rehabilitation  THERAPY DIAG:  Other low back pain  Muscle weakness (generalized)  ONSET DATE: 15+ years, worsening over 3-4 months w/ new pains  SUBJECTIVE:  Per eval: Pt reports chronic history of back pain 15+ years. States she is having a new pain R side low back into hip, down into posterior thigh and foot. States she would occasionally get paresthesias in leg historically but not all the way down to leg. Pt reports weight loss surgery prior to 2020, was more active and felt that helped. Feels this episode was preceded by reducing her activities, was previously in gym about 3 days a week. Feels it is gradually worsening since onset although she had an injection last week with excellent relief.  No L sided symptoms. No bowel/bladder symptoms, no saddle anesthesia. No  fevers/chills.  SUBJECTIVE STATEMENT: Pt reports 10/10 back pain this morning. Put the pad on and it eased up. Still hurts with sitting still and first walking and getting up.  PERTINENT HISTORY:  anxiety/depression, DM2. GERD, headache, HTN  PAIN:  Are you having pain: none at rest, 3/10 with standing/walking  Per eval; Location/description: R sided low back down to foot Best-worst over past week: 0-6/10 - aggravating factors: transfers, lifting leg, getting on and off floor, sitting for prolonged periods, lifting, standing >10 min - Easing factors: injection, ice, heat, icy hot, pain patches    PRECAUTIONS: None  WEIGHT BEARING RESTRICTIONS: No  FALLS:  Has patient fallen in last 6 months? No  LIVING ENVIRONMENT: 1 level apt, lives w/ husband and son Pt states she does majority of housework Requires rest breaks, sitting No AD  OCCUPATION: retired, work part time as Midwife and helps client   PLOF: Independent  PATIENT GOALS: go back to gym, walk further  NEXT MD VISIT: unsure   OBJECTIVE:  Note: Objective measures were completed at Evaluation unless otherwise noted.  DIAGNOSTIC FINDINGS:  MRI 02/04/24 lumbar: IMPRESSION: 1. Progressive broad-based disc protrusion at L4-L5 with bilateral facet arthropathy resulting in mild subarticular narrowing (right greater than left) and bilateral foraminal stenosis. 2. Leftward disc protrusion at L5-S1 with mild facet arthropathy, mild subarticular narrowing (worse on the left), and progressive severe left and moderate right foraminal stenosis. 3. Grade 1 anterolisthesis at L4-5, slightly progressed.  PATIENT SURVEYS:  ODI: 18/50; 36%   COGNITION: Overall cognitive status: Within functional limits for tasks assessed     SENSATION: LT intact BIL LE   POSTURE: lateral shift towards L    LUMBAR ROM:   AROM eval  Flexion Pressure at proximal shin, able to push to distal shin  Extension 100% but reproduces  RLE pain  Right lateral flexion Above knee  Left lateral flexion Above knee  Right rotation 50% s  Left rotation 50% * no LE pain   (Blank rows = not tested) (Key: WFL = within functional limits not formally assessed, * = concordant pain, s = stiffness/stretching sensation, NT = not tested) Comment:    LOWER EXTREMITY MMT:    MMT Right eval Left eval  Hip flexion 4- 4+  Hip abduction (modified sitting) 5 5  Hip internal rotation 4 4+  Hip external rotation 4 4+  Knee flexion 4+ 5  Knee extension 5 5  Ankle dorsiflexion 5 5   (Blank rows = not tested) (Key: WFL = within functional limits not formally assessed, * = concordant pain, s = stiffness/stretching sensation, NT = not tested)  Comments:    LUMBAR SPECIAL TESTS:  Slump + RLE, - LLE  FUNCTIONAL TESTS:  5xSTS: 13.39sec w UE support, 13.34sec w/o UE support  GAIT: Distance walked: within clinic Assistive device utilized: None Level of  assistance: Complete Independence Comments: mildly reduced gait speed/cadence  TREATMENT DATE:  [facility] Adult PT Treatment:                                                DATE: 04/25/24 Therapeutic Exercise: Nustep level 5 x 6 mins while gathering subjective and planning session with patient  Seated hamstring stretch x 30 Seated figure 4 stretch x 30 Seated piriformis stretch x 30 S/L clamshell 2x10 LTR 2x30 Neuromuscular re-ed: Bridge with red TB around knees 2x10 PPT + TSA contraction x10 PPT + marching red TB 2x5 Manual Therapy IASTM massage roller and gentle STM R adductors and R abductors/glutes    [facility] Adult PT Treatment:                                                DATE: 04/23/24 Therapeutic Exercise: Nustep level 5 x 6 mins while gathering subjective and planning session with patient Supine LTR x10 Bent knee fall outs x10 Supine figure 4 stretch x 30 ITB stretch with strap 2x30  S/L clamshell 2x10 Neuromuscular re-ed: PPT + TSA contraction 2x10 Manual  Therapy IASTM massage roller and gentle STM R adductors and R abductors/glutes Self Care Self massage with massage roller vs tennis ball  [facility] Adult PT Treatment:                                                DATE: 04/18/24 Therapeutic Exercise: Nustep level 5 x 8 mins while gathering subjective and planning session with patient Seated pball roll outs fwd/lat x10 ea Neuromuscular re-ed: Hip add iso 2x10 cues for pacing and core contraction, breath control Sciatic nerve glides 2x10 STS + foam roll push down for core activation 2x8 cues for core contraction and pacing Red band paloff x8 BIL  Therapeutic Activity: STS x8 cues for mechanics 10# suitcase carry 2x56ft BIL cues for posture  [facility] Adult PT Treatment:                                                DATE: 04/16/24  Neuromuscular re-ed: Hip add iso 2x10 cues for pacing and core contraction, breath control Sciatic nerve glides x10 STS + foam roll push down for core activation x8 cues for core contraction and pacing Red band paloff x8 BIL   Therapeutic Activity: STS x8 cues for mechanics 10# suitcase carry 2x87ft BIL cues for posture  PATIENT EDUCATION:  Education details: rationale for interventions, HEP  Person educated: Patient Education method: Explanation, Demonstration, Tactile cues, Verbal cues Education comprehension: verbalized understanding, returned demonstration, verbal cues required, tactile cues required, and needs further education     HOME EXERCISE PROGRAM: Access Code: [email or web address] URL: [email or web address] Date: 04/25/2024 Prepared by: Teresa Lemmerman April Earnie Starring  Exercises - Supine Lower Trunk Rotation  - 1 x daily - 7 x weekly - 1 sets - 10 reps - Supine ITB Stretch with Strap  - 1 x daily - 7 x weekly - 2 sets - 30 sec hold - Supine Posterior Pelvic Tilt  - 1 x daily - 7 x  weekly - 2 sets - 10 reps - 3-5 sec hold - Bent Knee Fallouts  - 1 x daily - 7 x weekly - 1 sets - 10 reps - Seated Sciatic Tensioner  - 2-3 x daily - 1 sets - 8-10 reps - Seated Hamstring Stretch  - 1 x daily - 7 x weekly - 2 sets - 30 sec hold - Seated Figure 4 Piriformis Stretch  - 1 x daily - 7 x weekly - 2 sets - 30 sec hold - Seated Piriformis Stretch  - 1 x daily - 7 x weekly - 2 sets - 30 sec hold - Clamshell with Resistance  - 1 x daily - 7 x weekly - 2 sets - 10 reps  ASSESSMENT:  CLINICAL IMPRESSION: No knee pain today. Able to concentrate on improving glute med and QL spasm/tightness. Provided seated stretches to address pt's stiffness with prolonged sitting. Able to progress core and hip strengthening this session with red TB. Some increased muscle spasm and pain with initial standing after supine exercises -- attempted standing stretches but did not help. Pain improved and settled back down with seated stretches.   Per eval: Patient is a pleasant 63 y.o. woman who was seen today for physical therapy evaluation and treatment for chronic back pain with exacerbation over past few months, now having distal RLE referral. States this is limiting her participation in household tasks, recreational activities, and community navigation. No red flags today. On exam she demonstrates concordant limitations in lumbar ROM with extension reproducing RLE symptoms. Also demonstrates positive slump test RLE, mild RLE weakness, limitation in 5xSTS which is around cutoff score for fall risk (generally 12-14 sec). No adverse events, tolerates exam/HEP well overall. Recommend trial of skilled PT to address aforementioned deficits with aim of improving functional tolerance and reducing pain with typical activities. Pt departs today's session in no acute distress, all voiced concerns/questions addressed appropriately from PT perspective.      OBJECTIVE IMPAIRMENTS: Abnormal gait, decreased activity tolerance,  decreased endurance, decreased mobility, difficulty walking, decreased ROM, decreased strength, impaired perceived functional ability, improper body mechanics, postural dysfunction, and pain.     GOALS:   SHORT TERM GOALS: Target date: 04/30/2024  Pt will demonstrate appropriate understanding and performance of initially prescribed HEP in order to facilitate improved independence with management of symptoms.  Baseline: HEP established  Goal status: INITIAL   2. Pt will report at least 25% improvement in overall pain levels over past week in order to facilitate improved tolerance to typical daily activities.   Baseline: 0-6/10  Goal status: INITIAL    LONG TERM GOALS: Target date: 05/28/2024   Pt will improve at least 20% on ODI in order to demonstrate improved perception of functional status due to symptoms.  Baseline: 36% Goal status: INITIAL  2.  Pt will  demonstrate full lumbar extension AROM without RLE symptoms in order to demonstrate improved tolerance to functional movement patterns.  Baseline: full w/ RLE symptoms Goal status: INITIAL  3.  Pt will demonstrate symmetrical hip/knee MMT of at least 4+/5  in order to demonstrate improved strength for functional movements.  Baseline: see MMT chart above Goal status: INITIAL  4. Pt will perform 5xSTS in </=10 sec in order to demonstrate reduced fall risk and improved functional independence. (MCID of 2.3sec)  Baseline: 13sec w/ and w/o UE support  Goal status: INITIAL   5. Pt will report at least 50% decrease in overall pain levels in past week in order to facilitate improved tolerance to basic ADLs/mobility.   Baseline: 0-6/10  Goal status: INITIAL    6. Pt will endorse ability to stand for up to 30 min w/ less than 3pt increase in pain on NPS in order to improve tolerance to household tasks.   Baseline: <4min  Goal status: INITIAL  PLAN:  PT FREQUENCY: 2x/week  PT DURATION: 8 weeks  PLANNED INTERVENTIONS: [id number]- PT  Re-evaluation, 97750- Physical Performance Testing, 97110-Therapeutic exercises, 97530- Therapeutic activity, 97112- Neuromuscular re-education, 97535- Self Care, [id number]- Manual therapy, [phone]- Gait training, [phone]- Aquatic Therapy, [phone]- Electrical stimulation (unattended), [phone] (1-2 muscles), [phone] (3+ muscles)- Dry Needling, Patient/Family education, Balance training, Stair training, Taping, Joint mobilization, Spinal mobilization, Cryotherapy, and Moist heat.  PLAN FOR NEXT SESSION: STGs coming up 04/30/24. Review/update HEP PRN. Work on [employer] exercises as appropriate with emphasis on core stability, comfortable lumbar/hip mobility, nerve desensitization, LE strengthening. Symptom modification strategies as indicated/appropriate.    Calynn Ferrero April Ma L Emeline Simpson PT, DPT 04/25/2024 9:32 AM

## 2024-04-26 DIAGNOSIS — G4733 Obstructive sleep apnea (adult) (pediatric): Secondary | ICD-10-CM | POA: Diagnosis not present

## 2024-04-30 ENCOUNTER — Ambulatory Visit: Admitting: Physical Therapy

## 2024-05-02 ENCOUNTER — Ambulatory Visit: Admitting: Physical Therapy

## 2024-05-02 ENCOUNTER — Encounter: Payer: Self-pay | Admitting: Physical Therapy

## 2024-05-02 DIAGNOSIS — M6281 Muscle weakness (generalized): Secondary | ICD-10-CM | POA: Diagnosis not present

## 2024-05-02 DIAGNOSIS — M5459 Other low back pain: Secondary | ICD-10-CM

## 2024-05-02 NOTE — Therapy (Signed)
 OUTPATIENT PHYSICAL THERAPY TREATMENT   Patient Name: Sheila Ferguson MRN: [id number] DOB:[birth year], 63 y.o., female Today's Date: 05/02/2024  END OF SESSION:  PT End of Session - 05/02/24 0934     Visit Number 6    Number of Visits 17    Date for PT Re-Evaluation 05/28/24    Authorization Type [employer]    Authorization Time Period Auth request for more visits on 05/02/24    Authorization - Visit Number 6    Authorization - Number of Visits 6    PT Start Time [phone]    PT Stop Time 1015    PT Time Calculation (min) 41 min    Activity Tolerance Patient tolerated treatment well    Behavior During Therapy [facility] for tasks assessed/performed             Past Medical History:  Diagnosis Date   Allergy    Anal fissure    Anemia    Anxiety    Arthritis    Bradycardia    slow heart rate per pt - unsure of rate    Congenital stricture of esophagus    Depression    Diabetes mellitus type II    DIABETES MELLITUS, TYPE II, UNCONTROLLED 09/01/2007   04/10/13 eye exam neg proliferative DM retinopathy, +non proliferative DM retinopathy OS and OD +amblyopia OD    Dysmetabolic syndrome    Gall bladder disease    removed   GERD (gastroesophageal reflux disease)    Headache(784.0)    Heart murmur    Hemorrhoids    Hypertension    Neuromuscular disorder (HCC)    ruptured disc in back, burcitis    Sarcoid    Sleep apnea    no cpap    Thyroid disease    no meds needed at this time    Past Surgical History:  Procedure Laterality Date   ABDOMINAL HYSTERECTOMY     CHOLECYSTECTOMY     COLONOSCOPY  2008   nesxt is 08-07-18   ESOPHAGEAL DILATION     x2   LAPAROSCOPIC GASTRIC SLEEVE RESECTION N/A 08/14/2018   Procedure: LAPAROSCOPIC GASTRIC SLEEVE RESECTION, UPPER ENDO, ERAS PATHWAY;  Surgeon: Gladis Cough, MD;  Location: [facility];  Service: General;  Laterality: N/A;   OOPHORECTOMY     right, due to endometriosis   UPPER GASTROINTESTINAL ENDOSCOPY     Patient Active  Problem List   Diagnosis Date Noted   Need for 23-polyvalent pneumococcal polysaccharide vaccine 08/24/2018   Dehydration 08/21/2018   S/P laparoscopic sleeve gastrectomy Dec 2019 08/14/2018   Peripheral neuropathy 07/14/2018   'Light-for-dates' infant with signs of fetal malnutrition 03/24/2018   Immunity status testing 04/01/2017   Severe obesity (BMI >= 40) (HCC) 10/22/2016   Lumbar radiculopathy, chronic 05/06/2016   CKD stage 2 due to type 2 diabetes mellitus (HCC) 09/04/2014   Health care maintenance 10/05/2013   Osteoarthritis of both knees 10/05/2013   Hyperlipidemia 12/25/2012   Anxiety and depression 12/28/2011   MIGRAINE HEADACHE 01/13/2008   GERD 01/13/2008   Essential hypertension 02/09/2007   Diabetes mellitus type 2 with retinopathy (HCC) 08/31/2004    PCP: Vernadine Charlie ORN, MD  REFERRING PROVIDER: Debby Dorn MATSU, MD  REFERRING DIAG: M54.16 (ICD-10-CM) - Radiculopathy, lumbar region  Rationale for Evaluation and Treatment: Rehabilitation  THERAPY DIAG:  Other low back pain  Muscle weakness (generalized)  ONSET DATE: 15+ years, worsening over 3-4 months w/ new pains  SUBJECTIVE:  Per eval: Pt reports chronic history of back pain 15+ years. States she is having a new pain R side low back into hip, down into posterior thigh and foot. States she would occasionally get paresthesias in leg historically but not all the way down to leg. Pt reports weight loss surgery prior to 2020, was more active and felt that helped. Feels this episode was preceded by reducing her activities, was previously in gym about 3 days a week. Feels it is gradually worsening since onset although she had an injection last week with excellent relief.  No L sided symptoms. No bowel/bladder symptoms, no saddle  anesthesia. No fevers/chills.  SUBJECTIVE STATEMENT: Pt states she's feeling pretty good this morning. Was sore for multiple days after last session. Did the stretches daily and throughout her work day. Pain is very minimal today.   PERTINENT HISTORY:  anxiety/depression, DM2. GERD, headache, HTN  PAIN:  Are you having pain: none at rest, 0/10 with standing/walking  Per eval; Location/description: R sided low back down to foot Best-worst over past week: 0-6/10 - aggravating factors: transfers, lifting leg, getting on and off floor, sitting for prolonged periods, lifting, standing >10 min - Easing factors: injection, ice, heat, icy hot, pain patches    PRECAUTIONS: None  WEIGHT BEARING RESTRICTIONS: No  FALLS:  Has patient fallen in last 6 months? No  LIVING ENVIRONMENT: 1 level apt, lives w/ husband and son Pt states she does majority of housework Requires rest breaks, sitting No AD  OCCUPATION: retired, work part time as Midwife and helps client   PLOF: Independent  PATIENT GOALS: go back to gym, walk further  NEXT MD VISIT: unsure   OBJECTIVE:  Note: Objective measures were completed at Evaluation unless otherwise noted.  DIAGNOSTIC FINDINGS:  MRI 02/04/24 lumbar: IMPRESSION: 1. Progressive broad-based disc protrusion at L4-L5 with bilateral facet arthropathy resulting in mild subarticular narrowing (right greater than left) and bilateral foraminal stenosis. 2. Leftward disc protrusion at L5-S1 with mild facet arthropathy, mild subarticular narrowing (worse on the left), and progressive severe left and moderate right foraminal stenosis. 3. Grade 1 anterolisthesis at L4-5, slightly progressed.  PATIENT SURVEYS:  ODI: 18/50; 36% (eval) ODI: 32% (05/02/24)  COGNITION: Overall cognitive status: Within functional limits for tasks assessed     SENSATION: LT intact BIL LE  POSTURE: lateral shift towards L  LUMBAR ROM:   AROM eval  Flexion Pressure at  proximal shin, able to push to distal shin  Extension 100% but reproduces RLE pain  Right lateral flexion Above knee  Left lateral flexion Above knee  Right rotation 50% s  Left rotation 50% * no LE pain   (Blank rows = not tested) (Key: WFL = within functional limits not formally assessed, * = concordant pain, s = stiffness/stretching sensation, NT = not tested) Comment:    LOWER EXTREMITY MMT:    MMT Right eval Left eval  Hip flexion 4- 4+  Hip abduction (modified sitting) 5 5  Hip internal rotation 4 4+  Hip external rotation 4 4+  Knee flexion 4+ 5  Knee extension 5 5  Ankle dorsiflexion 5 5   (Blank rows = not tested) (Key: WFL = within functional limits not formally assessed, * = concordant pain, s = stiffness/stretching sensation, NT = not tested)  Comments:    LUMBAR SPECIAL TESTS:  Slump + RLE, - LLE  FUNCTIONAL TESTS:  5xSTS:  13.39sec w UE support, 13.34sec w/o UE support (eval) 12.54 sec w/o UE support (  05/02/24)  GAIT: Distance walked: within clinic Assistive device utilized: None Level of assistance: Complete Independence Comments: mildly reduced gait speed/cadence  TREATMENT DATE:  [facility] Adult PT Treatment:                                                DATE: 05/02/24 Therapeutic Exercise: Seated hamstring stretch x 30 R&L Seated figure 4 stretch x 30 R&L Seated piriformis stretch x 30 R&L Seated pball flexion 3 x10 each direction Neuromuscular re-ed: Seated pball press down ab set 10x5; oblique press downs 10x5 Standing hip abd 3x5 with focus on reducing QL activation Self Care: Rechecked goals POC and requesting insurance auth Adding more strengthening and supplementing with good nutrition   [facility] Adult PT Treatment:                                                DATE: 04/25/24 Therapeutic Exercise: Nustep level 5 x 6 mins while gathering subjective and planning session with patient  Seated hamstring stretch x 30 Seated figure 4 stretch  x 30 Seated piriformis stretch x 30 S/L clamshell 2x10 LTR 2x30 Neuromuscular re-ed: Bridge with red TB around knees 2x10 PPT + TSA contraction x10 PPT + marching red TB 2x5 Manual Therapy IASTM massage roller and gentle STM R adductors and R abductors/glutes    [facility] Adult PT Treatment:                                                DATE: 04/23/24 Therapeutic Exercise: Nustep level 5 x 6 mins while gathering subjective and planning session with patient Supine LTR x10 Bent knee fall outs x10 Supine figure 4 stretch x 30 ITB stretch with strap 2x30  S/L clamshell 2x10 Neuromuscular re-ed: PPT + TSA contraction 2x10 Manual Therapy IASTM massage roller and gentle STM R adductors and R abductors/glutes Self Care Self massage with massage roller vs tennis ball  [facility] Adult PT Treatment:                                                DATE: 04/18/24 Therapeutic Exercise: Nustep level 5 x 8 mins while gathering subjective and planning session with patient Seated pball roll outs fwd/lat x10 ea Neuromuscular re-ed: Hip add iso 2x10 cues for pacing and core contraction, breath control Sciatic nerve glides 2x10 STS + foam roll push down for core activation 2x8 cues for core contraction and pacing Red band paloff x8 BIL  Therapeutic Activity: STS x8 cues for mechanics 10# suitcase carry 2x63ft BIL cues for posture  PATIENT EDUCATION:  Education details: rationale for interventions, HEP  Person educated: Patient Education method: Explanation, Demonstration, Tactile cues, Verbal cues Education comprehension: verbalized understanding, returned demonstration, verbal cues required, tactile cues required, and needs further education     HOME EXERCISE PROGRAM: Access Code: [email or web address] URL: [email or web address] Date: 04/25/2024 Prepared by: Jalesia Loudenslager April  Earnie Starring  Exercises - Supine Lower Trunk Rotation  - 1 x daily - 7 x weekly - 1 sets - 10 reps - Supine ITB Stretch with Strap  - 1 x daily - 7 x weekly - 2 sets - 30 sec hold - Supine Posterior Pelvic Tilt  - 1 x daily - 7 x weekly - 2 sets - 10 reps - 3-5 sec hold - Bent Knee Fallouts  - 1 x daily - 7 x weekly - 1 sets - 10 reps - Seated Sciatic Tensioner  - 2-3 x daily - 1 sets - 8-10 reps - Seated Hamstring Stretch  - 1 x daily - 7 x weekly - 2 sets - 30 sec hold - Seated Figure 4 Piriformis Stretch  - 1 x daily - 7 x weekly - 2 sets - 30 sec hold - Seated Piriformis Stretch  - 1 x daily - 7 x weekly - 2 sets - 30 sec hold - Clamshell with Resistance  - 1 x daily - 7 x weekly - 2 sets - 10 reps  ASSESSMENT:  CLINICAL IMPRESSION: Pt comes in endorsing very little pain today. Has been compliant with her stretches but has not been as consistent with resistance band/strengthening exercises. Worked on improving body mechanics in standing to use her hips vs her back when trying to lift her R LE. Continued core work but in seated today and some hip strengthening. ODI has slightly improved. Pt's pain is reduced today but was up earlier this week. Pt will continue to benefit from PT to reach her LTGs and maintain her current progress.   Per eval: Patient is a pleasant 63 y.o. woman who was seen today for physical therapy evaluation and treatment for chronic back pain with exacerbation over past few months, now having distal RLE referral. States this is limiting her participation in household tasks, recreational activities, and community navigation. No red flags today. On exam she demonstrates concordant limitations in lumbar ROM with extension reproducing RLE symptoms. Also demonstrates positive slump test RLE, mild RLE weakness, limitation in 5xSTS which is around cutoff score for fall risk (generally 12-14 sec). No adverse events, tolerates exam/HEP well overall. Recommend trial of skilled PT to  address aforementioned deficits with aim of improving functional tolerance and reducing pain with typical activities. Pt departs today's session in no acute distress, all voiced concerns/questions addressed appropriately from PT perspective.      OBJECTIVE IMPAIRMENTS: Abnormal gait, decreased activity tolerance, decreased endurance, decreased mobility, difficulty walking, decreased ROM, decreased strength, impaired perceived functional ability, improper body mechanics, postural dysfunction, and pain.     GOALS:   SHORT TERM GOALS: Target date: 04/30/2024  Pt will demonstrate appropriate understanding and performance of initially prescribed HEP in order to facilitate improved independence with management of symptoms.  Baseline: HEP established  05/02/24: has been doing the stretches Goal status: MET  2. Pt will report at least 25% improvement in overall pain levels over past week in order to facilitate improved tolerance to typical daily activities.   Baseline: 0-6/10  05/02/24: 0/10 today but had some pain for this past week  Goal status: PARTIALLY MET  LONG  TERM GOALS: Target date: 05/28/2024   Pt will improve at least 20% on ODI in order to demonstrate improved perception of functional status due to symptoms.  Baseline: 36% 05/02/24: 32% Goal status: IN PROGRESS  2.  Pt will demonstrate full lumbar extension AROM without RLE symptoms in order to demonstrate improved tolerance to functional movement patterns.  Baseline: full w/ RLE symptoms Goal status: IN PROGRESS  3.  Pt will demonstrate symmetrical hip/knee MMT of at least 4+/5  in order to demonstrate improved strength for functional movements.  Baseline: see MMT chart above Goal status: IN PROGRESS  4. Pt will perform 5xSTS in </=10 sec in order to demonstrate reduced fall risk and improved functional independence. (MCID of 2.3sec)  Baseline: 13sec w/ and w/o UE support  05/02/24: 12 sec w/o UE support  Goal status: IN  PROGRESS  5. Pt will report at least 50% decrease in overall pain levels in past week in order to facilitate improved tolerance to basic ADLs/mobility.   Baseline: 0-6/10  Goal status: IN PROGRESS   6. Pt will endorse ability to stand for up to 30 min w/ less than 3pt increase in pain on NPS in order to improve tolerance to household tasks.   Baseline: <79min  Goal status: IN PROGRESS  PLAN:  PT FREQUENCY: 2x/week  PT DURATION: 8 weeks  PLANNED INTERVENTIONS: 97164- PT Re-evaluation, 97750- Physical Performance Testing, 97110-Therapeutic exercises, 97530- Therapeutic activity, 97112- Neuromuscular re-education, 97535- Self Care, [id number]- Manual therapy, [phone]- Gait training, [phone]- Aquatic Therapy, [phone]- Electrical stimulation (unattended), [phone] (1-2 muscles), [phone] (3+ muscles)- Dry Needling, Patient/Family education, Balance training, Stair training, Taping, Joint mobilization, Spinal mobilization, Cryotherapy, and Moist heat.  PLAN FOR NEXT SESSION: Review/update HEP PRN. Work on [employer] exercises as appropriate with emphasis on core stability, comfortable lumbar/hip mobility, nerve desensitization, LE strengthening. Symptom modification strategies as indicated/appropriate.    Joana Nolton April Ma L Analaya Hoey PT, DPT 05/02/2024 1:22 PM

## 2024-05-09 ENCOUNTER — Ambulatory Visit

## 2024-05-09 ENCOUNTER — Telehealth: Payer: Self-pay

## 2024-05-09 DIAGNOSIS — M6281 Muscle weakness (generalized): Secondary | ICD-10-CM | POA: Diagnosis not present

## 2024-05-09 DIAGNOSIS — E1142 Type 2 diabetes mellitus with diabetic polyneuropathy: Secondary | ICD-10-CM | POA: Diagnosis not present

## 2024-05-09 DIAGNOSIS — E7800 Pure hypercholesterolemia, unspecified: Secondary | ICD-10-CM | POA: Diagnosis not present

## 2024-05-09 DIAGNOSIS — G4733 Obstructive sleep apnea (adult) (pediatric): Secondary | ICD-10-CM | POA: Diagnosis not present

## 2024-05-09 DIAGNOSIS — I10 Essential (primary) hypertension: Secondary | ICD-10-CM | POA: Diagnosis not present

## 2024-05-09 DIAGNOSIS — D869 Sarcoidosis, unspecified: Secondary | ICD-10-CM | POA: Diagnosis not present

## 2024-05-09 DIAGNOSIS — M1990 Unspecified osteoarthritis, unspecified site: Secondary | ICD-10-CM | POA: Diagnosis not present

## 2024-05-09 DIAGNOSIS — M5459 Other low back pain: Secondary | ICD-10-CM

## 2024-05-09 DIAGNOSIS — Z23 Encounter for immunization: Secondary | ICD-10-CM | POA: Diagnosis not present

## 2024-05-09 DIAGNOSIS — E11319 Type 2 diabetes mellitus with unspecified diabetic retinopathy without macular edema: Secondary | ICD-10-CM | POA: Diagnosis not present

## 2024-05-09 DIAGNOSIS — K222 Esophageal obstruction: Secondary | ICD-10-CM | POA: Diagnosis not present

## 2024-05-09 DIAGNOSIS — Z9884 Bariatric surgery status: Secondary | ICD-10-CM | POA: Diagnosis not present

## 2024-05-09 DIAGNOSIS — Z794 Long term (current) use of insulin: Secondary | ICD-10-CM | POA: Diagnosis not present

## 2024-05-09 NOTE — Therapy (Signed)
 OUTPATIENT PHYSICAL THERAPY TREATMENT   Patient Name: Sheila Ferguson MRN: [id number] DOB:06-Dec-1960, 63 y.o., female Today's Date: 05/09/2024  END OF SESSION:       Past Medical History:  Diagnosis Date   Allergy    Anal fissure    Anemia    Anxiety    Arthritis    Bradycardia    slow heart rate per pt - unsure of rate    Congenital stricture of esophagus    Depression    Diabetes mellitus type II    DIABETES MELLITUS, TYPE II, UNCONTROLLED 09/01/2007   04/10/13 eye exam neg proliferative DM retinopathy, +non proliferative DM retinopathy OS and OD +amblyopia OD    Dysmetabolic syndrome    Gall bladder disease    removed   GERD (gastroesophageal reflux disease)    Headache(784.0)    Heart murmur    Hemorrhoids    Hypertension    Neuromuscular disorder (HCC)    ruptured disc in back, burcitis    Sarcoid    Sleep apnea    no cpap    Thyroid disease    no meds needed at this time    Past Surgical History:  Procedure Laterality Date   ABDOMINAL HYSTERECTOMY     CHOLECYSTECTOMY     COLONOSCOPY  2008   nesxt is 08-07-18   ESOPHAGEAL DILATION     x2   LAPAROSCOPIC GASTRIC SLEEVE RESECTION N/A 08/14/2018   Procedure: LAPAROSCOPIC GASTRIC SLEEVE RESECTION, UPPER ENDO, ERAS PATHWAY;  Surgeon: Gladis Cough, MD;  Location: [facility];  Service: General;  Laterality: N/A;   OOPHORECTOMY     right, due to endometriosis   UPPER GASTROINTESTINAL ENDOSCOPY     Patient Active Problem List   Diagnosis Date Noted   Need for 23-polyvalent pneumococcal polysaccharide vaccine 08/24/2018   Dehydration 08/21/2018   S/P laparoscopic sleeve gastrectomy Dec 2019 08/14/2018   Peripheral neuropathy 07/14/2018   'Light-for-dates' infant with signs of fetal malnutrition 03/24/2018   Immunity status testing 04/01/2017   Severe obesity (BMI >= 40) (HCC) 10/22/2016   Lumbar radiculopathy, chronic 05/06/2016   CKD stage 2 due to type 2 diabetes mellitus (HCC) 09/04/2014   Health care  maintenance 10/05/2013   Osteoarthritis of both knees 10/05/2013   Hyperlipidemia 12/25/2012   Anxiety and depression 12/28/2011   MIGRAINE HEADACHE 01/13/2008   GERD 01/13/2008   Essential hypertension 02/09/2007   Diabetes mellitus type 2 with retinopathy (HCC) 08/31/2004    PCP: Vernadine Charlie ORN, MD  REFERRING PROVIDER: Debby Dorn MATSU, MD  REFERRING DIAG: M54.16 (ICD-10-CM) - Radiculopathy, lumbar region  Rationale for Evaluation and Treatment: Rehabilitation  THERAPY DIAG:  No diagnosis found.  ONSET DATE: 15+ years, worsening over 3-4 months w/ new pains  SUBJECTIVE:  Per eval: Pt reports chronic history of back pain 15+ years. States she is having a new pain R side low back into hip, down into posterior thigh and foot. States she would occasionally get paresthesias in leg historically but not all the way down to leg. Pt reports weight loss surgery prior to 2020, was more active and felt that helped. Feels this episode was preceded by reducing her activities, was previously in gym about 3 days a week. Feels it is gradually worsening since onset although she had an injection last week with excellent relief.  No L sided symptoms. No bowel/bladder symptoms, no saddle anesthesia. No fevers/chills.  SUBJECTIVE STATEMENT: Patient reports that she has been doing better overall. However, she was walking a lot yesterday and has increased back pain today. She was   PERTINENT HISTORY:  anxiety/depression, DM2. GERD, headache, HTN  PAIN:  Are you having pain: none at rest, 0/10 with standing/walking  Per eval; Location/description: R sided low back down to foot Best-worst over past week: 0-6/10 - aggravating factors: transfers, lifting leg, getting on and off floor, sitting for prolonged periods,  lifting, standing >10 min - Easing factors: injection, ice, heat, icy hot, pain patches    PRECAUTIONS: None  WEIGHT BEARING RESTRICTIONS: No  FALLS:  Has patient fallen in last 6 months? No  LIVING ENVIRONMENT: 1 level apt, lives w/ husband and son Pt states she does majority of housework Requires rest breaks, sitting No AD  OCCUPATION: retired, work part time as Midwife and helps client   PLOF: Independent  PATIENT GOALS: go back to gym, walk further  NEXT MD VISIT: unsure   OBJECTIVE:  Note: Objective measures were completed at Evaluation unless otherwise noted.  DIAGNOSTIC FINDINGS:  MRI 02/04/24 lumbar: IMPRESSION: 1. Progressive broad-based disc protrusion at L4-L5 with bilateral facet arthropathy resulting in mild subarticular narrowing (right greater than left) and bilateral foraminal stenosis. 2. Leftward disc protrusion at L5-S1 with mild facet arthropathy, mild subarticular narrowing (worse on the left), and progressive severe left and moderate right foraminal stenosis. 3. Grade 1 anterolisthesis at L4-5, slightly progressed.  PATIENT SURVEYS:  ODI: 18/50; 36% (eval) ODI: 32% (05/02/24)  COGNITION: Overall cognitive status: Within functional limits for tasks assessed     SENSATION: LT intact BIL LE  POSTURE: lateral shift towards L  LUMBAR ROM:   AROM eval  Flexion Pressure at proximal shin, able to push to distal shin  Extension 100% but reproduces RLE pain  Right lateral flexion Above knee  Left lateral flexion Above knee  Right rotation 50% s  Left rotation 50% * no LE pain   (Blank rows = not tested) (Key: WFL = within functional limits not formally assessed, * = concordant pain, s = stiffness/stretching sensation, NT = not tested) Comment:    LOWER EXTREMITY MMT:    MMT Right eval Left eval  Hip flexion 4- 4+  Hip abduction (modified sitting) 5 5  Hip internal rotation 4 4+  Hip external rotation 4 4+  Knee flexion 4+ 5   Knee extension 5 5  Ankle dorsiflexion 5 5   (Blank rows = not tested) (Key: WFL = within functional limits not formally assessed, * = concordant pain, s = stiffness/stretching sensation, NT = not tested)  Comments:    LUMBAR SPECIAL TESTS:  Slump + RLE, - LLE  FUNCTIONAL TESTS:  5xSTS:  13.39sec w UE support, 13.34sec w/o UE support (eval) 12.54 sec w/o UE support (05/02/24)  GAIT: Distance walked: within  clinic Assistive device utilized: None Level of assistance: Complete Independence Comments: mildly reduced gait speed/cadence  TREATMENT DATE:   [facility] Adult PT Treatment:                                                DATE: 04/25/24 Therapeutic Exercise: Nustep level 5 x 6 mins while gathering subjective and planning session with patient  Seated hamstring stretch x 30 Seated figure 4 stretch x 30 Seated HS stretch x 30 S/L clamshell 2x10 LTR 2x30 Neuromuscular re-ed: Bridge with red TB around knees 2x10 PPT + TSA contraction x10 PPT + marching red TB 2x5 Manual Therapy IASTM massage roller and gentle STM R adductors and R abductors/glutes  [facility] Adult PT Treatment:                                                DATE: 05/02/24 Therapeutic Exercise: Seated hamstring stretch x 30 R&L Seated figure 4 stretch x 30 R&L Seated piriformis stretch x 30 R&L Seated pball flexion 3 x10 each direction Neuromuscular re-ed: Seated pball press down ab set 10x5; oblique press downs 10x5 Standing hip abd 3x5 with focus on reducing QL activation Self Care: Rechecked goals POC and requesting insurance auth Adding more strengthening and supplementing with good nutrition   [facility] Adult PT Treatment:                                                DATE: 04/25/24 Therapeutic Exercise: Nustep level 5 x 6 mins while gathering subjective and planning session with patient  Seated hamstring stretch x 30 Seated figure 4 stretch x 30 Seated piriformis stretch x 30 S/L clamshell  2x10 LTR 2x30 Neuromuscular re-ed: Bridge with red TB around knees 2x10 PPT + TSA contraction x10 PPT + marching red TB 2x5 Manual Therapy IASTM massage roller and gentle STM R adductors and R abductors/glutes    [facility] Adult PT Treatment:                                                DATE: 04/23/24 Therapeutic Exercise: Nustep level 5 x 6 mins while gathering subjective and planning session with patient Supine LTR x10 Bent knee fall outs x10 Supine figure 4 stretch x 30 ITB stretch with strap 2x30  S/L clamshell 2x10 Neuromuscular re-ed: PPT + TSA contraction 2x10 Manual Therapy IASTM massage roller and gentle STM R adductors and R abductors/glutes Self Care Self massage with massage roller vs tennis ball  [facility] Adult PT Treatment:                                                DATE: 04/18/24 Therapeutic Exercise: Nustep level 5 x 8 mins while gathering subjective and planning session with patient Seated pball roll outs fwd/lat x10 ea Neuromuscular re-ed: Hip add iso  2x10 cues for pacing and core contraction, breath control Sciatic nerve glides 2x10 STS + foam roll push down for core activation 2x8 cues for core contraction and pacing Red band paloff x8 BIL  Therapeutic Activity: STS x8 cues for mechanics 10# suitcase carry 2x35ft BIL cues for posture                                                                                                                          PATIENT EDUCATION:  Education details: rationale for interventions, HEP  Person educated: Patient Education method: Explanation, Demonstration, Tactile cues, Verbal cues Education comprehension: verbalized understanding, returned demonstration, verbal cues required, tactile cues required, and needs further education     HOME EXERCISE PROGRAM: Access Code: [email or web address] URL: [email or web address] Date: 04/25/2024 Prepared by: Gellen April Earnie Starring  Exercises - Supine Lower Trunk  Rotation  - 1 x daily - 7 x weekly - 1 sets - 10 reps - Supine ITB Stretch with Strap  - 1 x daily - 7 x weekly - 2 sets - 30 sec hold - Supine Posterior Pelvic Tilt  - 1 x daily - 7 x weekly - 2 sets - 10 reps - 3-5 sec hold - Bent Knee Fallouts  - 1 x daily - 7 x weekly - 1 sets - 10 reps - Seated Sciatic Tensioner  - 2-3 x daily - 1 sets - 8-10 reps - Seated Hamstring Stretch  - 1 x daily - 7 x weekly - 2 sets - 30 sec hold - Seated Figure 4 Piriformis Stretch  - 1 x daily - 7 x weekly - 2 sets - 30 sec hold - Seated Piriformis Stretch  - 1 x daily - 7 x weekly - 2 sets - 30 sec hold - Clamshell with Resistance  - 1 x daily - 7 x weekly - 2 sets - 10 reps  ASSESSMENT:  CLINICAL IMPRESSION: Pt comes in endorsing very little pain today. Has been compliant with her stretches but has not been as consistent with resistance band/strengthening exercises. Worked on improving body mechanics in standing to use her hips vs her back when trying to lift her R LE. Continued core work but in seated today and some hip strengthening. ODI has slightly improved. Pt's pain is reduced today but was up earlier this week. Pt will continue to benefit from PT to reach her LTGs and maintain her current progress.   Per eval: Patient is a pleasant 63 y.o. woman who was seen today for physical therapy evaluation and treatment for chronic back pain with exacerbation over past few months, now having distal RLE referral. States this is limiting her participation in household tasks, recreational activities, and community navigation. No red flags today. On exam she demonstrates concordant limitations in lumbar ROM with extension reproducing RLE symptoms. Also demonstrates positive slump test RLE, mild RLE weakness, limitation in 5xSTS which is around cutoff score for fall risk (generally 12-14 sec). No adverse events, tolerates exam/HEP  well overall. Recommend trial of skilled PT to address aforementioned deficits with aim of  improving functional tolerance and reducing pain with typical activities. Pt departs today's session in no acute distress, all voiced concerns/questions addressed appropriately from PT perspective.      OBJECTIVE IMPAIRMENTS: Abnormal gait, decreased activity tolerance, decreased endurance, decreased mobility, difficulty walking, decreased ROM, decreased strength, impaired perceived functional ability, improper body mechanics, postural dysfunction, and pain.     GOALS:   SHORT TERM GOALS: Target date: 04/30/2024  Pt will demonstrate appropriate understanding and performance of initially prescribed HEP in order to facilitate improved independence with management of symptoms.  Baseline: HEP established  05/02/24: has been doing the stretches Goal status: MET  2. Pt will report at least 25% improvement in overall pain levels over past week in order to facilitate improved tolerance to typical daily activities.   Baseline: 0-6/10  05/02/24: 0/10 today but had some pain for this past week  Goal status: PARTIALLY MET  LONG TERM GOALS: Target date: 05/28/2024   Pt will improve at least 20% on ODI in order to demonstrate improved perception of functional status due to symptoms.  Baseline: 36% 05/02/24: 32% Goal status: IN PROGRESS  2.  Pt will demonstrate full lumbar extension AROM without RLE symptoms in order to demonstrate improved tolerance to functional movement patterns.  Baseline: full w/ RLE symptoms Goal status: IN PROGRESS  3.  Pt will demonstrate symmetrical hip/knee MMT of at least 4+/5  in order to demonstrate improved strength for functional movements.  Baseline: see MMT chart above Goal status: IN PROGRESS  4. Pt will perform 5xSTS in </=10 sec in order to demonstrate reduced fall risk and improved functional independence. (MCID of 2.3sec)  Baseline: 13sec w/ and w/o UE support  05/02/24: 12 sec w/o UE support  Goal status: IN PROGRESS  5. Pt will report at least 50% decrease  in overall pain levels in past week in order to facilitate improved tolerance to basic ADLs/mobility.   Baseline: 0-6/10  Goal status: IN PROGRESS   6. Pt will endorse ability to stand for up to 30 min w/ less than 3pt increase in pain on NPS in order to improve tolerance to household tasks.   Baseline: <65min  Goal status: IN PROGRESS  PLAN:  PT FREQUENCY: 2x/week  PT DURATION: 8 weeks  PLANNED INTERVENTIONS: [id number]- PT Re-evaluation, 97750- Physical Performance Testing, 97110-Therapeutic exercises, 97530- Therapeutic activity, 97112- Neuromuscular re-education, 97535- Self Care, [id number]- Manual therapy, [phone]- Gait training, [phone]- Aquatic Therapy, [phone]- Electrical stimulation (unattended), [phone] (1-2 muscles), [phone] (3+ muscles)- Dry Needling, Patient/Family education, Balance training, Stair training, Taping, Joint mobilization, Spinal mobilization, Cryotherapy, and Moist heat.  PLAN FOR NEXT SESSION: Review/update HEP PRN. Work on [employer] exercises as appropriate with emphasis on core stability, comfortable lumbar/hip mobility, nerve desensitization, LE strengthening. Symptom modification strategies as indicated/appropriate.    Marko Molt PT, DPT 05/09/2024 11:32 AM

## 2024-05-10 NOTE — Telephone Encounter (Signed)
 Error//MJ

## 2024-05-16 ENCOUNTER — Ambulatory Visit: Attending: Neurosurgery

## 2024-05-16 DIAGNOSIS — M5459 Other low back pain: Secondary | ICD-10-CM | POA: Diagnosis present

## 2024-05-16 DIAGNOSIS — M6281 Muscle weakness (generalized): Secondary | ICD-10-CM | POA: Insufficient documentation

## 2024-05-16 NOTE — Therapy (Signed)
 OUTPATIENT PHYSICAL THERAPY TREATMENT   Patient Name: Sheila Ferguson MRN: [id number] DOB:03/27/1961, 63 y.o., female Today's Date: 05/16/2024  END OF SESSION:  PT End of Session - 05/16/24 1217     Visit Number 8    Number of Visits 17    Date for PT Re-Evaluation 05/28/24    Authorization Type [employer]    Authorization Time Period 05/02/24-05/31/24    Authorization - Visit Number 3    Authorization - Number of Visits 4    PT Start Time 1132    PT Stop Time 1215    PT Time Calculation (min) 43 min             Past Medical History:  Diagnosis Date   Allergy    Anal fissure    Anemia    Anxiety    Arthritis    Bradycardia    slow heart rate per pt - unsure of rate    Congenital stricture of esophagus    Depression    Diabetes mellitus type II    DIABETES MELLITUS, TYPE II, UNCONTROLLED 09/01/2007   04/10/13 eye exam neg proliferative DM retinopathy, +non proliferative DM retinopathy OS and OD +amblyopia OD    Dysmetabolic syndrome    Gall bladder disease    removed   GERD (gastroesophageal reflux disease)    Headache(784.0)    Heart murmur    Hemorrhoids    Hypertension    Neuromuscular disorder (HCC)    ruptured disc in back, burcitis    Sarcoid    Sleep apnea    no cpap    Thyroid disease    no meds needed at this time    Past Surgical History:  Procedure Laterality Date   ABDOMINAL HYSTERECTOMY     CHOLECYSTECTOMY     COLONOSCOPY  2008   nesxt is 08-07-18   ESOPHAGEAL DILATION     x2   LAPAROSCOPIC GASTRIC SLEEVE RESECTION N/A 08/14/2018   Procedure: LAPAROSCOPIC GASTRIC SLEEVE RESECTION, UPPER ENDO, ERAS PATHWAY;  Surgeon: Gladis Cough, MD;  Location: [facility];  Service: General;  Laterality: N/A;   OOPHORECTOMY     right, due to endometriosis   UPPER GASTROINTESTINAL ENDOSCOPY     Patient Active Problem List   Diagnosis Date Noted   Need for 23-polyvalent pneumococcal polysaccharide vaccine 08/24/2018   Dehydration 08/21/2018    S/P laparoscopic sleeve gastrectomy Dec 2019 08/14/2018   Peripheral neuropathy 07/14/2018   'Light-for-dates' infant with signs of fetal malnutrition 03/24/2018   Immunity status testing 04/01/2017   Severe obesity (BMI >= 40) (HCC) 10/22/2016   Lumbar radiculopathy, chronic 05/06/2016   CKD stage 2 due to type 2 diabetes mellitus (HCC) 09/04/2014   Health care maintenance 10/05/2013   Osteoarthritis of both knees 10/05/2013   Hyperlipidemia 12/25/2012   Anxiety and depression 12/28/2011   MIGRAINE HEADACHE 01/13/2008   GERD 01/13/2008   Essential hypertension 02/09/2007   Diabetes mellitus type 2 with retinopathy (HCC) 08/31/2004    PCP: Vernadine Charlie ORN, MD  REFERRING PROVIDER: Debby Dorn MATSU, MD  REFERRING DIAG: M54.16 (ICD-10-CM) - Radiculopathy, lumbar region  Rationale for Evaluation and Treatment: Rehabilitation  THERAPY DIAG:  Other low back pain  Muscle weakness (generalized)  ONSET DATE: 15+ years, worsening over 3-4 months w/ new pains  SUBJECTIVE:  Per eval: Pt reports chronic history of back pain 15+ years. States she is having a new pain R side low back into hip, down into posterior thigh and foot. States she would occasionally get paresthesias in leg historically but not all the way down to leg. Pt reports weight loss surgery prior to 2020, was more active and felt that helped. Feels this episode was preceded by reducing her activities, was previously in gym about 3 days a week. Feels it is gradually worsening since onset although she had an injection last week with excellent relief.  No L sided symptoms. No bowel/bladder symptoms, no saddle anesthesia. No fevers/chills.  SUBJECTIVE STATEMENT: Patient reports that her pain is increased to 10/10 pain with movement over the past  week. She states I was doing too much last week, but I had to for my work.   PERTINENT HISTORY:  anxiety/depression, DM2. GERD, headache, HTN  PAIN:  Are you having pain: none at rest, 0/10 with standing/walking  Per eval; Location/description: R sided low back down to foot Best-worst over past week: 0-6/10 - aggravating factors: transfers, lifting leg, getting on and off floor, sitting for prolonged periods, lifting, standing >10 min - Easing factors: injection, ice, heat, icy hot, pain patches    PRECAUTIONS: None  WEIGHT BEARING RESTRICTIONS: No  FALLS:  Has patient fallen in last 6 months? No  LIVING ENVIRONMENT: 1 level apt, lives w/ husband and son Pt states she does majority of housework Requires rest breaks, sitting No AD  OCCUPATION: retired, work part time as Midwife and helps client   PLOF: Independent  PATIENT GOALS: go back to gym, walk further  NEXT MD VISIT: unsure   OBJECTIVE:  Note: Objective measures were completed at Evaluation unless otherwise noted.  DIAGNOSTIC FINDINGS:  MRI 02/04/24 lumbar:  IMPRESSION: 1. Progressive broad-based disc protrusion at L4-L5 with bilateral facet arthropathy resulting in mild subarticular narrowing (right greater than left) and bilateral foraminal stenosis. 2. Leftward disc protrusion at L5-S1 with mild facet arthropathy, mild subarticular narrowing (worse on the left), and progressive severe left and moderate right foraminal stenosis. 3. Grade 1 anterolisthesis at L4-5, slightly progressed.  PATIENT SURVEYS:  ODI: 18/50; 36% (eval) ODI: 32% (05/02/24)  COGNITION: Overall cognitive status: Within functional limits for tasks assessed     SENSATION: LT intact BIL LE  POSTURE: lateral shift towards L  LUMBAR ROM:   AROM eval  Flexion Pressure at proximal shin, able to push to distal shin  Extension 100% but reproduces RLE pain  Right lateral flexion Above knee  Left lateral flexion Above knee   Right rotation 50% s  Left rotation 50% * no LE pain   (Blank rows = not tested) (Key: WFL = within functional limits not formally assessed, * = concordant pain, s = stiffness/stretching sensation, NT = not tested) Comment:    LOWER EXTREMITY MMT:    MMT Right eval Left eval  Hip flexion 4- 4+  Hip abduction (modified sitting) 5 5  Hip internal rotation 4 4+  Hip external rotation 4 4+  Knee flexion 4+ 5  Knee extension 5 5  Ankle dorsiflexion 5 5   (Blank rows = not tested) (Key: WFL = within functional limits not formally assessed, * = concordant pain, s = stiffness/stretching sensation, NT = not tested)  Comments:    LUMBAR SPECIAL TESTS:  Slump + RLE, - LLE  FUNCTIONAL TESTS:  5xSTS:  13.39sec w UE support, 13.34sec w/o UE support (eval) 12.54 sec  w/o UE support (05/02/24)  GAIT: Distance walked: within clinic Assistive device utilized: None Level of assistance: Complete Independence Comments: mildly reduced gait speed/cadence  TREATMENT DATE:   [facility] Adult PT Treatment:                                                DATE: 05/16/2024  Therapeutic Exercise: Supine lying, concurrent with MHP x LTR 2x30 LTR x10  Manual Therapy S/L STM  to R lumbar paraspinals, and QL   Modalities: MHP x 8 minutes concurrent with supine lying (Not billed) Seated e-stim IFC level 18 x 11 min applied to R lumbar mm                                                                                                                          PATIENT EDUCATION:  Education details: rationale for interventions, HEP  Person educated: Patient Education method: Explanation, Demonstration, Tactile cues, Verbal cues Education comprehension: verbalized understanding, returned demonstration, verbal cues required, tactile cues required, and needs further education     HOME EXERCISE PROGRAM: Access Code: [email or web address] URL: [email or web address] Date: 04/25/2024 Prepared by: Gellen  April Earnie Starring  Exercises - Supine Lower Trunk Rotation  - 1 x daily - 7 x weekly - 1 sets - 10 reps - Supine ITB Stretch with Strap  - 1 x daily - 7 x weekly - 2 sets - 30 sec hold - Supine Posterior Pelvic Tilt  - 1 x daily - 7 x weekly - 2 sets - 10 reps - 3-5 sec hold - Bent Knee Fallouts  - 1 x daily - 7 x weekly - 1 sets - 10 reps - Seated Sciatic Tensioner  - 2-3 x daily - 1 sets - 8-10 reps - Seated Hamstring Stretch  - 1 x daily - 7 x weekly - 2 sets - 30 sec hold - Seated Figure 4 Piriformis Stretch  - 1 x daily - 7 x weekly - 2 sets - 30 sec hold - Seated Piriformis Stretch  - 1 x daily - 7 x weekly - 2 sets - 30 sec hold - Clamshell with Resistance  - 1 x daily - 7 x weekly - 2 sets - 10 reps  ASSESSMENT:  CLINICAL IMPRESSION: 05/09/2024: Sheila Ferguson had poor tolerance of today's session d/t severe pain. She responded well to use of e-stim at end of session. She does report mm spasm following manual therapy.    Per eval: Patient is a pleasant 63 y.o. woman who was seen today for physical therapy evaluation and treatment for chronic back pain with exacerbation over past few months, now having distal RLE referral. States this is limiting her participation in household tasks, recreational activities, and community navigation. No red flags today. On exam she demonstrates concordant limitations in lumbar ROM with extension reproducing RLE symptoms. Also  demonstrates positive slump test RLE, mild RLE weakness, limitation in 5xSTS which is around cutoff score for fall risk (generally 12-14 sec). No adverse events, tolerates exam/HEP well overall. Recommend trial of skilled PT to address aforementioned deficits with aim of improving functional tolerance and reducing pain with typical activities. Pt departs today's session in no acute distress, all voiced concerns/questions addressed appropriately from PT perspective.      OBJECTIVE IMPAIRMENTS: Abnormal gait, decreased activity tolerance,  decreased endurance, decreased mobility, difficulty walking, decreased ROM, decreased strength, impaired perceived functional ability, improper body mechanics, postural dysfunction, and pain.     GOALS:   SHORT TERM GOALS: Target date: 04/30/2024  Pt will demonstrate appropriate understanding and performance of initially prescribed HEP in order to facilitate improved independence with management of symptoms.  Baseline: HEP established  05/02/24: has been doing the stretches Goal status: MET  2. Pt will report at least 25% improvement in overall pain levels over past week in order to facilitate improved tolerance to typical daily activities.   Baseline: 0-6/10  05/02/24: 0/10 today but had some pain for this past week  Goal status: PARTIALLY MET  LONG TERM GOALS: Target date: 05/28/2024   Pt will improve at least 20% on ODI in order to demonstrate improved perception of functional status due to symptoms.  Baseline: 36% 05/02/24: 32% Goal status: IN PROGRESS  2.  Pt will demonstrate full lumbar extension AROM without RLE symptoms in order to demonstrate improved tolerance to functional movement patterns.  Baseline: full w/ RLE symptoms Goal status: IN PROGRESS  3.  Pt will demonstrate symmetrical hip/knee MMT of at least 4+/5  in order to demonstrate improved strength for functional movements.  Baseline: see MMT chart above Goal status: IN PROGRESS  4. Pt will perform 5xSTS in </=10 sec in order to demonstrate reduced fall risk and improved functional independence. (MCID of 2.3sec)  Baseline: 13sec w/ and w/o UE support  05/02/24: 12 sec w/o UE support  Goal status: IN PROGRESS  5. Pt will report at least 50% decrease in overall pain levels in past week in order to facilitate improved tolerance to basic ADLs/mobility.   Baseline: 0-6/10  Goal status: IN PROGRESS   6. Pt will endorse ability to stand for up to 30 min w/ less than 3pt increase in pain on NPS in order to improve  tolerance to household tasks.   Baseline: <59min  Goal status: IN PROGRESS  PLAN:  PT FREQUENCY: 2x/week  PT DURATION: 8 weeks  PLANNED INTERVENTIONS: 97164- PT Re-evaluation, 97750- Physical Performance Testing, 97110-Therapeutic exercises, 97530- Therapeutic activity, 97112- Neuromuscular re-education, 97535- Self Care, [id number]- Manual therapy, [phone]- Gait training, [phone]- Aquatic Therapy, [phone]- Electrical stimulation (unattended), [phone] (1-2 muscles), [phone] (3+ muscles)- Dry Needling, Patient/Family education, Balance training, Stair training, Taping, Joint mobilization, Spinal mobilization, Cryotherapy, and Moist heat.  PLAN FOR NEXT SESSION: Review/update HEP PRN. Work on [employer] exercises as appropriate with emphasis on core stability, comfortable lumbar/hip mobility, nerve desensitization, LE strengthening. Symptom modification strategies as indicated/appropriate.    Marko Molt, PT, DPT  05/16/2024 2:08 PM

## 2024-05-23 ENCOUNTER — Ambulatory Visit

## 2024-05-23 DIAGNOSIS — M5459 Other low back pain: Secondary | ICD-10-CM | POA: Diagnosis not present

## 2024-05-23 DIAGNOSIS — M6281 Muscle weakness (generalized): Secondary | ICD-10-CM

## 2024-05-23 NOTE — Therapy (Signed)
 OUTPATIENT PHYSICAL THERAPY TREATMENT   Patient Name: Sheila Ferguson MRN: [id number] DOB:August 06, 1961, 63 y.o., female Today's Date: 05/23/2024  END OF SESSION:  PT End of Session - 05/23/24 0914     Visit Number 9    Number of Visits 17    Date for PT Re-Evaluation 05/28/24    Authorization Type [employer]    Authorization Time Period 05/02/24-05/31/24    Authorization - Visit Number 4    Authorization - Number of Visits 4    PT Start Time 0915    PT Stop Time 0953    PT Time Calculation (min) 38 min    Activity Tolerance Patient tolerated treatment well    Behavior During Therapy [facility] for tasks assessed/performed          Past Medical History:  Diagnosis Date   Allergy    Anal fissure    Anemia    Anxiety    Arthritis    Bradycardia    slow heart rate per pt - unsure of rate    Congenital stricture of esophagus    Depression    Diabetes mellitus type II    DIABETES MELLITUS, TYPE II, UNCONTROLLED 09/01/2007   04/10/13 eye exam neg proliferative DM retinopathy, +non proliferative DM retinopathy OS and OD +amblyopia OD    Dysmetabolic syndrome    Gall bladder disease    removed   GERD (gastroesophageal reflux disease)    Headache(784.0)    Heart murmur    Hemorrhoids    Hypertension    Neuromuscular disorder (HCC)    ruptured disc in back, burcitis    Sarcoid    Sleep apnea    no cpap    Thyroid disease    no meds needed at this time    Past Surgical History:  Procedure Laterality Date   ABDOMINAL HYSTERECTOMY     CHOLECYSTECTOMY     COLONOSCOPY  2008   nesxt is 08-07-18   ESOPHAGEAL DILATION     x2   LAPAROSCOPIC GASTRIC SLEEVE RESECTION N/A 08/14/2018   Procedure: LAPAROSCOPIC GASTRIC SLEEVE RESECTION, UPPER ENDO, ERAS PATHWAY;  Surgeon: Gladis Cough, MD;  Location: [facility];  Service: General;  Laterality: N/A;   OOPHORECTOMY     right, due to endometriosis   UPPER GASTROINTESTINAL ENDOSCOPY     Patient Active Problem List   Diagnosis  Date Noted   Need for 23-polyvalent pneumococcal polysaccharide vaccine 08/24/2018   Dehydration 08/21/2018   S/P laparoscopic sleeve gastrectomy Dec 2019 08/14/2018   Peripheral neuropathy 07/14/2018   'Light-for-dates' infant with signs of fetal malnutrition 03/24/2018   Immunity status testing 04/01/2017   Severe obesity (BMI >= 40) (HCC) 10/22/2016   Lumbar radiculopathy, chronic 05/06/2016   CKD stage 2 due to type 2 diabetes mellitus (HCC) 09/04/2014   Health care maintenance 10/05/2013   Osteoarthritis of both knees 10/05/2013   Hyperlipidemia 12/25/2012   Anxiety and depression 12/28/2011   MIGRAINE HEADACHE 01/13/2008   GERD 01/13/2008   Essential hypertension 02/09/2007   Diabetes mellitus type 2 with retinopathy (HCC) 08/31/2004    PCP: Vernadine Charlie ORN, MD  REFERRING PROVIDER: Debby Dorn MATSU, MD  REFERRING DIAG: M54.16 (ICD-10-CM) - Radiculopathy, lumbar region  Rationale for Evaluation and Treatment: Rehabilitation  THERAPY DIAG:  Other low back pain  Muscle weakness (generalized)  ONSET DATE: 15+ years, worsening over 3-4 months w/ new pains  SUBJECTIVE:  Per eval: Pt reports chronic history of back pain 15+ years. States she is having a new pain R side low back into hip, down into posterior thigh and foot. States she would occasionally get paresthesias in leg historically but not all the way down to leg. Pt reports weight loss surgery prior to 2020, was more active and felt that helped. Feels this episode was preceded by reducing her activities, was previously in gym about 3 days a week. Feels it is gradually worsening since onset although she had an injection last week with excellent relief.  No L sided symptoms. No bowel/bladder symptoms, no saddle anesthesia. No  fevers/chills.  SUBJECTIVE STATEMENT: Patient reports that she is feeling better today than she did last week, states pain is at a 3/10, but will be much higher by the end of the day. She says that it also gets very painful at night and she has a hard time sleeping from this.  PERTINENT HISTORY:  anxiety/depression, DM2. GERD, headache, HTN  PAIN:  Are you having pain: none at rest, 0/10 with standing/walking  Per eval; Location/description: R sided low back down to foot Best-worst over past week: 0-6/10 - aggravating factors: transfers, lifting leg, getting on and off floor, sitting for prolonged periods, lifting, standing >10 min - Easing factors: injection, ice, heat, icy hot, pain patches    PRECAUTIONS: None  WEIGHT BEARING RESTRICTIONS: No  FALLS:  Has patient fallen in last 6 months? No  LIVING ENVIRONMENT: 1 level apt, lives w/ husband and son Pt states she does majority of housework Requires rest breaks, sitting No AD  OCCUPATION: retired, work part time as Midwife and helps client   PLOF: Independent  PATIENT GOALS: go back to gym, walk further  NEXT MD VISIT: unsure   OBJECTIVE:  Note: Objective measures were completed at Evaluation unless otherwise noted.  DIAGNOSTIC FINDINGS:  MRI 02/04/24 lumbar:  IMPRESSION: 1. Progressive broad-based disc protrusion at L4-L5 with bilateral facet arthropathy resulting in mild subarticular narrowing (right greater than left) and bilateral foraminal stenosis. 2. Leftward disc protrusion at L5-S1 with mild facet arthropathy, mild subarticular narrowing (worse on the left), and progressive severe left and moderate right foraminal stenosis. 3. Grade 1 anterolisthesis at L4-5, slightly progressed.  PATIENT SURVEYS:  ODI: 18/50; 36% (eval) ODI: 32% (05/02/24)  COGNITION: Overall cognitive status: Within functional limits for tasks assessed     SENSATION: LT intact BIL LE  POSTURE: lateral shift towards  L  LUMBAR ROM:   AROM eval  Flexion Pressure at proximal shin, able to push to distal shin  Extension 100% but reproduces RLE pain  Right lateral flexion Above knee  Left lateral flexion Above knee  Right rotation 50% s  Left rotation 50% * no LE pain   (Blank rows = not tested) (Key: WFL = within functional limits not formally assessed, * = concordant pain, s = stiffness/stretching sensation, NT = not tested) Comment:    LOWER EXTREMITY MMT:    MMT Right eval Left eval  Hip flexion 4- 4+  Hip abduction (modified sitting) 5 5  Hip internal rotation 4 4+  Hip external rotation 4 4+  Knee flexion 4+ 5  Knee extension 5 5  Ankle dorsiflexion 5 5   (Blank rows = not tested) (Key: WFL = within functional limits not formally assessed, * = concordant pain, s = stiffness/stretching sensation, NT = not tested)  Comments:    LUMBAR SPECIAL TESTS:  Slump + RLE, - LLE  FUNCTIONAL TESTS:  5xSTS:  13.39sec w UE support, 13.34sec w/o UE support (eval) 12.54 sec w/o UE support (05/02/24)  GAIT: Distance walked: within clinic Assistive device utilized: None Level of assistance: Complete Independence Comments: mildly reduced gait speed/cadence  TREATMENT DATE:  [facility] Adult PT Treatment:                                       DATE: 05/23/24 Therapeutic Exercise: Nustep level 5 x 8 mins while gathering subjective info and planning session with patient Seated hamstring stretch 2x30 BIL Supine figure 4 stretch 2x30 BIL Seated pball flexion 3x10 each direction LTR x10 Neuromuscular re-ed: Seated pball press down ab set 10x5; oblique press downs 10x5 Standing hip abd 3x5 with focus on reducing QL activation   [facility] Adult PT Treatment:                                                DATE: 05/16/2024  Therapeutic Exercise: Supine lying, concurrent with MHP x LTR 2x30 LTR x10  Manual Therapy S/L STM  to R lumbar paraspinals, and QL   Modalities: MHP x 8 minutes concurrent  with supine lying (Not billed) Seated e-stim IFC level 18 x 11 min applied to R lumbar mm   PATIENT EDUCATION:  Education details: rationale for interventions, HEP  Person educated: Patient Education method: Explanation, Demonstration, Tactile cues, Verbal cues Education comprehension: verbalized understanding, returned demonstration, verbal cues required, tactile cues required, and needs further education     HOME EXERCISE PROGRAM: Access Code: [email or web address] URL: [email or web address] Date: 04/25/2024 Prepared by: Gellen April Earnie Starring  Exercises - Supine Lower Trunk Rotation  - 1 x daily - 7 x weekly - 1 sets - 10 reps - Supine ITB Stretch with Strap  - 1 x daily - 7 x weekly - 2 sets - 30 sec hold - Supine Posterior Pelvic Tilt  - 1 x daily - 7 x weekly - 2 sets - 10 reps - 3-5 sec hold - Bent Knee Fallouts  - 1 x daily - 7 x weekly - 1 sets - 10 reps - Seated Sciatic Tensioner  - 2-3 x daily - 1 sets - 8-10 reps - Seated Hamstring Stretch  - 1 x daily - 7 x weekly - 2 sets - 30 sec hold - Seated Figure 4 Piriformis Stretch  - 1 x daily - 7 x weekly - 2 sets - 30 sec hold - Seated Piriformis Stretch  - 1 x daily - 7 x weekly - 2 sets - 30 sec hold - Clamshell with Resistance  - 1 x daily - 7 x weekly - 2 sets - 10 reps  ASSESSMENT:  CLINICAL IMPRESSION: Patient presents to PT reporting improvement in her pain since last session, still notices a lot of pain when trying to sleep and by the end of the work day. Session today focused on core and proximal hip strengthening, stretching for hips and hamstrings, and improving general activity tolerance. She will benefit from increased focus on core strengthening and body mechanics in future sessions. Patient was able to tolerate all prescribed exercises with no adverse effects. Patient continues to benefit from skilled PT services and should be progressed as able to improve functional independence.   Per eval: Patient  is a  pleasant 64 y.o. woman who was seen today for physical therapy evaluation and treatment for chronic back pain with exacerbation over past few months, now having distal RLE referral. States this is limiting her participation in household tasks, recreational activities, and community navigation. No red flags today. On exam she demonstrates concordant limitations in lumbar ROM with extension reproducing RLE symptoms. Also demonstrates positive slump test RLE, mild RLE weakness, limitation in 5xSTS which is around cutoff score for fall risk (generally 12-14 sec). No adverse events, tolerates exam/HEP well overall. Recommend trial of skilled PT to address aforementioned deficits with aim of improving functional tolerance and reducing pain with typical activities. Pt departs today's session in no acute distress, all voiced concerns/questions addressed appropriately from PT perspective.      OBJECTIVE IMPAIRMENTS: Abnormal gait, decreased activity tolerance, decreased endurance, decreased mobility, difficulty walking, decreased ROM, decreased strength, impaired perceived functional ability, improper body mechanics, postural dysfunction, and pain.     GOALS:   SHORT TERM GOALS: Target date: 04/30/2024  Pt will demonstrate appropriate understanding and performance of initially prescribed HEP in order to facilitate improved independence with management of symptoms.  Baseline: HEP established  05/02/24: has been doing the stretches Goal status: MET  2. Pt will report at least 25% improvement in overall pain levels over past week in order to facilitate improved tolerance to typical daily activities.   Baseline: 0-6/10  05/02/24: 0/10 today but had some pain for this past week  Goal status: PARTIALLY MET  LONG TERM GOALS: Target date: 05/28/2024   Pt will improve at least 20% on ODI in order to demonstrate improved perception of functional status due to symptoms.  Baseline: 36% 05/02/24: 32% Goal status: IN  PROGRESS  2.  Pt will demonstrate full lumbar extension AROM without RLE symptoms in order to demonstrate improved tolerance to functional movement patterns.  Baseline: full w/ RLE symptoms Goal status: IN PROGRESS  3.  Pt will demonstrate symmetrical hip/knee MMT of at least 4+/5  in order to demonstrate improved strength for functional movements.  Baseline: see MMT chart above Goal status: IN PROGRESS  4. Pt will perform 5xSTS in </=10 sec in order to demonstrate reduced fall risk and improved functional independence. (MCID of 2.3sec)  Baseline: 13sec w/ and w/o UE support  05/02/24: 12 sec w/o UE support  Goal status: IN PROGRESS  5. Pt will report at least 50% decrease in overall pain levels in past week in order to facilitate improved tolerance to basic ADLs/mobility.   Baseline: 0-6/10  Goal status: IN PROGRESS   6. Pt will endorse ability to stand for up to 30 min w/ less than 3pt increase in pain on NPS in order to improve tolerance to household tasks.   Baseline: <64min  Goal status: IN PROGRESS  PLAN:  PT FREQUENCY: 2x/week  PT DURATION: 8 weeks  PLANNED INTERVENTIONS: 97164- PT Re-evaluation, 97750- Physical Performance Testing, 97110-Therapeutic exercises, 97530- Therapeutic activity, 97112- Neuromuscular re-education, 97535- Self Care, [id number]- Manual therapy, [phone]- Gait training, [phone]- Aquatic Therapy, [phone]- Electrical stimulation (unattended), [phone] (1-2 muscles), [id number] (3+ muscles)- Dry Needling, Patient/Family education, Balance training, Stair training, Taping, Joint mobilization, Spinal mobilization, Cryotherapy, and Moist heat.  PLAN FOR NEXT SESSION: Review/update HEP PRN. Work on [employer] exercises as appropriate with emphasis on core stability, comfortable lumbar/hip mobility, nerve desensitization, LE strengthening. Symptom modification strategies as indicated/appropriate.    Corean Pouch PTA  05/23/2024 9:53 AM

## 2024-05-27 DIAGNOSIS — G4733 Obstructive sleep apnea (adult) (pediatric): Secondary | ICD-10-CM | POA: Diagnosis not present

## 2024-05-30 ENCOUNTER — Ambulatory Visit: Admitting: Physical Therapy

## 2024-05-30 ENCOUNTER — Encounter: Payer: Self-pay | Admitting: Physical Therapy

## 2024-05-30 DIAGNOSIS — M5459 Other low back pain: Secondary | ICD-10-CM

## 2024-05-30 DIAGNOSIS — M6281 Muscle weakness (generalized): Secondary | ICD-10-CM

## 2024-05-30 NOTE — Therapy (Signed)
 OUTPATIENT PHYSICAL THERAPY TREATMENT   Patient Name: MILLER EDGINGTON MRN: [id number] DOB:03/22/1961, 63 y.o., female Today's Date: 05/30/2024  END OF SESSION:  PT End of Session - 05/30/24 0955     Visit Number 10    Number of Visits 17    Date for PT Re-Evaluation 05/28/24    Authorization Type [employer]    Authorization Time Period 05/02/24-05/31/24    PT Start Time 0915    PT Stop Time 0955    PT Time Calculation (min) 40 min    Activity Tolerance Patient tolerated treatment well    Behavior During Therapy [facility] for tasks assessed/performed           Past Medical History:  Diagnosis Date   Allergy    Anal fissure    Anemia    Anxiety    Arthritis    Bradycardia    slow heart rate per pt - unsure of rate    Congenital stricture of esophagus    Depression    Diabetes mellitus type II    DIABETES MELLITUS, TYPE II, UNCONTROLLED 09/01/2007   04/10/13 eye exam neg proliferative DM retinopathy, +non proliferative DM retinopathy OS and OD +amblyopia OD    Dysmetabolic syndrome    Gall bladder disease    removed   GERD (gastroesophageal reflux disease)    Headache(784.0)    Heart murmur    Hemorrhoids    Hypertension    Neuromuscular disorder (HCC)    ruptured disc in back, burcitis    Sarcoid    Sleep apnea    no cpap    Thyroid disease    no meds needed at this time    Past Surgical History:  Procedure Laterality Date   ABDOMINAL HYSTERECTOMY     CHOLECYSTECTOMY     COLONOSCOPY  2008   nesxt is 08-07-18   ESOPHAGEAL DILATION     x2   LAPAROSCOPIC GASTRIC SLEEVE RESECTION N/A 08/14/2018   Procedure: LAPAROSCOPIC GASTRIC SLEEVE RESECTION, UPPER ENDO, ERAS PATHWAY;  Surgeon: Gladis Cough, MD;  Location: [facility];  Service: General;  Laterality: N/A;   OOPHORECTOMY     right, due to endometriosis   UPPER GASTROINTESTINAL ENDOSCOPY     Patient Active Problem List   Diagnosis Date Noted   Need for 23-polyvalent pneumococcal polysaccharide vaccine  08/24/2018   Dehydration 08/21/2018   S/P laparoscopic sleeve gastrectomy Dec 2019 08/14/2018   Peripheral neuropathy 07/14/2018   'Light-for-dates' infant with signs of fetal malnutrition 03/24/2018   Immunity status testing 04/01/2017   Severe obesity (BMI >= 40) (HCC) 10/22/2016   Lumbar radiculopathy, chronic 05/06/2016   CKD stage 2 due to type 2 diabetes mellitus (HCC) 09/04/2014   Health care maintenance 10/05/2013   Osteoarthritis of both knees 10/05/2013   Hyperlipidemia 12/25/2012   Anxiety and depression 12/28/2011   MIGRAINE HEADACHE 01/13/2008   GERD 01/13/2008   Essential hypertension 02/09/2007   Diabetes mellitus type 2 with retinopathy (HCC) 08/31/2004    PCP: Vernadine Charlie ORN, MD  REFERRING PROVIDER: Debby Dorn MATSU, MD  REFERRING DIAG: M54.16 (ICD-10-CM) - Radiculopathy, lumbar region  Rationale for Evaluation and Treatment: Rehabilitation  THERAPY DIAG:  Other low back pain  Muscle weakness (generalized)  ONSET DATE: 15+ years, worsening over 3-4 months w/ new pains  SUBJECTIVE:  Feels therapy has been going good, doesn't really have much pain unless she does too much walking or activity in general. Moving during chores or sitting too long at work are the primary aggravators.  Per eval: Pt reports chronic history of back pain 15+ years. States she is having a new pain R side low back into hip, down into posterior thigh and foot. States she would occasionally get paresthesias in leg historically but not all the way down to leg. Pt reports weight loss surgery prior to 2020, was more active and felt that helped. Feels this episode was preceded by reducing her activities, was previously in gym about 3 days a week. Feels it is gradually worsening since onset although she had an  injection last week with excellent relief.  No L sided symptoms. No bowel/bladder symptoms, no saddle anesthesia. No fevers/chills.  SUBJECTIVE STATEMENT: Patient reports that she is feeling better today than she did last week, states pain is at a 3/10, but will be much higher by the end of the day. She says that it also gets very painful at night and she has a hard time sleeping from this.  PERTINENT HISTORY:  anxiety/depression, DM2. GERD, headache, HTN  PAIN:  Are you having pain: none at rest, 0/10 with standing/walking  Per eval; Location/description: R sided low back down to foot Best-worst over past week: 0-6/10 - aggravating factors: transfers, lifting leg, getting on and off floor, sitting for prolonged periods, lifting, standing >10 min - Easing factors: injection, ice, heat, icy hot, pain patches    PRECAUTIONS: None  WEIGHT BEARING RESTRICTIONS: No  FALLS:  Has patient fallen in last 6 months? No  LIVING ENVIRONMENT: 1 level apt, lives w/ husband and son Pt states she does majority of housework Requires rest breaks, sitting No AD  OCCUPATION: retired, work part time as Midwife and helps client   PLOF: Independent  PATIENT GOALS: go back to gym, walk further  NEXT MD VISIT: unsure   OBJECTIVE:  Note: Objective measures were completed at Evaluation unless otherwise noted.  DIAGNOSTIC FINDINGS:  MRI 02/04/24 lumbar:  IMPRESSION: 1. Progressive broad-based disc protrusion at L4-L5 with bilateral facet arthropathy resulting in mild subarticular narrowing (right greater than left) and bilateral foraminal stenosis. 2. Leftward disc protrusion at L5-S1 with mild facet arthropathy, mild subarticular narrowing (worse on the left), and progressive severe left and moderate right foraminal stenosis. 3. Grade 1 anterolisthesis at L4-5, slightly progressed.  PATIENT SURVEYS:  ODI: 18/50; 36% (eval) ODI: 32% (05/02/24)  COGNITION: Overall cognitive status:  Within functional limits for tasks assessed     SENSATION: LT intact BIL LE  POSTURE: lateral shift towards L  LUMBAR ROM:   AROM eval  Flexion Pressure at proximal shin, able to push to distal shin  Extension 100% but reproduces RLE pain  Right lateral flexion Above knee  Left lateral flexion Above knee  Right rotation 50% s  Left rotation 50% * no LE pain   (Blank rows = not tested) (Key: WFL = within functional limits not formally assessed, * = concordant pain, s = stiffness/stretching sensation, NT = not tested) Comment:    LOWER EXTREMITY MMT:    MMT Right eval 05/30/2024 Left eval 05/30/2024  Hip flexion 4- 4 4+ 4+  Hip abduction (modified sitting) 5 5 5 5   Hip internal rotation 4 4+ 4+ 5  Hip external rotation 4 4+ 4+ 5  Knee flexion 4+ 5 5 5   Knee extension 5 5 5  5  Ankle dorsiflexion 5 5 5     (Blank rows = not tested) (Key: WFL = within functional limits not formally assessed, * = concordant pain, s = stiffness/stretching sensation, NT = not tested)  Comments:    LUMBAR SPECIAL TESTS:  Slump + RLE, - LLE  FUNCTIONAL TESTS:  5xSTS:  13.39sec w UE support, 13.34sec w/o UE support (eval) 12.54 sec w/o UE support (05/02/24)  GAIT: Distance walked: within clinic Assistive device utilized: None Level of assistance: Complete Independence Comments: mildly reduced gait speed/cadence  TREATMENT DATE:  [facility] Adult PT Treatment:                                                DATE: 05/30/2024 Therapeutic Activity: Objective measures Goal assessment, functional testing Self Care: Pt education POC discussion   [facility] Adult PT Treatment:                                       DATE: 05/23/24 Therapeutic Exercise: Nustep level 5 x 8 mins while gathering subjective info and planning session with patient Seated hamstring stretch 2x30 BIL Supine figure 4 stretch 2x30 BIL Seated pball flexion 3x10 each direction LTR x10 Neuromuscular re-ed: Seated pball press down  ab set 10x5; oblique press downs 10x5 Standing hip abd 3x5 with focus on reducing QL activation   [facility] Adult PT Treatment:                                                DATE: 05/16/2024  Therapeutic Exercise: Supine lying, concurrent with MHP x LTR 2x30 LTR x10  Manual Therapy S/L STM  to R lumbar paraspinals, and QL   Modalities: MHP x 8 minutes concurrent with supine lying (Not billed) Seated e-stim IFC level 18 x 11 min applied to R lumbar mm   PATIENT EDUCATION:  Education details: rationale for interventions, HEP  Person educated: Patient Education method: Explanation, Demonstration, Tactile cues, Verbal cues Education comprehension: verbalized understanding, returned demonstration, verbal cues required, tactile cues required, and needs further education     HOME EXERCISE PROGRAM: Access Code: [email or web address] URL: [email or web address] Date: 04/25/2024 Prepared by: Gellen April Earnie Starring  Exercises - Supine Lower Trunk Rotation  - 1 x daily - 7 x weekly - 1 sets - 10 reps - Supine ITB Stretch with Strap  - 1 x daily - 7 x weekly - 2 sets - 30 sec hold - Supine Posterior Pelvic Tilt  - 1 x daily - 7 x weekly - 2 sets - 10 reps - 3-5 sec hold - Bent Knee Fallouts  - 1 x daily - 7 x weekly - 1 sets - 10 reps - Seated Sciatic Tensioner  - 2-3 x daily - 1 sets - 8-10 reps - Seated Hamstring Stretch  - 1 x daily - 7 x weekly - 2 sets - 30 sec hold - Seated Figure 4 Piriformis Stretch  - 1 x daily - 7 x weekly - 2 sets - 30 sec hold - Seated Piriformis Stretch  - 1 x daily - 7 x weekly - 2 sets - 30 sec hold - Clamshell with  Resistance  - 1 x daily - 7 x weekly - 2 sets - 10 reps  ASSESSMENT:  CLINICAL IMPRESSION: Pt attended physical therapy session for re-evaluation of LBP. Pt has met   goals and continues to work towards 4 others. Difficulties continue with strength levels, top end pain during activity, self perceived function, and standing tolerance. Pt  required minimal v/t cuing as well as no assistance for safe and appropriate performance of today's acti. While pt would benefit from further OPPT, pt requested d/c to take a break and focus on given education. Education was given to continue applying ADL education from previous sessions as well as performing HEP as prescribed with freedom to progress as tolerated using previous education on modification and exercise dosage. Further education was provided at d/c regarding a handout packet with postural and lifting information. Pt has displayed and verbalized competence regarding this education.    Per eval: Patient is a pleasant 63 y.o. woman who was seen today for physical therapy evaluation and treatment for chronic back pain with exacerbation over past few months, now having distal RLE referral. States this is limiting her participation in household tasks, recreational activities, and community navigation. No red flags today. On exam she demonstrates concordant limitations in lumbar ROM with extension reproducing RLE symptoms. Also demonstrates positive slump test RLE, mild RLE weakness, limitation in 5xSTS which is around cutoff score for fall risk (generally 12-14 sec). No adverse events, tolerates exam/HEP well overall. Recommend trial of skilled PT to address aforementioned deficits with aim of improving functional tolerance and reducing pain with typical activities. Pt departs today's session in no acute distress, all voiced concerns/questions addressed appropriately from PT perspective.      OBJECTIVE IMPAIRMENTS: Abnormal gait, decreased activity tolerance, decreased endurance, decreased mobility, difficulty walking, decreased ROM, decreased strength, impaired perceived functional ability, improper body mechanics, postural dysfunction, and pain.     GOALS:   SHORT TERM GOALS: Target date: 04/30/2024  Pt will demonstrate appropriate understanding and performance of initially prescribed HEP in  order to facilitate improved independence with management of symptoms.  Baseline: HEP established  05/02/24: has been doing the stretches Goal status: MET  2. Pt will report at least 25% improvement in overall pain levels over past week in order to facilitate improved tolerance to typical daily activities.   Baseline: 0-6/10  05/02/24: 0/10 today but had some pain for this past week  Goal status:MET 05/30/2024  LONG TERM GOALS: Target date: 05/28/2024   Pt will improve at least 20% on ODI in order to demonstrate improved perception of functional status due to symptoms.  Baseline: 36% 05/02/24: 32% 05/30/2024: 18/50 (36%) Goal status:ONGOING 05/30/2024  2.  Pt will demonstrate full lumbar extension AROM without RLE symptoms in order to demonstrate improved tolerance to functional movement patterns.  Baseline: full w/ RLE symptoms Goal status: MET 05/30/2024  3.  Pt will demonstrate symmetrical hip/knee MMT of at least 4+/5  in order to demonstrate improved strength for functional movements.  Baseline: see MMT chart above Goal status: ONGOING 05/30/2024  4. Pt will perform 5xSTS in </=10 sec in order to demonstrate reduced fall risk and improved functional independence. (MCID of 2.3sec)  Baseline: 13sec w/ and w/o UE support  05/02/24: 12 sec w/o UE support  Goal status: MET (10s) 05/30/2024  5. Pt will report at least 50% decrease in overall pain levels in past week in order to facilitate improved tolerance to basic ADLs/mobility.   Baseline: 0-6/10  Goal status:ONGOING (  0-8/10)  6. Pt will endorse ability to stand for up to 30 min w/ less than 3pt increase in pain on NPS in order to improve tolerance to household tasks.   Baseline: <61min  Goal status: ONGOING 41m  PLAN:  PT FREQUENCY: 2x/week  PT DURATION: 8 weeks  PLANNED INTERVENTIONS: 97164- PT Re-evaluation, 97750- Physical Performance Testing, 97110-Therapeutic exercises, 97530- Therapeutic activity, 97112- Neuromuscular  re-education, 97535- Self Care, [id number]- Manual therapy, [phone]- Gait training, [phone]- Aquatic Therapy, [phone]- Electrical stimulation (unattended), [phone] (1-2 muscles), [phone] (3+ muscles)- Dry Needling, Patient/Family education, Balance training, Stair training, Taping, Joint mobilization, Spinal mobilization, Cryotherapy, and Moist heat.  PLAN FOR NEXT SESSION: Review/update HEP PRN. Work on [employer] exercises as appropriate with emphasis on core stability, comfortable lumbar/hip mobility, nerve desensitization, LE strengthening. Symptom modification strategies as indicated/appropriate.    PHYSICAL THERAPY DISCHARGE SUMMARY  Visits from Start of Care: 10  Current functional level related to goals / functional outcomes: See assessment   Remaining deficits: See assessment   Education / Equipment: See assessment   Patient agrees to discharge. Patient goals were partially met. Patient is being discharged due to the patient's request.   Mabel Kiang, PT, DPT 05/30/2024, 9:56 AM

## 2024-06-06 DIAGNOSIS — M5416 Radiculopathy, lumbar region: Secondary | ICD-10-CM | POA: Diagnosis not present

## 2024-06-06 DIAGNOSIS — Z6841 Body Mass Index (BMI) 40.0 and over, adult: Secondary | ICD-10-CM | POA: Diagnosis not present
